# Patient Record
Sex: Female | Born: 1940
Health system: Southern US, Community
[De-identification: ages and names within clinical notes are randomized; demographics above are authoritative.]

## PROBLEM LIST (undated history)

## (undated) DIAGNOSIS — N183 Chronic kidney disease, stage 3 unspecified: Secondary | ICD-10-CM

## (undated) DIAGNOSIS — I10 Essential (primary) hypertension: Secondary | ICD-10-CM

## (undated) DIAGNOSIS — R55 Syncope and collapse: Secondary | ICD-10-CM

## (undated) DIAGNOSIS — I340 Nonrheumatic mitral (valve) insufficiency: Secondary | ICD-10-CM

## (undated) DIAGNOSIS — R040 Epistaxis: Secondary | ICD-10-CM

## (undated) DIAGNOSIS — Z9289 Personal history of other medical treatment: Secondary | ICD-10-CM

## (undated) DIAGNOSIS — I482 Chronic atrial fibrillation, unspecified: Secondary | ICD-10-CM

## (undated) DIAGNOSIS — M109 Gout, unspecified: Secondary | ICD-10-CM

## (undated) DIAGNOSIS — E785 Hyperlipidemia, unspecified: Secondary | ICD-10-CM

## (undated) HISTORY — PX: ABDOMINAL HYSTERECTOMY: SHX81

## (undated) HISTORY — DX: Syncope and collapse: R55

## (undated) HISTORY — DX: Chronic kidney disease, stage 3 unspecified: N18.30

## (undated) HISTORY — PX: TONSILLECTOMY: SUR1361

## (undated) HISTORY — PX: OOPHORECTOMY: SHX86

## (undated) HISTORY — PX: TOTAL VAGINAL HYSTERECTOMY: SHX2548

## (undated) HISTORY — DX: Nonrheumatic mitral (valve) insufficiency: I34.0

## (undated) HISTORY — DX: Personal history of other medical treatment: Z92.89

## (undated) HISTORY — DX: Chronic kidney disease, stage 3 (moderate): N18.3

## (undated) HISTORY — PX: APPENDECTOMY: SHX54

## (undated) HISTORY — DX: Chronic atrial fibrillation, unspecified: I48.20

---

## 2004-05-08 ENCOUNTER — Ambulatory Visit: Payer: Self-pay | Admitting: Gastroenterology

## 2009-11-13 ENCOUNTER — Ambulatory Visit: Payer: Self-pay | Admitting: Internal Medicine

## 2011-12-22 ENCOUNTER — Ambulatory Visit: Payer: Self-pay | Admitting: Internal Medicine

## 2012-05-16 ENCOUNTER — Ambulatory Visit: Payer: Self-pay | Admitting: Ophthalmology

## 2012-07-25 ENCOUNTER — Ambulatory Visit: Payer: Self-pay | Admitting: Ophthalmology

## 2013-01-04 ENCOUNTER — Ambulatory Visit: Payer: Self-pay | Admitting: Internal Medicine

## 2014-03-25 DIAGNOSIS — I10 Essential (primary) hypertension: Secondary | ICD-10-CM | POA: Insufficient documentation

## 2014-03-25 DIAGNOSIS — N951 Menopausal and female climacteric states: Secondary | ICD-10-CM | POA: Insufficient documentation

## 2014-03-25 DIAGNOSIS — E785 Hyperlipidemia, unspecified: Secondary | ICD-10-CM | POA: Insufficient documentation

## 2014-03-25 DIAGNOSIS — M109 Gout, unspecified: Secondary | ICD-10-CM | POA: Insufficient documentation

## 2014-04-25 ENCOUNTER — Ambulatory Visit: Payer: Self-pay | Admitting: Family Medicine

## 2014-11-04 DIAGNOSIS — I10 Essential (primary) hypertension: Secondary | ICD-10-CM | POA: Diagnosis not present

## 2014-11-25 DIAGNOSIS — E876 Hypokalemia: Secondary | ICD-10-CM | POA: Diagnosis not present

## 2014-11-25 DIAGNOSIS — Z79899 Other long term (current) drug therapy: Secondary | ICD-10-CM | POA: Diagnosis not present

## 2014-11-25 DIAGNOSIS — I10 Essential (primary) hypertension: Secondary | ICD-10-CM | POA: Diagnosis not present

## 2014-11-25 DIAGNOSIS — N183 Chronic kidney disease, stage 3 (moderate): Secondary | ICD-10-CM | POA: Diagnosis not present

## 2015-05-21 DIAGNOSIS — I10 Essential (primary) hypertension: Secondary | ICD-10-CM | POA: Diagnosis not present

## 2015-05-21 DIAGNOSIS — Z79899 Other long term (current) drug therapy: Secondary | ICD-10-CM | POA: Diagnosis not present

## 2015-05-29 DIAGNOSIS — I10 Essential (primary) hypertension: Secondary | ICD-10-CM | POA: Diagnosis not present

## 2015-05-29 DIAGNOSIS — N183 Chronic kidney disease, stage 3 (moderate): Secondary | ICD-10-CM | POA: Diagnosis not present

## 2015-05-29 DIAGNOSIS — J301 Allergic rhinitis due to pollen: Secondary | ICD-10-CM | POA: Diagnosis not present

## 2015-05-29 DIAGNOSIS — Z79899 Other long term (current) drug therapy: Secondary | ICD-10-CM | POA: Diagnosis not present

## 2015-11-20 DIAGNOSIS — N183 Chronic kidney disease, stage 3 (moderate): Secondary | ICD-10-CM | POA: Diagnosis not present

## 2015-11-20 DIAGNOSIS — I10 Essential (primary) hypertension: Secondary | ICD-10-CM | POA: Diagnosis not present

## 2015-11-20 DIAGNOSIS — Z79899 Other long term (current) drug therapy: Secondary | ICD-10-CM | POA: Diagnosis not present

## 2015-11-27 DIAGNOSIS — Z79899 Other long term (current) drug therapy: Secondary | ICD-10-CM | POA: Diagnosis not present

## 2015-11-27 DIAGNOSIS — N183 Chronic kidney disease, stage 3 unspecified: Secondary | ICD-10-CM | POA: Insufficient documentation

## 2015-11-27 DIAGNOSIS — Z0000 Encounter for general adult medical examination without abnormal findings: Secondary | ICD-10-CM | POA: Diagnosis not present

## 2015-11-27 DIAGNOSIS — I10 Essential (primary) hypertension: Secondary | ICD-10-CM | POA: Diagnosis not present

## 2016-04-07 ENCOUNTER — Emergency Department
Admission: EM | Admit: 2016-04-07 | Discharge: 2016-04-07 | Disposition: A | Discharge status: Home / Self Care | Payer: Commercial Managed Care - HMO | Attending: Emergency Medicine | Admitting: Emergency Medicine

## 2016-04-07 ENCOUNTER — Encounter: Payer: Self-pay | Admitting: Emergency Medicine

## 2016-04-07 DIAGNOSIS — R040 Epistaxis: Secondary | ICD-10-CM | POA: Insufficient documentation

## 2016-04-07 DIAGNOSIS — I10 Essential (primary) hypertension: Secondary | ICD-10-CM | POA: Diagnosis not present

## 2016-04-07 HISTORY — DX: Essential (primary) hypertension: I10

## 2016-04-07 MED ORDER — AMOXICILLIN-POT CLAVULANATE 875-125 MG PO TABS
1.0000 | ORAL_TABLET | Freq: Once | ORAL | Status: AC
  Administered 2016-04-07: 1 via ORAL
  Filled 2016-04-07: qty 1

## 2016-04-07 NOTE — ED Notes (Signed)
MD at bedside. 2 ml of air removed from nasal catheter.

## 2016-04-07 NOTE — ED Notes (Signed)
Nasal catheter came out of pt's nose. No bleeding at this time.

## 2016-04-07 NOTE — ED Notes (Signed)
Pt's nose began to bleed, MD administered air back into nose catheter.

## 2016-04-07 NOTE — ED Notes (Signed)
MD at bedside. 

## 2016-04-07 NOTE — ED Provider Notes (Signed)
[facility] Emergency Department Provider Note   First MD Initiated Contact with Patient 04/07/16 [id number]     (approximate)  I have reviewed the triage vital signs and the nursing notes.   HISTORY  Chief Complaint Epistaxis    HPI Paula Clark is a 75 y.o. female with history of hypertension presents to the emergency department via EMS with nosebleed. Patient admits to one hour of heavy bleeding from the right nostril. EMS gave aspirin in route however bleeding is still uncontrolled.   Past Medical History:  Diagnosis Date  . Hypertension     There are no active problems to display for this patient.   Past Surgical History:  Procedure Laterality Date  . APPENDECTOMY    . TONSILLECTOMY      Prior to Admission medications   Not on File    Allergies No known drug allergies No family history on file.  Social History Social History  Substance Use Topics  . Smoking status: Not on file  . Smokeless tobacco: Not on file  . Alcohol use Not on file    Review of Systems Constitutional: No fever/chills Eyes: No visual changes. ENT: No sore throat.Positive for nosebleeds Cardiovascular: Denies chest pain. Respiratory: Denies shortness of breath. Gastrointestinal: No abdominal pain.  No nausea, no vomiting.  No diarrhea.  No constipation. Genitourinary: Negative for dysuria. Musculoskeletal: Negative for back pain. Skin: Negative for rash. Neurological: Negative for headaches, focal weakness or numbness.  10-point ROS otherwise negative.  ____________________________________________   PHYSICAL EXAM:  VITAL SIGNS: ED Triage Vitals  Enc Vitals Group     BP 04/07/16 0154 (!) 194/92     Pulse Rate 04/07/16 0154 86     Resp 04/07/16 0154 18     Temp 04/07/16 0154 99.2 F (37.3 C)     Temp Source 04/07/16 0154 Oral     SpO2 04/07/16 0154 98 %     Weight 04/07/16 0151 140 lb (63.5 kg)     Height 04/07/16 0151 5\' 3"  (1.6 m)     Head  Circumference --      Peak Flow --      Pain Score --      Pain Loc --      Pain Edu? --      Excl. in [place]? --    Constitutional: Alert and oriented. Well appearing and in no acute distress. Eyes: Conjunctivae are normal. PERRL. EOMI. Head: Atraumatic. Nose: Brisk bleeding from the right nostril. Mouth/Throat: Mucous membranes are moist.  Oropharynx non-erythematous. Neck: No stridor.  No meningeal signs. Cardiovascular: Normal rate, regular rhythm. Good peripheral circulation. Grossly normal heart sounds. Respiratory: Normal respiratory effort.  No retractions. Lungs CTAB. Gastrointestinal: Soft and nontender. No distention.  Musculoskeletal: No lower extremity tenderness nor edema. No gross deformities of extremities. Neurologic:  Normal speech and language. No gross focal neurologic deficits are appreciated.  Skin:  Skin is warm, dry and intact. No rash noted. Psychiatric: Mood and affect are normal. Speech and behavior are normal.    PROCEDURES  Procedure(s) performed:   .Epistaxis Management Date/Time: 04/07/2016 10:30 PM Performed by: Gregor Hams Authorized by: Gregor Hams   Consent:    Consent obtained:  Verbal   Consent given by:  Patient   Risks discussed:  Infection, nasal injury and pain   Alternatives discussed:  Alternative treatment Anesthesia (see MAR for exact dosages):    Anesthesia method:  None Procedure details:    Treatment site:  R anterior   Treatment method:  Nasal balloon   Treatment complexity:  Extensive   Treatment episode: initial   Post-procedure details:    Assessment:  Bleeding stopped   Patient tolerance of procedure:  Tolerated well, no immediate complications       INITIAL IMPRESSION / ASSESSMENT AND PLAN / ED COURSE  Pertinent labs & imaging results that were available during my care of the patient were reviewed by me and considered in my medical decision making (see chart for details).  At approximately 3:30 AM  nasal balloon dislodged on its own nostril inspected with stable clot noted at the anterior nasal septum no evidence of bleeding at this time as such nasal balloon not reinserted.   Clinical Course    ____________________________________________  FINAL CLINICAL IMPRESSION(S) / ED DIAGNOSES  Final diagnoses:  Anterior epistaxis     MEDICATIONS GIVEN DURING THIS VISIT:  Medications  amoxicillin-clavulanate (AUGMENTIN) 875-125 MG per tablet 1 tablet (1 tablet Oral Given 04/07/16 0302)     NEW OUTPATIENT MEDICATIONS STARTED DURING THIS VISIT:  New Prescriptions   No medications on file    Modified Medications   No medications on file    Discontinued Medications   No medications on file     Note:  This document was prepared using Dragon voice recognition software and may include unintentional dictation errors.    Gregor Hams, MD 04/07/16 2232

## 2016-04-07 NOTE — ED Triage Notes (Signed)
Pt arrived via ems from home with complaints of uncontrolled bleeding beginning an hour ago. EMS gave afrin en route, bleeding is uncontrolled upon assessment. MD at bedside.

## 2016-04-07 NOTE — ED Notes (Addendum)
Bleeding remains controlled.

## 2016-04-09 DIAGNOSIS — R040 Epistaxis: Secondary | ICD-10-CM | POA: Diagnosis not present

## 2016-04-11 ENCOUNTER — Observation Stay
Admission: EM | Admit: 2016-04-11 | Discharge: 2016-04-15 | Disposition: A | Discharge status: Home / Self Care | Payer: Commercial Managed Care - HMO | Attending: Internal Medicine | Admitting: Internal Medicine

## 2016-04-11 DIAGNOSIS — Z7982 Long term (current) use of aspirin: Secondary | ICD-10-CM | POA: Insufficient documentation

## 2016-04-11 DIAGNOSIS — I10 Essential (primary) hypertension: Secondary | ICD-10-CM | POA: Insufficient documentation

## 2016-04-11 DIAGNOSIS — R2689 Other abnormalities of gait and mobility: Secondary | ICD-10-CM

## 2016-04-11 DIAGNOSIS — I4891 Unspecified atrial fibrillation: Secondary | ICD-10-CM

## 2016-04-11 DIAGNOSIS — Z87891 Personal history of nicotine dependence: Secondary | ICD-10-CM | POA: Diagnosis not present

## 2016-04-11 DIAGNOSIS — D649 Anemia, unspecified: Secondary | ICD-10-CM | POA: Diagnosis not present

## 2016-04-11 DIAGNOSIS — R05 Cough: Secondary | ICD-10-CM | POA: Diagnosis not present

## 2016-04-11 DIAGNOSIS — I481 Persistent atrial fibrillation: Secondary | ICD-10-CM | POA: Diagnosis not present

## 2016-04-11 DIAGNOSIS — E785 Hyperlipidemia, unspecified: Secondary | ICD-10-CM | POA: Diagnosis not present

## 2016-04-11 DIAGNOSIS — I160 Hypertensive urgency: Secondary | ICD-10-CM | POA: Diagnosis not present

## 2016-04-11 DIAGNOSIS — R040 Epistaxis: Secondary | ICD-10-CM | POA: Diagnosis not present

## 2016-04-11 DIAGNOSIS — E876 Hypokalemia: Secondary | ICD-10-CM

## 2016-04-11 DIAGNOSIS — R531 Weakness: Secondary | ICD-10-CM

## 2016-04-11 DIAGNOSIS — I4819 Other persistent atrial fibrillation: Secondary | ICD-10-CM

## 2016-04-11 DIAGNOSIS — R059 Cough, unspecified: Secondary | ICD-10-CM

## 2016-04-11 HISTORY — DX: Hyperlipidemia, unspecified: E78.5

## 2016-04-11 HISTORY — DX: Epistaxis: R04.0

## 2016-04-11 HISTORY — DX: Gout, unspecified: M10.9

## 2016-04-11 LAB — CBC
HEMATOCRIT: 34.5 % — AB (ref 35.0–47.0)
HEMOGLOBIN: 11.9 g/dL — AB (ref 12.0–16.0)
MCH: 30.7 pg (ref 26.0–34.0)
MCHC: 34.5 g/dL (ref 32.0–36.0)
MCV: 89.1 fL (ref 80.0–100.0)
Platelets: 404 10*3/uL (ref 150–440)
RBC: 3.88 MIL/uL (ref 3.80–5.20)
RDW: 14.3 % (ref 11.5–14.5)
WBC: 5.5 10*3/uL (ref 3.6–11.0)

## 2016-04-11 MED ORDER — AMOXICILLIN-POT CLAVULANATE 875-125 MG PO TABS
1.0000 | ORAL_TABLET | Freq: Once | ORAL | Status: AC
  Administered 2016-04-11: 1 via ORAL
  Filled 2016-04-11: qty 1

## 2016-04-11 MED ORDER — PHENYLEPHRINE HCL 10 % OP SOLN
Freq: Once | OPHTHALMIC | Status: DC
  Filled 2016-04-11: qty 10

## 2016-04-11 MED ORDER — SILVER NITRATE-POT NITRATE 75-25 % EX MISC
1.0000 | Freq: Once | CUTANEOUS | Status: DC
  Filled 2016-04-11: qty 1

## 2016-04-11 MED ORDER — PHENYLEPHRINE HCL 0.5 % NA SOLN
2.0000 [drp] | Freq: Once | NASAL | Status: AC
  Administered 2016-04-11: 18:00:00 via NASAL

## 2016-04-11 MED ORDER — AMOXICILLIN-POT CLAVULANATE 875-125 MG PO TABS: 1.0000 | ORAL_TABLET | Freq: Two times a day (BID) | ORAL | 0 refills | Status: DC

## 2016-04-11 MED ORDER — PHENYLEPHRINE HCL 0.5 % NA SOLN
NASAL | Status: AC
  Filled 2016-04-11: qty 15

## 2016-04-11 MED ORDER — OXYMETAZOLINE HCL 0.05 % NA SOLN
NASAL | Status: AC
  Filled 2016-04-11: qty 15

## 2016-04-11 NOTE — ED Notes (Signed)
Norman MD at bedside 

## 2016-04-11 NOTE — ED Triage Notes (Signed)
Pt presents via POV c/o epistaxis recurrent. Pt reports seen in ED last Tuesday with same complaints with bleeding controlled. Reports this morning at apx 0500 woke up with blood dripping down throat per pt report. Pt reports intermittent bleeding today unable to control.

## 2016-04-11 NOTE — ED Notes (Signed)
Report received from hunter. Pt ordered an antibiotic. Nosebleed currently controlled with nasal tampon

## 2016-04-11 NOTE — ED Provider Notes (Addendum)
[facility] Emergency Department Provider Note  ____________________________________________  Time seen: Approximately 6:20 PM  I have reviewed the triage vital signs and the nursing notes.   HISTORY  Chief Complaint Epistaxis    HPI Paula Clark is a 75 y.o. female a history of hypertension and recurrent epistaxis, on low-dose aspirin but not otherwise anticoagulated, presenting with epistaxis 4 today. The patient was seen here 04/07/16 with epistaxis, and her packing fell out while she was here, with no further bleeding so she was discharged home with ENT follow-up. She saw Dr. Vaughan Basta discharged her home with a plan for follow-up and possible cauterization, but none indicated at the time of her visit. This morning, she woke up at 5 AM with a nosebleed that was stopped with 20 minutes of manual pressure. Since then, she has had 3 additional nosebleeds, and the last one did not stop. The blood is coming out the left nare, minimal drainage into the throat.  She reports generalized weakness, but otherwise has not had any lightheadedness, syncope, or shortness of breath. No fevers.  The pt denies auto-trauma, or blowing her nose.   Past Medical History:  Diagnosis Date  . Hypertension     There are no active problems to display for this patient.   Past Surgical History:  Procedure Laterality Date  . APPENDECTOMY    . TONSILLECTOMY        Allergies Review of patient's allergies indicates no known allergies.  History reviewed. No pertinent family history.  Social History Social History  Substance Use Topics  . Smoking status: Former Games developer  . Smokeless tobacco: Never Used  . Alcohol use No    Review of Systems Constitutional: No fever/chills.  Lightheadedness or syncope. Eyes: No visual changes. ENT: No sore throat. No congestion or rhinorrhea. Positive epistaxis in the right [facility]. Cardiovascular: Denies chest pain. Denies  palpitations. Respiratory: Denies shortness of breath.  No cough. Gastrointestinal: No abdominal pain.  No nausea, no vomiting.  No diarrhea.  No constipation. Skin: Negative for rash. Neurological: Negative for headaches. No focal numbness, tingling or weakness.   10-point ROS otherwise negative.  ____________________________________________   PHYSICAL EXAM:  VITAL SIGNS: ED Triage Vitals [04/11/16 1804]  Enc Vitals Group     BP      Pulse      Resp      Temp      Temp src      SpO2      Weight 145 lb (65.8 kg)     Height  (1.6 m)     Head Circumference      Peak Flow      Pain Score      Pain Loc      Pain Edu?      Excl. in [place]?     Constitutional: Alert and oriented. Well appearing and in no acute distress. Answers questions appropriately. Eyes: Conjunctivae are normal.  EOMI. No scleral icterus. Head: Atraumatic. Nose: No congestion/rhinnorhea.Positive bleeding from the septum in the right there, which is moderate in flow and therefore I am unable to see the mucous membrane. No bleeding from the left neck. No obvious blood in the posterior pharynx. Mouth/Throat: Mucous membranes are moist.  Neck: No stridor.  Supple.   Cardiovascular: Fast rate Respiratory: Normal respiratory effort.  Musculoskeletal: No LE edema.  Neurologic:  A&Ox3.  Speech is clear.  Face and smile are symmetric.  EOMI.  Moves all extremities well. Skin:  Skin is  warm, dry and intact. No rash noted. Psychiatric: Mood and affect are normal. Speech and behavior are normal.  Normal judgement.  ____________________________________________   LABS (all labs ordered are listed, but only abnormal results are displayed)  Labs Reviewed  CBC   ____________________________________________  EKG ED ECG REPORT I, Eula Listen, the attending physician, personally viewed and interpreted this ECG.   Date: 04/11/2016  EKG Time: 1901  Rate: 89  Rhythm: atrial fibrillation  Axis: normal   Intervals:none  ST&T Change: No STEMI  ____________________________________________  RADIOLOGY  No results found.  ____________________________________________   PROCEDURES  Procedure(s) performed: None  Procedures  Critical Care performed: No ____________________________________________   INITIAL IMPRESSION / ASSESSMENT AND PLAN / ED COURSE  Pertinent labs & imaging results that were available during my care of the patient were reviewed by me and considered in my medical decision making (see chart for details).  75 y.o. female with a history of recurrent epistaxis presenting with epistaxis that was not treatable with direct pressure. This likely is an anterior nosebleed, and I do not see evidence of a posterior nosebleed. She does have some generalized weakness, and we will check her hemoglobin and hematocrit, but I suspect that the likelihood of acute anemia is very low. On arrival to the emergency department, the patient had a gauze in the right [place] Adam with nasal clips, and on removal, a large blood clot was removed and bleeding resume. We immediately applied pressure, had the patient blow her nose, and treated her with Neo-Synephrine. In addition, I placed a Rhino Rocket in the right nare with minimal leakage of blood around the packing. Given that she is not otherwise and a quite regularly, we will await 15 minutes to see if the bleeding around the packing stops, and if it does not I will remove it and pack her with Murocel.  She will need to go home on Augmentin, which she has tolerated before.  Procedure: Bleeding obvious in the right nare anteriorly; Ant [employer] place w/ 5cc air.  Pt tolerated procedure well. She did have some minimal leakage around the [employer] after completion.  ____________________________________________  FINAL CLINICAL IMPRESSION(S) / ED DIAGNOSES  Final diagnoses:  None    Clinical Course  Comment By Time  This time, the patient does not  appear to have any oozing or bleeding around the [employer]. If her laboratory studies are reassuring, her heart rate has normalized, and her EKG is normal, we'll plan to discharge home. Give her first dose of Augmentin here, and a prescription to go home with. Eula Listen, MD 10/01 1901    ----------------------------------------- 7:28 PM on 04/11/2016 -----------------------------------------  According to the patient's laboratory studies from care everywhere, her baseline hematocrit is 12.4. Today she is 11.9, which is only minimally lower. Her vital signs are stable, and she does not meet criteria for transfusion or admission. Plan discharge at this time. Return precautions as well as follow-up instructions were discussed, including antibiotic use to prevent infection.  Today's EKG does show that the patient has atrial fibrillation, which she states she has had before but is not anticoagulated. Unfortunate, we do not have previous EKGs in our system here. She takes a low-dose aspirin daily, but held it today due to the bleeding. She does not have a primary cardiologist. Given that the patient has had atrial fibrillation in the past, and is well rate controlled, and is asymptomatic, she will not require admission to the hospital. In addition, she is  not a candidate to initiate anticoagulation today given her multiple episodes of epistaxis. However, I do think that if she has no cardiologist and has not been in atrial fibrillation recently, she should be reestablished. I have spoken with Dr. Chancy Milroy, who will see the patient on Tuesday at 1 PM. The family and the patient are aware and will follow-up.  At this time, I'll plan to discharge the patient home. Return precautions and follow-up instructions were discussed.  ----------------------------------------- 8:35 PM on 04/11/2016 -----------------------------------------  The patient was discharged without any evidence of acute bleeding,  and when she got in the car she turned her head and began to have some oozing around the [employer] site. On my reevaluation, she did have some minimal oozing, so I deflated the balloon and removed the [employer]. We cleared her nose again, and a placed Merocel in the right nare without any complications. I have additionally added a clip and will await 20 minutes to reevaluate the patient. At that time, if she continues to bleed, I will page ENT. If not, she'll be discharged home with the same previous plan for ENT follow-up as well as antibiotics.  ----------------------------------------- 11:14 PM on 04/11/2016 -----------------------------------------  The patient remains hemodynamically stable at this time. Unfortunately, the patient now has started oozing through her Merocel as well. I will call ENT for further treatment.  The patient has been signed out to Dr. Dahlia Client, who will follow with ENT for final treatment and disposition.  NEW MEDICATIONS STARTED DURING THIS VISIT:  New Prescriptions   No medications on file      Eula Listen, MD 04/11/16 1939    Eula Listen, MD 04/11/16 [id number]    Eula Listen, MD 04/11/16 2336

## 2016-04-11 NOTE — ED Notes (Signed)
Pt was discharged and while being placed in the car, turned her head and her nose started bleeding again. Pt brought back to room 24 and dr. Mariea Clonts made aware

## 2016-04-12 ENCOUNTER — Observation Stay: Payer: Commercial Managed Care - HMO

## 2016-04-12 DIAGNOSIS — R040 Epistaxis: Secondary | ICD-10-CM | POA: Diagnosis not present

## 2016-04-12 DIAGNOSIS — I160 Hypertensive urgency: Secondary | ICD-10-CM | POA: Diagnosis present

## 2016-04-12 DIAGNOSIS — R05 Cough: Secondary | ICD-10-CM | POA: Diagnosis not present

## 2016-04-12 DIAGNOSIS — I10 Essential (primary) hypertension: Secondary | ICD-10-CM | POA: Diagnosis not present

## 2016-04-12 LAB — CBC
HEMATOCRIT: 33.1 % — AB (ref 35.0–47.0)
Hemoglobin: 11.5 g/dL — ABNORMAL LOW (ref 12.0–16.0)
MCH: 30.2 pg (ref 26.0–34.0)
MCHC: 34.7 g/dL (ref 32.0–36.0)
MCV: 87 fL (ref 80.0–100.0)
Platelets: 417 10*3/uL (ref 150–440)
RBC: 3.8 MIL/uL (ref 3.80–5.20)
RDW: 14.6 % — ABNORMAL HIGH (ref 11.5–14.5)
WBC: 6.1 10*3/uL (ref 3.6–11.0)

## 2016-04-12 LAB — BASIC METABOLIC PANEL
Anion gap: 7 (ref 5–15)
BUN: 14 mg/dL (ref 6–20)
CHLORIDE: 110 mmol/L (ref 101–111)
CO2: 23 mmol/L (ref 22–32)
Calcium: 9.2 mg/dL (ref 8.9–10.3)
Creatinine, Ser: 1.03 mg/dL — ABNORMAL HIGH (ref 0.44–1.00)
GFR calc non Af Amer: 51 mL/min — ABNORMAL LOW (ref >60)
GFR, EST AFRICAN AMERICAN: 59 mL/min — AB (ref >60)
Glucose, Bld: 122 mg/dL — ABNORMAL HIGH (ref 65–99)
POTASSIUM: 3.6 mmol/L (ref 3.5–5.1)
SODIUM: 140 mmol/L (ref 135–145)

## 2016-04-12 MED ORDER — ACETAMINOPHEN 650 MG RE SUPP: 650.0000 mg | Freq: Four times a day (QID) | RECTAL | Status: DC | PRN

## 2016-04-12 MED ORDER — AMLODIPINE BESYLATE 5 MG PO TABS
5.0000 mg | ORAL_TABLET | Freq: Every day | ORAL | Status: DC
  Administered 2016-04-12: 5 mg via ORAL
  Filled 2016-04-12: qty 1

## 2016-04-12 MED ORDER — MAGNESIUM CITRATE PO SOLN: 1.0000 | Freq: Once | ORAL | Status: DC | PRN

## 2016-04-12 MED ORDER — HYDROCHLOROTHIAZIDE 12.5 MG PO CAPS
12.5000 mg | ORAL_CAPSULE | Freq: Every day | ORAL | Status: DC
  Administered 2016-04-12: 12.5 mg via ORAL
  Filled 2016-04-12: qty 1

## 2016-04-12 MED ORDER — BISACODYL 5 MG PO TBEC: 5.0000 mg | DELAYED_RELEASE_TABLET | Freq: Every day | ORAL | Status: DC | PRN

## 2016-04-12 MED ORDER — HYDROCODONE-ACETAMINOPHEN 5-325 MG PO TABS: 1.0000 | ORAL_TABLET | ORAL | Status: DC | PRN

## 2016-04-12 MED ORDER — METOPROLOL SUCCINATE ER 50 MG PO TB24
100.0000 mg | ORAL_TABLET | Freq: Every day | ORAL | Status: DC
  Administered 2016-04-12 – 2016-04-13 (×2): 100 mg via ORAL
  Filled 2016-04-12 (×2): qty 2

## 2016-04-12 MED ORDER — ACETAMINOPHEN 325 MG PO TABS
650.0000 mg | ORAL_TABLET | Freq: Four times a day (QID) | ORAL | Status: DC | PRN
  Administered 2016-04-12 – 2016-04-14 (×4): 650 mg via ORAL
  Filled 2016-04-12 (×4): qty 2

## 2016-04-12 MED ORDER — ONDANSETRON HCL 4 MG/2ML IJ SOLN: 4.0000 mg | Freq: Four times a day (QID) | INTRAMUSCULAR | Status: DC | PRN

## 2016-04-12 MED ORDER — SODIUM CHLORIDE 0.9% FLUSH
3.0000 mL | Freq: Two times a day (BID) | INTRAVENOUS | Status: DC
  Administered 2016-04-12 – 2016-04-15 (×4): 3 mL via INTRAVENOUS

## 2016-04-12 MED ORDER — HYDRALAZINE HCL 20 MG/ML IJ SOLN
2.0000 mg | Freq: Once | INTRAMUSCULAR | Status: AC
  Administered 2016-04-12: 2 mg via INTRAVENOUS
  Filled 2016-04-12: qty 1

## 2016-04-12 MED ORDER — ZOLPIDEM TARTRATE 5 MG PO TABS: 5.0000 mg | ORAL_TABLET | Freq: Every evening | ORAL | Status: DC | PRN

## 2016-04-12 MED ORDER — CALCIUM CARBONATE-VITAMIN D 500-200 MG-UNIT PO TABS
1.0000 | ORAL_TABLET | Freq: Every day | ORAL | Status: DC
  Administered 2016-04-12 – 2016-04-15 (×4): 1 via ORAL
  Filled 2016-04-12 (×4): qty 1

## 2016-04-12 MED ORDER — METOPROLOL TARTRATE 5 MG/5ML IV SOLN
5.0000 mg | Freq: Once | INTRAVENOUS | Status: AC
  Administered 2016-04-12: 5 mg via INTRAVENOUS
  Filled 2016-04-12: qty 5

## 2016-04-12 MED ORDER — SODIUM CHLORIDE 0.9 % IV SOLN
INTRAVENOUS | Status: DC
  Administered 2016-04-12 (×2): via INTRAVENOUS

## 2016-04-12 MED ORDER — ONDANSETRON HCL 4 MG PO TABS: 4.0000 mg | ORAL_TABLET | Freq: Four times a day (QID) | ORAL | Status: DC | PRN

## 2016-04-12 MED ORDER — SENNOSIDES-DOCUSATE SODIUM 8.6-50 MG PO TABS: 1.0000 | ORAL_TABLET | Freq: Every evening | ORAL | Status: DC | PRN

## 2016-04-12 MED ORDER — DOXAZOSIN MESYLATE 4 MG PO TABS
8.0000 mg | ORAL_TABLET | Freq: Every day | ORAL | Status: DC
  Administered 2016-04-12 – 2016-04-15 (×4): 8 mg via ORAL
  Filled 2016-04-12 (×4): qty 2

## 2016-04-12 NOTE — Care Management Note (Signed)
Case Management Note  Patient Details  Name: LANORE RENDEROS MRN: [id number] Date of Birth: [birth year]  Subjective/Objective:                   Met with patient and her daughter around 74AM today to explain MOON letter (Medicare OBServation). I returned at 11:45AM and patient was sleeping and had not signed MOON. Per daughter patient did not feel well enough to read form and asked that I go over it again which I did. Copy left with daughter. Per daughter patient has been having nose bleeds for about a week now and blood pressure problem (high and low) for over 5 months. She states her PCP is with [facility] and "cannot get her cardiac medications right".  Daughter wants her to see a cardiologist. She has never had home health services.   Action/Plan:   I advised daughter to call and cancel her PCP appointment that patient had today and reschedule it for about 7-10 days out. I advised here to  ask about cardiology referral for a-fib.   Expected Discharge Date:                  Expected Discharge Plan:     In-House Referral:     Discharge planning Services  CM Consult  Post Acute Care Choice:    Choice offered to:  Adult Children, Patient  DME Arranged:    DME Agency:     HH Arranged:    HH Agency:     Status of Service:  In process, will continue to follow  If discussed at Long Length of Stay Meetings, dates discussed:    Additional Comments:  Marshell Garfinkel, RN 04/12/2016, 11:52 AM

## 2016-04-12 NOTE — H&P (Addendum)
Paula Clark @ [facility] Admission History and Physical [employer], D.O.  ---------------------------------------------------------------------------------------------------------------------   PATIENT NAME: Paula Clark MR#: [id number] DATE OF BIRTH: July 05, 1941 DATE OF ADMISSION: 04/11/2016 PRIMARY CARE PHYSICIAN: No PCP Per Patient  REQUESTING/REFERRING PHYSICIAN: ED Dr. Dahlia Client  CHIEF COMPLAINT: Chief Complaint  Patient presents with  . Epistaxis   epistaxis  HISTORY OF PRESENT ILLNESS: Paula Clark is a 75 y.o. female with a known history of hypertension presents to the emergency department complaining of nosebleed.  Patient was in a usual state of health until Last week when she developed epistaxis. She was seen in the emergency department, packed and discharged home with ENT follow-up. She reports a nosebleed this morning that stopped with manual pressure but has since had 3 additional nosebleeds which did not stop prompting an emergency department visit today. She was seen again, discharged again but when she got in her car, bleeding resumed.  She was seen in the emergency department by ENT who packed her nose and recommended admission for observation, BP control and consideration of cauterization if bleeding persists once she is normotensive.  My questioning she reports a mild cough associated with dripping down her throat, generalized weakness. Of note she reports that her blood pressure medications have been lowered about 5 months ago because her blood pressure was dropping too low.  Otherwise there has been no change in status. Patient has been taking medication as prescribed and there has been no recent change in medication or diet.  There has been no recent illness, travel or sick contacts.    Patient denies fevers/chills, dizziness, chest pain, shortness of breath, N/V/C/D, abdominal pain, dysuria/frequency, changes in mental status.    PAST MEDICAL  HISTORY: Past Medical History:  Diagnosis Date  . Hypertension       PAST SURGICAL HISTORY: Past Surgical History:  Procedure Laterality Date  . APPENDECTOMY    . TONSILLECTOMY        SOCIAL HISTORY: Social History  Substance Use Topics  . Smoking status: Former Research scientist (life sciences)  . Smokeless tobacco: Never Used  . Alcohol use No      FAMILY HISTORY: History reviewed. No pertinent family history.   MEDICATIONS AT HOME: Prior to Admission medications   Medication Sig Start Date End Date Taking? Authorizing Provider  aspirin EC 81 MG tablet Take 81 mg by mouth daily.   Yes Historical Provider, MD  Calcium Carbonate-Vitamin D 600-400 MG-UNIT tablet Take 1 tablet by mouth daily.   Yes Historical Provider, MD  doxazosin (CARDURA) 8 MG tablet Take 1 tablet by mouth daily. 03/01/16  Yes Historical Provider, MD  metoprolol succinate (TOPROL-XL) 50 MG 24 hr tablet Take 1 tablet by mouth daily. 02/19/16  Yes Historical Provider, MD  amoxicillin-clavulanate (AUGMENTIN) 875-125 MG tablet Take 1 tablet by mouth every 12 (twelve) hours. 04/11/16 04/21/16  Eula Listen, MD      DRUG ALLERGIES: No Known Allergies   REVIEW OF SYSTEMS: CONSTITUTIONAL: Positive weakness, no fever, chills, weight gain/loss, headache EYES: No blurry or double vision.Positive nosebleed.  ENT: No tinnitus, postnasal drip, redness or soreness of the oropharynx. RESPIRATORY: No dyspnea, positive mild cough, wheeze, hemoptysis. CARDIOVASCULAR: No chest pain, orthopnea, palpitations, syncope. GASTROINTESTINAL: No nausea, vomiting, constipation, diarrhea, abdominal pain. No hematemesis, melena or hematochezia. GENITOURINARY: No dysuria, frequency, hematuria. ENDOCRINE: No polyuria or nocturia. No heat or cold intolerance. HEMATOLOGY: No anemia, bruising, bleeding. INTEGUMENTARY: No rashes, ulcers, lesions. MUSCULOSKELETAL: No pain, arthritis, swelling, gout. NEUROLOGIC: No numbness, tingling, weakness or ataxia.  No seizure-type activity. PSYCHIATRIC: No anxiety, depression, insomnia.  PHYSICAL EXAMINATION: VITAL SIGNS: Blood pressure (!) 170/91, pulse 78, temperature 99 F (37.2 C), temperature source Oral, resp. rate 20, height 5\' 3"  (1.6 m), weight 65.8 kg (145 lb), SpO2 98 %.  GENERAL: 75 y.o.-year-old white female  patient, well-developed, well-nourished lying in the bed in no acute distress.  Pleasant and cooperative.   HEENT: Head atraumatic, normocephalic. Pupils equal, round, reactive to light and accommodation. No scleral icterus. Extraocular muscles intact. Oropharynx is clear. Mucus membranes moist. Right nare is packed and there is a nasal clamp NECK: Supple, full range of motion. No JVD, no bruit heard. No cervical lymphadenopathy. CHEST: Normal breath sounds bilaterally. No wheezing, rales, rhonchi or crackles. No use of accessory muscles of respiration.  No reproducible chest wall tenderness.  CARDIOVASCULAR: S1, S2 normal. No murmurs, rubs, or gallops appreciated. Cap refill <2 seconds. ABDOMEN: Soft, nontender, nondistended. No rebound, guarding, rigidity. Normoactive bowel sounds present in all four quadrants. No organomegaly or mass. EXTREMITIES: Full range of motion. No pedal edema, cyanosis, or clubbing. NEUROLOGIC: Cranial nerves II through XII are grossly intact with no focal sensorimotor deficit. Muscle strength 5/5 in all extremities. Sensation intact. Gait not checked. PSYCHIATRIC: The patient is alert and oriented x 3. Normal affect, mood, thought content. SKIN: Warm, dry, and intact without obvious rash, lesion, or ulcer.  LABORATORY PANEL:  CBC  Recent Labs Lab 04/11/16 1853  WBC 5.5  HGB 11.9*  HCT 34.5*  PLT 404   ----------------------------------------------------------------------------------------------------------------- Chemistries No results for input(s): NA, K, CL, CO2, GLUCOSE, BUN, CREATININE, CALCIUM, MG, AST, ALT, ALKPHOS, BILITOT in the last 168  hours.  Invalid input(s): GFRCGP ------------------------------------------------------------------------------------------------------------------ Cardiac Enzymes No results for input(s): TROPONINI in the last 168 hours. ------------------------------------------------------------------------------------------------------------------  RADIOLOGY: No results found.   IMPRESSION AND PLAN:  This is a 75 y.o. female with a history of Hypertension, recurrent epistaxis now being admitted with: 1. Hypertensive urgency -admit for observation, BP control and ENT consult. We'll continue her Cardura, increase her Toprol XL to 100 mg daily and monitor her on telemetry.  I will order a one time dose of hydralazine now.   2. Intractable epistaxis-packed by ENT. I will request an EKG and chest x-ray as well for baseline for admission for preoperative evaluation for consideration of cauterization. Hold aspirin for tonight and resume when safe.  3. Cough-likely secondary to postnasal drip. We'll monitor  Please note there is documentation of an EKG with atrial fibrillation - I did not see this EKG, but have ordered a repeat as this would be a new diagnosis for the patient and may be contributing to her increased BP.    Diet/Nutrition: Nothing by mouth pending possible OR Fluids:IV normal saline DVT Px: SCDs and early ambulation Code Status: Full  All the records are reviewed and case discussed with ED provider. Management plans discussed with the patient and/or family who express understanding and agree with plan of care.   TOTAL TIME TAKING CARE OF THIS PATIENT: 60 minutes.   Paula Clark D.O. on 04/12/2016 at 1:39 AM Between 7am to 6pm - Pager - [phone] After 6pm go to [email or web address] - [employer] [facility] Office [phone] CC: Primary care physician; No PCP Per Patient     Note: This dictation was prepared with Dragon dictation along with  smaller phrase technology. Any transcriptional errors that result from this process are unintentional.

## 2016-04-12 NOTE — ED Provider Notes (Signed)
-----------------------------------------   1:10 AM on 04/12/2016 -----------------------------------------   Blood pressure (!) 167/87, pulse (!) 103, resp. rate 18, height 5\' 3"  (1.6 m), weight 145 lb (65.8 kg), SpO2 97 %.  Assuming care from Dr. Mariea Clonts.  In short, Paula Clark is a 75 y.o. female with a chief complaint of Epistaxis .  Refer to the original H&P for additional details.  The current plan of care is to follow up the recommendations by the ENT specialist.  The patient was seen by Dr. Kathyrn Sheriff. He feels that the patient has an anterior nosebleed and some of this bleeding is due to exertion and her elevated blood pressure. He would like for me to give her some medication to lower her blood pressure as well as admit her for observation overnight. He wants [name] to continue to work on her blood pressure and observe her overnight. I will give the patient a dose of Lopressor 5 mg IV 1 and I will admit her to the hospitalist service. After the initial dose of Lopressor the patient's blood pressure did not change. She will receive a second dose of Lopressor.    Loney Hering, MD 04/12/16 [phone]

## 2016-04-12 NOTE — Consult Note (Signed)
Paula Clark, Paula Clark [id number] 04-Oct-1940 Vaughan Basta, MD  Reason for Consult: Epistaxis  HPI: Patient was seen last night emergency room and taken care of for epistaxis. She had bleeding finally controlled with packing and digital pressure on her anterior nose. Her bleeding has been coming from the anterior septum on the right side. Her blood pressure was uncontrolled she was admitted in the hospital last night and then on further blood pressure medications. I see this morning she feels lousy and is noted that her last blood pressure was 200/100. She has remained with packing in and a clip on her nose and has not done any further bleeding since last night.  Allergies: No Known Allergies  ROS: Review of systems normal other than 12 systems except per HPI.  PMH:  Past Medical History:  Diagnosis Date  . Hypertension     FH: History reviewed. No pertinent family history.  SH:  Social History   Social History  . Marital status: Widowed    Spouse name: N/A  . Number of children: N/A  . Years of education: N/A   Occupational History  . Not on file.   Social History Main Topics  . Smoking status: Former Research scientist (life sciences)  . Smokeless tobacco: Never Used  . Alcohol use No  . Drug use: No  . Sexual activity: Not on file   Other Topics Concern  . Not on file   Social History Narrative  . No narrative on file    PSH:  Past Surgical History:  Procedure Laterality Date  . APPENDECTOMY    . TONSILLECTOMY      Physical  Exam: Patient has the Merocel sponge in her right nostril and a nasal clip on the tip of the nose. She is not doing any active bleeding from her nose nor down the back of her throat. There is no swelling in her face or any signs of infection.   A/P: She has uncontrolled hypertension currently that is been feeling this nosebleed. The packing and nasal clip are working okay right now and once the blood pressure is better controlled the nasal clip removed. We'll plan to  keep the pack in all week long and to see her in the office Friday morning for packing removal. Until the blood pressure is controlled she will likely continue to have some nosebleed problems. She may need stay in the hospital little longer to make sure the blood pressure is managed well. I will plan to see her in the office Friday but call me back for any further signs of uncontrolled epistaxis in the meantime.   Edward Guthmiller H 04/12/2016 9:30 AM

## 2016-04-12 NOTE — Progress Notes (Signed)
[facility] [place] NAME: Paula Clark    MR#:  [id number]  DATE OF BIRTH:  Mar 01, 1941  SUBJECTIVE:  CHIEF COMPLAINT:   Chief Complaint  Patient presents with  . Epistaxis     Came with repeated Epistaxis and uncontrolled hypertension.   Have bleed from nose on coughing.  REVIEW OF SYSTEMS:  CONSTITUTIONAL: No fever, fatigue or weakness.  EYES: No blurred or double vision.  EARS, NOSE, AND THROAT: No tinnitus or ear pain.  RESPIRATORY: No cough, shortness of breath, wheezing or hemoptysis.  CARDIOVASCULAR: No chest pain, orthopnea, edema.  GASTROINTESTINAL: No nausea, vomiting, diarrhea or abdominal pain.  GENITOURINARY: No dysuria, hematuria.  ENDOCRINE: No polyuria, nocturia,  HEMATOLOGY: No anemia, easy bruising or bleeding SKIN: No rash or lesion. MUSCULOSKELETAL: No joint pain or arthritis.   NEUROLOGIC: No tingling, numbness, weakness.  PSYCHIATRY: No anxiety or depression.   ROS  DRUG ALLERGIES:  No Known Allergies  VITALS:  Blood pressure (!) 181/75, pulse 81, temperature 98.7 F (37.1 C), temperature source Oral, resp. rate 20, height 5\' 3"  (1.6 m), weight 65.9 kg (145 lb 3.2 oz), SpO2 94 %.  PHYSICAL EXAMINATION:  GENERAL:  75 y.o.-year-old patient lying in the bed with no acute distress.  EYES: Pupils equal, round, reactive to light and accommodation. No scleral icterus. Extraocular muscles intact.  HEENT: Head atraumatic, normocephalic. Oropharynx clear, nasal packing- stained with blood present. NECK:  Supple, no jugular venous distention. No thyroid enlargement, no tenderness.  LUNGS: Normal breath sounds bilaterally, no wheezing, rales,rhonchi or crepitation. No use of accessory muscles of respiration.  CARDIOVASCULAR: S1, S2 normal. No murmurs, rubs, or gallops.  ABDOMEN: Soft, nontender, nondistended. Bowel sounds present. No organomegaly or mass.  EXTREMITIES: No pedal edema, cyanosis, or clubbing.   NEUROLOGIC: Cranial nerves II through XII are intact. Muscle strength 5/5 in all extremities. Sensation intact. Gait not checked.  PSYCHIATRIC: The patient is alert and oriented x 3.  SKIN: No obvious rash, lesion, or ulcer.   Physical Exam LABORATORY PANEL:   CBC  Recent Labs Lab 04/12/16 0338  WBC 6.1  HGB 11.5*  HCT 33.1*  PLT 417   ------------------------------------------------------------------------------------------------------------------  Chemistries   Recent Labs Lab 04/12/16 0338  NA 140  K 3.6  CL 110  CO2 23  GLUCOSE 122*  BUN 14  CREATININE 1.03*  CALCIUM 9.2   ------------------------------------------------------------------------------------------------------------------  Cardiac Enzymes No results for input(s): TROPONINI in the last 168 hours. ------------------------------------------------------------------------------------------------------------------  RADIOLOGY:  Portable Chest 1 View  Result Date: 04/12/2016 CLINICAL DATA:  75 year old female with cough EXAM: PORTABLE CHEST 1 VIEW COMPARISON:  None. FINDINGS: Single portable view of the chest demonstrate an area of increased density at the left lung base concerning for atelectasis versus infiltrate. A small left pleural effusion may be present. The right lung is clear. No pneumothorax. Mild cardiomegaly. No acute osseous pathology. IMPRESSION: Focal left lung base atelectasis versus infiltration. Electronically Signed   By: Anner Crete M.D.   On: 04/12/2016 03:23    ASSESSMENT AND PLAN:   Active Problems:   Hypertensive urgency  * Nose bleed   Nasal packing and clip done by ENT.   Monitor. HTN is playing a role here, control better.  * Uncontroleld Htn   Metoprolol.   Added amlodipin and HCTZ. On Hydralazine IV.  * Cough   Likely from post nasal drip.   There is atelactesis Vs infiltrate in left lung base.   No other signs of  pneumonia- monitor now.     All the  records are reviewed and case discussed with Care Management/Social Workerr. Management plans discussed with the patient, family and they are in agreement.  CODE STATUS: full.  TOTAL TIME TAKING CARE OF THIS PATIENT: 35 minutes.     POSSIBLE D/C IN 1-2 DAYS, DEPENDING ON CLINICAL CONDITION.   Vaughan Basta M.D on 04/12/2016   Between 7am to 6pm - Pager - [phone]  After 6pm go to [email or web address] - password [email or web address]  Office  [phone]  CC: Primary care physician; No PCP Per Patient  Note: This dictation was prepared with Dragon dictation along with smaller phrase technology. Any transcriptional errors that result from this process are unintentional.

## 2016-04-12 NOTE — Care Management Obs Status (Signed)
MEDICARE OBSERVATION STATUS NOTIFICATION   Patient Details  Name: Paula Clark MRN: [id number] Date of Birth: 04/02/1941   Medicare Observation Status Notification Given:  Yes  Explained to patient, daughter, and son. Patient refused to sign due to illness. Copy left with daughter.   Marshell Garfinkel, RN 04/12/2016, 11:51 AM

## 2016-04-12 NOTE — Consult Note (Signed)
Paula Clark, [place] [id number] 1940-11-04 No att. providers found  Reason for Consult: Uncontrolled epistaxis  HPI: The patient is a 75 year old white female with a history of high blood pressure and recent recurrent epistaxis. 5 months ago her blood pressure medications were a little too strong and her blood pressures dropping too low, so was changed. Recently now she has had intermittent elevation of her blood pressure that has not been well controlled. 5 days ago she had a nosebleed and visited the emergency room. This stopped spontaneously. I saw her 2 days ago in the office and she had a small clot anteriorly and inferiorly on the septum with no sign of any other bleeding sites. A packing was placed as he had not bled in 3 days and seen to be quite stable. She awoke early Sunday morning with a little bit of bleeding and it stopped with pressure. It had bled on and off couple of times during the day and eventually she presented the emergency room in the evening when it didn't seem like it wanted to stop. She had a Rhino Rocket placed and was doing well with no bleeding until she got out of the wheelchair to get into the car at discharge. She started bleeding again anteriorly and was brought back to the ER and a Merocel sponge was placed instead. She did well with that until she got up to go to the bathroom and as she was walking back to the stretcher she started bleeding again. Lap was placed from the nose and it stopped. The bleeding is all open anterior. During this time her blood pressure has been elevated to [id number] systolic and 0000000 diastolic. He is not bleeding currently. Her blood pressure still elevated. She has Merocel sponge in and a small clip on the front of her nose.  Allergies: No Known Allergies  ROS: Review of systems normal other than 12 systems except per HPI.  PMH:  Past Medical History:  Diagnosis Date  . Hypertension     FH: History reviewed. No pertinent family history.  SH:   Social History   Social History  . Marital status: Widowed    Spouse name: N/A  . Number of children: N/A  . Years of education: N/A   Occupational History  . Not on file.   Social History Main Topics  . Smoking status: Former Research scientist (life sciences)  . Smokeless tobacco: Never Used  . Alcohol use No  . Drug use: No  . Sexual activity: Not on file   Other Topics Concern  . Not on file   Social History Narrative  . No narrative on file    PSH:  Past Surgical History:  Procedure Laterality Date  . APPENDECTOMY    . TONSILLECTOMY      Physical  Exam: The patient is awake and alert and very cooperative. She has Merocel sponge in the right side with a clip on her nose. There is dried blood on the sponge but no sign of any active bleeding.  there is no bleeding down the back of her throat. Her blood pressure still elevated at 176/88.   A/P: I discussed this with Dr. Dahlia Client who is the ER physician. The bleeding is all anterior and silver nitrate cautery or a sponge will not control bleeding when her blood pressure is not controlled. He is not bleeding right now I hate pulled the sponge out and replace another one as its fairly uncomfortable and continues to irritate the rest the mucosa in the nose  to cause even more bleeding sites. I think the most appropriate care would be to give her blood pressure medications to lower her blood pressure and keep her at bed rest overnight in the hospital in observation. If there is no further bleeding and then she can be discharged home with further blood pressure medications to keep it under better control. She'll hold her aspirin for 5 days again, and we'll plan to see her at 5 days for removing the sponge in the office. She has already been given a prescription of Augmentin that she will take while the sponges in place. If she has significant bleeding tonight again then we will have to remove the pack to try to control this, potentially in the operating room. I  suspect if we get the blood pressure under control we will have any further bleeding at this time.   Ilma Achee H 04/12/2016 12:27 AM

## 2016-04-13 ENCOUNTER — Observation Stay (HOSPITAL_BASED_OUTPATIENT_CLINIC_OR_DEPARTMENT_OTHER)
Admit: 2016-04-13 | Discharge: 2016-04-13 | Disposition: A | Discharge status: Home / Self Care | Payer: Commercial Managed Care - HMO | Attending: Physician Assistant | Admitting: Physician Assistant

## 2016-04-13 ENCOUNTER — Encounter: Payer: Self-pay | Admitting: Physician Assistant

## 2016-04-13 DIAGNOSIS — I4891 Unspecified atrial fibrillation: Secondary | ICD-10-CM

## 2016-04-13 DIAGNOSIS — R05 Cough: Secondary | ICD-10-CM | POA: Diagnosis not present

## 2016-04-13 DIAGNOSIS — E876 Hypokalemia: Secondary | ICD-10-CM

## 2016-04-13 DIAGNOSIS — R531 Weakness: Secondary | ICD-10-CM

## 2016-04-13 DIAGNOSIS — I160 Hypertensive urgency: Secondary | ICD-10-CM

## 2016-04-13 DIAGNOSIS — R040 Epistaxis: Secondary | ICD-10-CM | POA: Diagnosis present

## 2016-04-13 DIAGNOSIS — D649 Anemia, unspecified: Secondary | ICD-10-CM

## 2016-04-13 DIAGNOSIS — I4819 Other persistent atrial fibrillation: Secondary | ICD-10-CM

## 2016-04-13 LAB — BASIC METABOLIC PANEL
ANION GAP: 5 (ref 5–15)
BUN: 10 mg/dL (ref 6–20)
CALCIUM: 8.9 mg/dL (ref 8.9–10.3)
CO2: 26 mmol/L (ref 22–32)
Chloride: 105 mmol/L (ref 101–111)
Creatinine, Ser: 1.01 mg/dL — ABNORMAL HIGH (ref 0.44–1.00)
GFR calc Af Amer: >60 mL/min (ref >60)
GFR calc non Af Amer: 52 mL/min — ABNORMAL LOW (ref >60)
GLUCOSE: 156 mg/dL — AB (ref 65–99)
Potassium: 3.5 mmol/L (ref 3.5–5.1)
Sodium: 136 mmol/L (ref 135–145)

## 2016-04-13 LAB — MAGNESIUM: Magnesium: 1.5 mg/dL — ABNORMAL LOW (ref 1.7–2.4)

## 2016-04-13 LAB — TSH: TSH: 1.711 u[IU]/mL (ref 0.350–4.500)

## 2016-04-13 MED ORDER — AMLODIPINE BESYLATE 10 MG PO TABS
10.0000 mg | ORAL_TABLET | Freq: Every day | ORAL | Status: DC
  Administered 2016-04-13: 10 mg via ORAL
  Filled 2016-04-13: qty 1

## 2016-04-13 MED ORDER — DILTIAZEM HCL 30 MG PO TABS
30.0000 mg | ORAL_TABLET | Freq: Three times a day (TID) | ORAL | Status: DC
  Administered 2016-04-13 – 2016-04-14 (×3): 30 mg via ORAL
  Filled 2016-04-13 (×4): qty 1

## 2016-04-13 MED ORDER — HYDROCHLOROTHIAZIDE 25 MG PO TABS
25.0000 mg | ORAL_TABLET | Freq: Every day | ORAL | Status: DC
  Administered 2016-04-13 – 2016-04-15 (×3): 25 mg via ORAL
  Filled 2016-04-13 (×3): qty 1

## 2016-04-13 MED ORDER — MAGNESIUM OXIDE 400 (241.3 MG) MG PO TABS
400.0000 mg | ORAL_TABLET | Freq: Every day | ORAL | Status: DC
  Administered 2016-04-13 – 2016-04-14 (×2): 400 mg via ORAL
  Filled 2016-04-13 (×2): qty 1

## 2016-04-13 MED ORDER — POTASSIUM CHLORIDE CRYS ER 20 MEQ PO TBCR
20.0000 meq | EXTENDED_RELEASE_TABLET | Freq: Every day | ORAL | Status: DC
  Administered 2016-04-13 – 2016-04-14 (×2): 20 meq via ORAL
  Filled 2016-04-13 (×2): qty 1

## 2016-04-13 NOTE — Progress Notes (Signed)
[facility] NAME: Paula Clark    MR#:  [id number]  DATE OF BIRTH:  10-25-1940  SUBJECTIVE:  CHIEF COMPLAINT:   Chief Complaint  Patient presents with  . Epistaxis     Came with repeated Epistaxis and uncontrolled hypertension.   Have bleed from nose on coughing.    Today Blood pressure is under control and pt is feeling little better.  REVIEW OF SYSTEMS:  CONSTITUTIONAL: No fever, fatigue or weakness.  EYES: No blurred or double vision.  EARS, NOSE, AND THROAT: No tinnitus or ear pain.  RESPIRATORY: No cough, shortness of breath, wheezing or hemoptysis.  CARDIOVASCULAR: No chest pain, orthopnea, edema.  GASTROINTESTINAL: No nausea, vomiting, diarrhea or abdominal pain.  GENITOURINARY: No dysuria, hematuria.  ENDOCRINE: No polyuria, nocturia,  HEMATOLOGY: No anemia, easy bruising or bleeding SKIN: No rash or lesion. MUSCULOSKELETAL: No joint pain or arthritis.   NEUROLOGIC: No tingling, numbness, weakness.  PSYCHIATRY: No anxiety or depression.   ROS  DRUG ALLERGIES:  No Known Allergies  VITALS:  Blood pressure 136/73, pulse 74, temperature 98.6 F (37 C), temperature source Oral, resp. rate 18, height 5\' 3"  (1.6 m), weight 65.9 kg (145 lb 3.2 oz), SpO2 96 %.  PHYSICAL EXAMINATION:  GENERAL:  75 y.o.-year-old patient lying in the bed with no acute distress.  EYES: Pupils equal, round, reactive to light and accommodation. No scleral icterus. Extraocular muscles intact.  HEENT: Head atraumatic, normocephalic. Oropharynx clear, nasal packing- stained with blood present. NECK:  Supple, no jugular venous distention. No thyroid enlargement, no tenderness.  LUNGS: Normal breath sounds bilaterally, no wheezing, rales,rhonchi or crepitation. No use of accessory muscles of respiration.  CARDIOVASCULAR: S1, S2 normal. No murmurs, rubs, or gallops.  ABDOMEN: Soft, nontender, nondistended. Bowel sounds present. No organomegaly or  mass.  EXTREMITIES: No pedal edema, cyanosis, or clubbing.  NEUROLOGIC: Cranial nerves II through XII are intact. Muscle strength 5/5 in all extremities. Sensation intact. Gait not checked.  PSYCHIATRIC: The patient is alert and oriented x 3.  SKIN: No obvious rash, lesion, or ulcer.   Physical Exam LABORATORY PANEL:   CBC  Recent Labs Lab 04/12/16 0338  WBC 6.1  HGB 11.5*  HCT 33.1*  PLT 417   ------------------------------------------------------------------------------------------------------------------  Chemistries   Recent Labs Lab 04/13/16 1218  NA 136  K 3.5  CL 105  CO2 26  GLUCOSE 156*  BUN 10  CREATININE 1.01*  CALCIUM 8.9  MG 1.5*   ------------------------------------------------------------------------------------------------------------------  Cardiac Enzymes No results for input(s): TROPONINI in the last 168 hours. ------------------------------------------------------------------------------------------------------------------  RADIOLOGY:  Portable Chest 1 View  Result Date: 04/12/2016 CLINICAL DATA:  75 year old female with cough EXAM: PORTABLE CHEST 1 VIEW COMPARISON:  None. FINDINGS: Single portable view of the chest demonstrate an area of increased density at the left lung base concerning for atelectasis versus infiltrate. A small left pleural effusion may be present. The right lung is clear. No pneumothorax. Mild cardiomegaly. No acute osseous pathology. IMPRESSION: Focal left lung base atelectasis versus infiltration. Electronically Signed   By: Anner Crete M.D.   On: 04/12/2016 03:23    ASSESSMENT AND PLAN:   Principal Problem:   Epistaxis Active Problems:   Hypertensive urgency   New onset atrial fibrillation (HCC)  * Nose bleed   Nasal packing and clip done by ENT.   Monitor. HTN is playing a role here, control better.   ENT suggested to keep packing for 1 week.  * Uncontroleld Htn  Metoprolol.   Added amlodipin and HCTZ.  On Hydralazine IV.   Increased dose of meds for better control.  * Cough   Likely from post nasal drip.   There is atelactesis Vs infiltrate in left lung base.   No other signs of pneumonia- monitor now.  * A fib   New onset.   On metoprolol.   Get Echo.    Cardio consult.    Can not have anticoagulation now.  All the records are reviewed and case discussed with Care Management/Social Workerr. Management plans discussed with the patient, family and they are in agreement.  CODE STATUS: full.  TOTAL TIME TAKING CARE OF THIS PATIENT: 35 minutes.    POSSIBLE D/C IN 1-2 DAYS, DEPENDING ON CLINICAL CONDITION.   Paula Clark M.D on 04/13/2016   Between 7am to 6pm - Pager - [phone]  After 6pm go to [email or web address] - password [email or web address] [facility]  Office  [phone]  CC: Primary care physician; No PCP Per Patient  Note: This dictation was prepared with Dragon dictation along with smaller phrase technology. Any transcriptional errors that result from this process are unintentional.

## 2016-04-13 NOTE — Consult Note (Signed)
Cardiology Consultation Note  Patient ID: Paula Clark, MRN: [id number], DOB/AGE: 77-Dec-1942 77 y.o. Admit date: 04/11/2016   Date of Consult: 04/13/2016 Primary Physician: No PCP Per Patient Primary Cardiologist: New to [facility] Requesting Physician: Dr. Anselm Jungling, MD  Chief Complaint: Epistaxis  Reason for Consult: Newly documented Afib with RVR  HPI: 75 y.o. female with h/o "irregular heart beat" since her 49's of unknown rhythm, poorly controlled HTN, HLD, and gout who has been seen multiple times recently in the ED for epistaxis in the setting of malignant HTN was admitted for the same and found to have newly diagnosed Afib with RVR.   She has never seen a cardiologist before though was told in her 16's she had an "irregular heart beat." She does not know if she has ever been told if she had Afib at that time. Never on full-dose anticoagulation. No EKGs for review in Epic or Care Everywhere. BP has been poorly controlled for some time now with most recent reading at PCP in the [id number] systolic. She presented to the ED on 9/27 with epistaxis requiring packing in the ED that ultimately fell out prior to discharge. Unfortunately, she had a recurrence of her nose bleed x 4 on 10/1 that was subsequently packed again and she was discharged. When she was getting to her car she had a recurrence of her nose bleed. ENT saw patient in the ED, packed her and recommended admission for BP treatment and observation. BP during each ED visit was noted to be 194/92 on 9/27, 186/97 on 10/1, and 170/91 at time of admission. Apparently she had a 12-lead EKG done on 10/1 that reportedly showed Afib. Unfortunately, this EKG is not in Epic or the paper chart for review. Repeat EKG ordered at time of admission is not yet performed. HGB 11.9-->11.5, WBC 5.5, SCr 1.03, K+ 3.6. Magnesium and TSH have been ordered by cardiology. Recheck bmet ordered. Echo ordered.   She has never been able to feel palpitations. Nor has she had  increased SOB or any chest pain. Currently with nasal packing in place with mild epistaxis. Most recent BP showed a SBP of 136 mmHg.   Past Medical History:  Diagnosis Date  . Epistaxis   . Gout   . HLD (hyperlipidemia)   . Hypertension       Most Recent Cardiac Studies: none   Surgical History:  Past Surgical History:  Procedure Laterality Date  . APPENDECTOMY    . TONSILLECTOMY       Home Meds: Prior to Admission medications   Medication Sig Start Date End Date Taking? Authorizing Provider  aspirin EC 81 MG tablet Take 81 mg by mouth daily.   Yes Historical Provider, MD  Calcium Carbonate-Vitamin D 600-400 MG-UNIT tablet Take 1 tablet by mouth daily.   Yes Historical Provider, MD  doxazosin (CARDURA) 8 MG tablet Take 1 tablet by mouth daily. 03/01/16  Yes Historical Provider, MD  metoprolol succinate (TOPROL-XL) 50 MG 24 hr tablet Take 1 tablet by mouth daily. 02/19/16  Yes Historical Provider, MD  amoxicillin-clavulanate (AUGMENTIN) 875-125 MG tablet Take 1 tablet by mouth every 12 (twelve) hours. 04/11/16 04/21/16  Eula Listen, MD    Inpatient Medications:  . amLODipine  10 mg Oral Daily  . calcium-vitamin D  1 tablet Oral Daily  . doxazosin  8 mg Oral Daily  . hydrochlorothiazide  25 mg Oral Daily  . lidocaine 4% (54mL) with phenylEPHRINE 10% (0.85mL) topical solution   Topical Once  .  metoprolol succinate  100 mg Oral Daily  . silver nitrate applicators  1 Stick Topical Once  . sodium chloride flush  3 mL Intravenous Q12H   . sodium chloride 75 mL/hr at 04/12/16 1647    Allergies: No Known Allergies  Social History   Social History  . Marital status: Widowed    Spouse name: N/A  . Number of children: N/A  . Years of education: N/A   Occupational History  . Not on file.   Social History Main Topics  . Smoking status: Former Research scientist (life sciences)  . Smokeless tobacco: Never Used  . Alcohol use No  . Drug use: No  . Sexual activity: Not on file   Other Topics  Concern  . Not on file   Social History Narrative  . No narrative on file     Family History  Problem Relation Age of Onset  . Heart failure Mother      Review of Systems: Review of Systems  Constitutional: Positive for malaise/fatigue. Negative for chills, diaphoresis, fever and weight loss.  HENT: Positive for nosebleeds. Negative for congestion.   Eyes: Negative for discharge and redness.  Respiratory: Negative for cough, hemoptysis, sputum production, shortness of breath and wheezing.   Cardiovascular: Negative for chest pain, palpitations, orthopnea, claudication, leg swelling and PND.  Gastrointestinal: Negative for abdominal pain, blood in stool, heartburn, melena, nausea and vomiting.  Genitourinary: Negative for hematuria.  Musculoskeletal: Negative for falls and myalgias.  Skin: Negative for rash.  Neurological: Positive for weakness and headaches. Negative for dizziness, tingling, tremors, sensory change, speech change, focal weakness and loss of consciousness.  Endo/Heme/Allergies: Bruises/bleeds easily.  Psychiatric/Behavioral: Negative for substance abuse. The patient is not nervous/anxious.   All other systems reviewed and are negative.   Labs: No results for input(s): CKTOTAL, CKMB, TROPONINI in the last 72 hours. Lab Results  Component Value Date   WBC 6.1 04/12/2016   HGB 11.5 (L) 04/12/2016   HCT 33.1 (L) 04/12/2016   MCV 87.0 04/12/2016   PLT 417 04/12/2016     Recent Labs Lab 04/12/16 0338  NA 140  K 3.6  CL 110  CO2 23  BUN 14  CREATININE 1.03*  CALCIUM 9.2  GLUCOSE 122*   No results found for: CHOL, HDL, LDLCALC, TRIG No results found for: DDIMER  Radiology/Studies:  Portable Chest 1 View  Result Date: 04/12/2016 CLINICAL DATA:  75 year old female with cough EXAM: PORTABLE CHEST 1 VIEW COMPARISON:  None. FINDINGS: Single portable view of the chest demonstrate an area of increased density at the left lung base concerning for atelectasis  versus infiltrate. A small left pleural effusion may be present. The right lung is clear. No pneumothorax. Mild cardiomegaly. No acute osseous pathology. IMPRESSION: Focal left lung base atelectasis versus infiltration. Electronically Signed   By: Anner Crete M.D.   On: 04/12/2016 03:23    EKG: Not in Epic or paper chart for review. Repeat EKG ordered at time of admission still with status of held. Unable to review 12-lead.  Telemetry: Interpreted by me showed: Afib with heart rates in the 80's to 120's bpm  Weights: Filed Weights   04/11/16 1804 04/12/16 0306  Weight: 145 lb (65.8 kg) 145 lb 3.2 oz (65.9 kg)     Physical Exam: Blood pressure 136/63, pulse 76, temperature 97.8 F (36.6 C), temperature source Oral, resp. rate 18, height 5\' 3"  (1.6 m), weight 145 lb 3.2 oz (65.9 kg), SpO2 94 %. Body mass index is 25.72 kg/m. General:  Well developed, well nourished, in no acute distress. Head: Normocephalic, atraumatic, sclera non-icteric, no xanthomas, nares are with small amount of bloody discharge, s/p packing and clamp.  Neck: Negative for carotid bruits. JVD not elevated. Lungs: Clear bilaterally to auscultation without wheezes, rales, or rhonchi. Breathing is unlabored. Heart: Irregularly-irregular with S1 S2. II/VI systolic murmur RUSB, no rubs, or gallops appreciated. Abdomen: Soft, non-tender, non-distended with normoactive bowel sounds. No hepatomegaly. No rebound/guarding. No obvious abdominal masses. Msk:  Strength and tone appear normal for age. Extremities: No clubbing or cyanosis. No edema. Distal pedal pulses are 2+ and equal bilaterally. Neuro: Alert and oriented X 3. No facial asymmetry. No focal deficit. Moves all extremities spontaneously. Psych:  Responds to questions appropriately with a normal affect.    Assessment and Plan:  Principal Problem:   Epistaxis Active Problems:   Hypertensive urgency   New onset atrial fibrillation (HCC)    1. Newly diagnosed  Afib with RVR: -It is unclear at this time if this is truly a new diagnosis for her as she and her daughter report a long history of being told she has an "irregular heart beat" since her 37's -Unfortunately, there are no EKG's to review at all on this patient, even this admission  -I have ordered another EKG to be able to capture diagnostic evidence of her Afib -She is not a candidate for long term full-dose anticoagulation at this time given her ongoing epistaxis -Perhaps, once her epistaxis is resolved we can revisit full-dose anticoagulation  -For now will need to provide rate control only given she is not a candidate for pharmacological or electrical cardioversion given we cannot determine how long she has been in Afib -Change Norvasc to short-acting diltiazem 30 mg q 8 hours -Continue Toprol XL 100 mg daily -If needed consider low-dose digoxin for rate control -Check TSH and magnesium -Check echo -CHADS2VASc at least 4 (HTN, age x 2, sex category) at time of consult  2. Epistaxis: -Precludes anticoagulation as above -Per ENT  3. Accelerated HTN: -Currently improved with SBP in the 130's mmHg -Norvasc changed to diltiazem as above -Continue Toprol XL, HCTZ, and Cardura -Titrate as needed for optimal BP control  4. Hypokalemia: -Replete to 4.0 -Check bmet today   Signed, Marcille Blanco [place] Pager: [phone] 04/13/2016, 11:14 AM

## 2016-04-14 DIAGNOSIS — R05 Cough: Secondary | ICD-10-CM | POA: Diagnosis not present

## 2016-04-14 DIAGNOSIS — I160 Hypertensive urgency: Secondary | ICD-10-CM | POA: Diagnosis not present

## 2016-04-14 DIAGNOSIS — I4891 Unspecified atrial fibrillation: Secondary | ICD-10-CM | POA: Diagnosis not present

## 2016-04-14 DIAGNOSIS — R040 Epistaxis: Secondary | ICD-10-CM | POA: Diagnosis not present

## 2016-04-14 LAB — BASIC METABOLIC PANEL
ANION GAP: 8 (ref 5–15)
BUN: 11 mg/dL (ref 6–20)
CHLORIDE: 105 mmol/L (ref 101–111)
CO2: 25 mmol/L (ref 22–32)
Calcium: 9.2 mg/dL (ref 8.9–10.3)
Creatinine, Ser: 1.06 mg/dL — ABNORMAL HIGH (ref 0.44–1.00)
GFR calc Af Amer: 57 mL/min — ABNORMAL LOW (ref >60)
GFR, EST NON AFRICAN AMERICAN: 49 mL/min — AB (ref >60)
GLUCOSE: 114 mg/dL — AB (ref 65–99)
POTASSIUM: 3.4 mmol/L — AB (ref 3.5–5.1)
Sodium: 138 mmol/L (ref 135–145)

## 2016-04-14 LAB — ECHOCARDIOGRAM COMPLETE
Height: 63 in
Weight: 2323.2 oz

## 2016-04-14 LAB — CBC
HEMATOCRIT: 33.2 % — AB (ref 35.0–47.0)
HEMOGLOBIN: 11.5 g/dL — AB (ref 12.0–16.0)
MCH: 30.4 pg (ref 26.0–34.0)
MCHC: 34.5 g/dL (ref 32.0–36.0)
MCV: 88 fL (ref 80.0–100.0)
Platelets: 408 10*3/uL (ref 150–440)
RBC: 3.77 MIL/uL — ABNORMAL LOW (ref 3.80–5.20)
RDW: 14.5 % (ref 11.5–14.5)
WBC: 7.6 10*3/uL (ref 3.6–11.0)

## 2016-04-14 MED ORDER — MAGNESIUM OXIDE 400 (241.3 MG) MG PO TABS: 400.0000 mg | ORAL_TABLET | Freq: Two times a day (BID) | ORAL | Status: DC

## 2016-04-14 MED ORDER — DILTIAZEM HCL ER COATED BEADS 120 MG PO CP24
120.0000 mg | ORAL_CAPSULE | Freq: Every day | ORAL | Status: DC
  Administered 2016-04-14 – 2016-04-15 (×2): 120 mg via ORAL
  Filled 2016-04-14 (×3): qty 1

## 2016-04-14 MED ORDER — HYDRALAZINE HCL 25 MG PO TABS
25.0000 mg | ORAL_TABLET | Freq: Three times a day (TID) | ORAL | Status: DC
  Administered 2016-04-14 – 2016-04-15 (×3): 25 mg via ORAL
  Filled 2016-04-14 (×5): qty 1

## 2016-04-14 MED ORDER — POTASSIUM CHLORIDE CRYS ER 20 MEQ PO TBCR
20.0000 meq | EXTENDED_RELEASE_TABLET | Freq: Two times a day (BID) | ORAL | Status: DC
  Administered 2016-04-14 – 2016-04-15 (×2): 20 meq via ORAL
  Filled 2016-04-14 (×2): qty 1

## 2016-04-14 MED ORDER — MAGNESIUM SULFATE 2 GM/50ML IV SOLN
2.0000 g | Freq: Once | INTRAVENOUS | Status: AC
  Administered 2016-04-14: 2 g via INTRAVENOUS
  Filled 2016-04-14: qty 50

## 2016-04-14 NOTE — Progress Notes (Signed)
Patient: Paula Clark / Admit Date: 04/11/2016 / Date of Encounter: 04/14/2016, 10:36 AM   Subjective: No acute overnight events. Echo pending. Episode of epistaxis overnight. Heart rate controlled, remains in Afib. HGB stable. Potassium remains low. Cannot feel palpitations.   Review of Systems: Review of Systems  Constitutional: Positive for malaise/fatigue. Negative for chills, diaphoresis, fever and weight loss.  HENT: Positive for nosebleeds. Negative for congestion.   Eyes: Negative for discharge and redness.  Respiratory: Negative for cough, hemoptysis, sputum production, shortness of breath and wheezing.   Cardiovascular: Negative for chest pain, palpitations, orthopnea, claudication, leg swelling and PND.  Gastrointestinal: Negative for abdominal pain, blood in stool, heartburn, melena, nausea and vomiting.  Genitourinary: Negative for hematuria.  Musculoskeletal: Negative for falls and myalgias.  Skin: Negative for rash.  Neurological: Positive for weakness. Negative for dizziness, tingling, tremors, sensory change, speech change, focal weakness and loss of consciousness.  Endo/Heme/Allergies: Does not bruise/bleed easily.  Psychiatric/Behavioral: Negative for substance abuse. The patient is not nervous/anxious.   All other systems reviewed and are negative.   Objective: Telemetry: Afib, 60's to 80's bpm Physical Exam: Blood pressure (!) 151/71, pulse 75, temperature 98.8 F (37.1 C), temperature source Oral, resp. rate 18, height 5\' 3"  (1.6 m), weight 145 lb 3.2 oz (65.9 kg), SpO2 95 %. Body mass index is 25.72 kg/m. General: Well developed, well nourished, in no acute distress. Head: Normocephalic, nasal packing in place, sclera non-icteric, no xanthomas, nares are without discharge at this time. Neck: Negative for carotid bruits. JVP not elevated. Lungs: Clear bilaterally to auscultation without wheezes, rales, or rhonchi. Breathing is unlabored. Heart:  Irregularly-irregular S1 S2 without murmurs, rubs, or gallops.  Abdomen: Soft, non-tender, non-distended with normoactive bowel sounds. No rebound/guarding. Extremities: No clubbing or cyanosis. No edema. Distal pedal pulses are 2+ and equal bilaterally. Neuro: Alert and oriented X 3. Moves all extremities spontaneously. Psych:  Responds to questions appropriately with a normal affect.   Intake/Output Summary (Last 24 hours) at 04/14/16 1036 Last data filed at 04/14/16 1001  Gross per 24 hour  Intake           1522.5 ml  Output             2300 ml  Net           -777.5 ml    Inpatient Medications:  . calcium-vitamin D  1 tablet Oral Daily  . diltiazem  30 mg Oral Q8H  . doxazosin  8 mg Oral Daily  . hydrochlorothiazide  25 mg Oral Daily  . lidocaine 4% (76mL) with phenylEPHRINE 10% (0.47mL) topical solution   Topical Once  . magnesium oxide  400 mg Oral Daily  . magnesium sulfate 1 - 4 g bolus IVPB  2 g Intravenous Once  . potassium chloride  20 mEq Oral Daily  . silver nitrate applicators  1 Stick Topical Once  . sodium chloride flush  3 mL Intravenous Q12H   Infusions:    Labs:  Recent Labs  04/13/16 1218 04/14/16 0504  NA 136 138  K 3.5 3.4*  CL 105 105  CO2 26 25  GLUCOSE 156* 114*  BUN 10 11  CREATININE 1.01* 1.06*  CALCIUM 8.9 9.2  MG 1.5*  --    No results for input(s): AST, ALT, ALKPHOS, BILITOT, PROT, ALBUMIN in the last 72 hours.  Recent Labs  04/12/16 0338 04/14/16 0504  WBC 6.1 7.6  HGB 11.5* 11.5*  HCT 33.1* 33.2*  MCV  87.0 88.0  PLT 417 408   No results for input(s): CKTOTAL, CKMB, TROPONINI in the last 72 hours. Invalid input(s): POCBNP No results for input(s): HGBA1C in the last 72 hours.   Weights: Filed Weights   04/11/16 1804 04/12/16 0306  Weight: 145 lb (65.8 kg) 145 lb 3.2 oz (65.9 kg)     Radiology/Studies:  Portable Chest 1 View  Result Date: 04/12/2016 CLINICAL DATA:  75 year old female with cough EXAM: PORTABLE CHEST 1  VIEW COMPARISON:  None. FINDINGS: Single portable view of the chest demonstrate an area of increased density at the left lung base concerning for atelectasis versus infiltrate. A small left pleural effusion may be present. The right lung is clear. No pneumothorax. Mild cardiomegaly. No acute osseous pathology. IMPRESSION: Focal left lung base atelectasis versus infiltration. Electronically Signed   By: Anner Crete M.D.   On: 04/12/2016 03:23     Assessment and Plan  Principal Problem:   Epistaxis Active Problems:   Hypertensive urgency   Atrial fibrillation (HCC)   Generalized weakness   Hypokalemia   Anemia    1. Newly diagnosed Afib with RVR: -It is unclear at this time if this is truly a new diagnosis for her as she and her daughter report a long history of being told she has an "irregular heart beat" since her 52's -Unfortunately, there are no EKG's to review at all on this patient, even this admission  -I have ordered another EKG to be able to capture diagnostic evidence of her Afib, this remains pending -She is not a candidate for long term full-dose anticoagulation at this time given her ongoing epistaxis -Perhaps, once her epistaxis is resolved we can revisit full-dose anticoagulation  -If we see moving forward that she will not be a candidate for long term full-dose anticoagulation could consider Watchman device -For now will need to provide rate control only given she is not a candidate for pharmacological or electrical cardioversion given we cannot determine how long she has been in Afib -Change short-acting diltiazem to long-acting diltiazem 120 mg daily for rate control -Toprol XL held this morning 2/2 bradycardia -If her EF is found to be reduced would change out diltiazem for metoprolol  -If needed consider low-dose digoxin for rate control -TSH normal -Check echo, pending -CHADS2VASc at least 4 (HTN, age x 2, sex category) at time of consult  2.  Epistaxis: -Precludes anticoagulation as above -Per ENT  3. Accelerated HTN: -Currently improved with SBP in the 130's mmHg -Change short-acting diltiazem to long-acting diltiazem as above -Metoprolol changed 2/2 bradycardia -Need to add hydralazine given the stopping of metoprolol -Continue HCTZ, and Cardura -Titrate as needed for optimal BP control  4. Hypokalemia: -Replete to 4.0  5. Hypomagnesemia: -Replete to 2.0   Signed, Christell Faith, PA-C [facility] Pager: [phone] 04/14/2016, 10:36 AM

## 2016-04-14 NOTE — Evaluation (Signed)
Physical Therapy Evaluation Patient Details Name: Paula Clark MRN: [id number] DOB: 1940/07/24 Today's Date: 04/14/2016   History of Present Illness  Pt. presents to ED with recurrent epistaxis, potentially associated with uncontrolled BP past 61mo. Hx: HTN, Afib, anemia, hypokalemia.   Clinical Impression  Pt. Supine in bed sleeping upon arrival but agreeable to activity. Pt. Demonstrates grossly 5/5 strength and WFL AROM. BP and HR assessed throughout session and remained stable (see vitals) Able to perform bed mobility CGA, performed sit<>stand transfers with RW and SPC with supervision, Pt. Demonstrates approx. 25ft. Of gait CGA with use of RW demonstrating steady cadence and step through pattern, does not require B UE support for safe gait. Able to perform additional 282ft. Of gait with use of SPC CGA while performing multidirectional head turns without LOB or buckling. Pt. Performed ascent/descent of platform stepx2 with CGA and use of SPC demonstrating safe ability to negotiate threshold step of smaller height at home. Pt. Performed DGI scoring 22/4 which does not indicate that she is at risk for falls, her change in gait speed is only slight and she uses SPC with step to pattern when ascend/descending stairs. Pt. Demonstrates good safety awareness and safe mobility techniques. Pt. Reports and her son confirms that she is at her baseline level of mobility. Recommend use of SPC for all mobility at this time. No further need for skilled PT will continue to monitor mobility throughout her stay.     Follow Up Recommendations No PT follow up    Equipment Recommendations       Recommendations for Other Services       Precautions / Restrictions Precautions Precautions: Fall Restrictions Weight Bearing Restrictions: No      Mobility  Bed Mobility Overal bed mobility: Needs Assistance Bed Mobility: Supine to Sit     Supine to sit: Min guard     General bed mobility comments: Pt. able  to perform LE and Trunk movements with B UE assist from bedrails and HOB elevated.   Transfers Overall transfer level: Needs assistance Equipment used: Rolling walker (2 wheeled);Straight cane Transfers: Sit to/from Stand Sit to Stand: Supervision         General transfer comment: Pt. demonstrates safe transfer technique when performing sit<>stand both with use of RW and SPC.   Ambulation/Gait Ambulation/Gait assistance: Min guard Ambulation Distance (Feet): 40 Feet Assistive device: Rolling walker (2 wheeled)       General Gait Details: Pt. demonstrates step through pattern and steady cadence with use of RW, does not use for UE support.   Stairs Stairs: Yes Stairs assistance: Min guard Stair Management: No rails Number of Stairs: 2 General stair comments: Pt. able to demonstrate platform ascend/descendx2 with use of SPC CGA. Pt. reports she feels condifent with negotiation at home, report that home step is shorter than platform step practiced.   Wheelchair Mobility    Modified Rankin (Stroke Patients Only)       Balance Overall balance assessment: Needs assistance Sitting-balance support: Feet supported Sitting balance-Leahy Scale: Good     Standing balance support: Single extremity supported Standing balance-Leahy Scale: Good Standing balance comment: Able to perform dynamic standing activity with SPC                  Standardized Balance Assessment Standardized Balance Assessment : Dynamic Gait Index   Dynamic Gait Index Level Surface: Normal Change in Gait Speed: Mild Impairment (pt. demonstrates slight change in gait speed, good safety awareness) Gait with Horizontal Head  Turns: Normal Gait with Vertical Head Turns: Normal Gait and Pivot Turn: Normal Step Over Obstacle: Normal Step Around Obstacles: Normal Steps: Mild Impairment (Pt. uses SPC, ascends in step to pattern ) Total Score: 22       Pertinent Vitals/Pain Pain Assessment: No/denies  pain    Home Living Family/patient expects to be discharged to:: Private residence Living Arrangements: Alone Available Help at Discharge: Family (Pt's daughter is available 24/7 and son can be available if necesary ) Type of Home: Apartment Home Access: Stairs to enter Entrance Stairs-Rails: None Entrance Stairs-Number of Steps: 1 step  Home Layout: One level Home Equipment: Cane - single point Additional Comments: Pt. uses cane intermittently if she feels she needs it    Prior Function Level of Independence: Independent with assistive device(s)         Comments: Pt. reports independence with all ADL's prior, drives, grocery shopping, prepares meals      Hand Dominance        Extremity/Trunk Assessment   Upper Extremity Assessment: Overall WFL for tasks assessed (PT. demonstrates symmetrical grossly 5/5 strength UE, denies sensation deficits)           Lower Extremity Assessment: Overall WFL for tasks assessed (Pt. demonstrates BLE 5/5 strength grossly, denies sensation deficits )         Communication   Communication: No difficulties  Cognition Arousal/Alertness: Awake/alert Behavior During Therapy: WFL for tasks assessed/performed Overall Cognitive Status: Within Functional Limits for tasks assessed                      General Comments      Exercises Other Exercises Other Exercises: Pt. performed approx. 271ft. of ambulation CGA with use of SPC, pt. demonstrates ability to perform multidirectional head turns and change giat speed without LOB or buckling, pt. demonstrates baseline level of functioning.   Assessment/Plan    PT Assessment Patent does not need any further PT services  PT Problem List            PT Treatment Interventions DME instruction;Gait training;Stair training;Therapeutic exercise;Therapeutic activities;Functional mobility training;Patient/family education;Balance training    PT Goals (Current goals can be found in the  Care Plan section)  Acute Rehab PT Goals Patient Stated Goal: Pt. would like to return to her apt.  PT Goal Formulation: With patient Time For Goal Achievement: 04/28/16 Potential to Achieve Goals: Good    Frequency     Barriers to discharge Decreased caregiver support Pt. lives alone however, states daughter can provide as much assist as necessary     Co-evaluation               End of Session Equipment Utilized During Treatment: Gait belt Activity Tolerance: Patient tolerated treatment well Patient left: in chair;with call bell/phone within reach;with chair alarm set;with family/visitor present (Pt's son in room) Nurse Communication: Mobility status         Time: [id number] PT Time Calculation (min) (ACUTE ONLY): 29 min   Charges:         PT G Codes:         Melanie Crazier, SPT  04/14/16,4:33 PM

## 2016-04-14 NOTE — Progress Notes (Signed)
R nare packing intact. No active bleeding

## 2016-04-14 NOTE — Progress Notes (Signed)
Patient complaints of left nare pain and clamped cutting through skin. Patient states that she is able to feel air through left nare. Rnare tube intact with old drainage noted. Paged on call ENT. Dr. Tami Ribas states to remove clamp. Clamp removed nursing will continue to monitor.

## 2016-04-14 NOTE — Progress Notes (Signed)
CCMD notified nurse that patient is having afib with SVT on telemetry with HR in 50s. MD notified and D/C metoprolol.   Deri Fuelling, RN

## 2016-04-14 NOTE — Progress Notes (Signed)
Patient BP 120/47. MD notified. Ordered to hold BP med Hydralazine.   Deri Fuelling, RN

## 2016-04-15 ENCOUNTER — Telehealth: Payer: Self-pay

## 2016-04-15 DIAGNOSIS — R040 Epistaxis: Secondary | ICD-10-CM | POA: Diagnosis not present

## 2016-04-15 DIAGNOSIS — R05 Cough: Secondary | ICD-10-CM | POA: Diagnosis not present

## 2016-04-15 DIAGNOSIS — I4891 Unspecified atrial fibrillation: Secondary | ICD-10-CM | POA: Diagnosis not present

## 2016-04-15 DIAGNOSIS — I160 Hypertensive urgency: Secondary | ICD-10-CM | POA: Diagnosis not present

## 2016-04-15 LAB — MAGNESIUM: Magnesium: 1.6 mg/dL — ABNORMAL LOW (ref 1.7–2.4)

## 2016-04-15 MED ORDER — HYDROCHLOROTHIAZIDE 12.5 MG PO TABS: 12.5000 mg | ORAL_TABLET | Freq: Every day | ORAL | 1 refills | Status: DC

## 2016-04-15 MED ORDER — HYDRALAZINE HCL 25 MG PO TABS: 25.0000 mg | ORAL_TABLET | Freq: Three times a day (TID) | ORAL | 1 refills | Status: DC

## 2016-04-15 MED ORDER — DILTIAZEM HCL ER COATED BEADS 120 MG PO CP24: 120.0000 mg | ORAL_CAPSULE | Freq: Every day | ORAL | 1 refills | Status: DC

## 2016-04-15 MED ORDER — HYDROCODONE-ACETAMINOPHEN 5-325 MG PO TABS: 1.0000 | ORAL_TABLET | Freq: Four times a day (QID) | ORAL | 0 refills | Status: DC | PRN

## 2016-04-15 MED ORDER — SENNOSIDES-DOCUSATE SODIUM 8.6-50 MG PO TABS
1.0000 | ORAL_TABLET | Freq: Every evening | ORAL | 0 refills | Status: DC | PRN
Start: 2016-04-15 — End: 2016-05-06

## 2016-04-15 MED ORDER — ASPIRIN EC 81 MG PO TBEC: 81.0000 mg | DELAYED_RELEASE_TABLET | Freq: Every day | ORAL | 0 refills | Status: DC

## 2016-04-15 MED ORDER — POTASSIUM CHLORIDE CRYS ER 20 MEQ PO TBCR: 20.0000 meq | EXTENDED_RELEASE_TABLET | Freq: Two times a day (BID) | ORAL | 0 refills | Status: DC

## 2016-04-15 NOTE — Discharge Summary (Signed)
[place] at [place] NAME: Paula Clark    MR#:  [id number]  DATE OF BIRTH:  09-04-1940  DATE OF ADMISSION:  04/11/2016 ADMITTING PHYSICIAN: Harvie Bridge, DO  DATE OF DISCHARGE: 04/15/2016  PRIMARY CARE PHYSICIAN: BABAOFF, MARC E, MD    ADMISSION DIAGNOSIS:  Epistaxis [R04.0] Generalized weakness [R53.1] Right-sided epistaxis [R04.0] Atrial fibrillation, unspecified type ([place]) [I48.91]  DISCHARGE DIAGNOSIS:  Principal Problem:   Epistaxis Active Problems:   Hypertensive urgency   Atrial fibrillation (HCC)   Generalized weakness   Hypokalemia   Anemia   SECONDARY DIAGNOSIS:   Past Medical History:  Diagnosis Date  . Epistaxis   . Gout   . HLD (hyperlipidemia)   . Hypertension     HOSPITAL COURSE:   * Nose bleed   Nasal packing and clip done by ENT.   Monitor. HTN is playing a role here, control better.   ENT suggested to keep packing for 1 week.   Nasal clip is now removed, no bleed.   She need to see ENT office tomorrow for pack removal.  * Uncontroleld Htn   Metoprolol.   Added amlodipin and HCTZ. On Hydralazine IV.   Increased dose of meds for better control.   stopped metoprolol due to bradycardia.  * Cough   Likely from post nasal drip.   There is atelactesis Vs infiltrate in left lung base.   No other signs of pneumonia- monitor now.  * A fib   New onset.   On metoprolol- stopped due to brady.   On cardizem oral.   Get Echo.   Cardio consult.   Can not have anticoagulation now.   HR is controlled , Echo is without any gross abnormalities.    Cardio cleared for discharge- suggested  To follow in office next week to start on anticoagulation.  DISCHARGE CONDITIONS:   Stable.  CONSULTS OBTAINED:  Treatment Team:  Margaretha Sheffield, MD Minna Merritts, MD  DRUG ALLERGIES:  No Known Allergies  DISCHARGE MEDICATIONS:   Current Discharge Medication List    START taking these  medications   Details  amoxicillin-clavulanate (AUGMENTIN) 875-125 MG tablet Take 1 tablet by mouth every 12 (twelve) hours. Qty: 20 tablet, Refills: 0    diltiazem (CARDIZEM CD) 120 MG 24 hr capsule Take 1 capsule (120 mg total) by mouth daily. Qty: 30 capsule, Refills: 1    hydrALAZINE (APRESOLINE) 25 MG tablet Take 1 tablet (25 mg total) by mouth every 8 (eight) hours. Qty: 90 tablet, Refills: 1    hydrochlorothiazide (HYDRODIURIL) 12.5 MG tablet Take 1 tablet (12.5 mg total) by mouth daily. Qty: 30 tablet, Refills: 1    HYDROcodone-acetaminophen (NORCO/VICODIN) 5-325 MG tablet Take 1 tablet by mouth every 6 (six) hours as needed for moderate pain. Qty: 15 tablet, Refills: 0    potassium chloride SA (K-DUR,KLOR-CON) 20 MEQ tablet Take 1 tablet (20 mEq total) by mouth 2 (two) times daily. Qty: 30 tablet, Refills: 0    senna-docusate (SENOKOT-S) 8.6-50 MG tablet Take 1 tablet by mouth at bedtime as needed for mild constipation. Qty: 20 tablet, Refills: 0      CONTINUE these medications which have CHANGED   Details  aspirin EC 81 MG tablet Take 1 tablet (81 mg total) by mouth daily. Qty: 30 tablet, Refills: 0      CONTINUE these medications which have NOT CHANGED   Details  Calcium Carbonate-Vitamin D 600-400 MG-UNIT tablet Take 1 tablet by mouth daily.  doxazosin (CARDURA) 8 MG tablet Take 1 tablet by mouth daily.      STOP taking these medications     metoprolol succinate (TOPROL-XL) 50 MG 24 hr tablet          DISCHARGE INSTRUCTIONS:    Follow with ENT tomorrow, Cardiology next week.  If you experience worsening of your admission symptoms, develop shortness of breath, life threatening emergency, suicidal or homicidal thoughts you must seek medical attention immediately by calling 911 or calling your MD immediately  if symptoms less severe.  You Must read complete instructions/literature along with all the possible adverse reactions/side effects for all the  Medicines you take and that have been prescribed to you. Take any new Medicines after you have completely understood and accept all the possible adverse reactions/side effects.   Please note  You were cared for by a hospitalist during your hospital stay. If you have any questions about your discharge medications or the care you received while you were in the hospital after you are discharged, you can call the unit and asked to speak with the hospitalist on call if the hospitalist that took care of you is not available. Once you are discharged, your primary care physician will handle any further medical issues. Please note that NO REFILLS for any discharge medications will be authorized once you are discharged, as it is imperative that you return to your primary care physician (or establish a relationship with a primary care physician if you do not have one) for your aftercare needs so that they can reassess your need for medications and monitor your lab values.    Today   CHIEF COMPLAINT:   Chief Complaint  Patient presents with  . Epistaxis    HISTORY OF PRESENT ILLNESS:  Paula Clark  is a 75 y.o. female with a known history of hypertension presents to the emergency department complaining of nosebleed.  Patient was in a usual state of health until Last week when she developed epistaxis. She was seen in the emergency department, packed and discharged home with ENT follow-up. She reports a nosebleed this morning that stopped with manual pressure but has since had 3 additional nosebleeds which did not stop prompting an emergency department visit today. She was seen again, discharged again but when she got in her car, bleeding resumed.  She was seen in the emergency department by ENT who packed her nose and recommended admission for observation, BP control and consideration of cauterization if bleeding persists once she is normotensive.  My questioning she reports a mild cough associated with  dripping down her throat, generalized weakness. Of note she reports that her blood pressure medications have been lowered about 5 months ago because her blood pressure was dropping too low.  Otherwise there has been no change in status. Patient has been taking medication as prescribed and there has been no recent change in medication or diet.  There has been no recent illness, travel or sick contacts.    Patient denies fevers/chills, dizziness, chest pain, shortness of breath, N/V/C/D, abdominal pain, dysuria/frequency, changes in mental status.    VITAL SIGNS:  Blood pressure 128/68, pulse 78, temperature 98.2 F (36.8 C), temperature source Oral, resp. rate 16, height 5\' 3"  (1.6 m), weight 65.9 kg (145 lb 3.2 oz), SpO2 96 %.  I/O:   Intake/Output Summary (Last 24 hours) at 04/15/16 1610 Last data filed at 04/15/16 0900  Gross per 24 hour  Intake  240 ml  Output                0 ml  Net              240 ml    PHYSICAL EXAMINATION:   GENERAL:  75 y.o.-year-old patient lying in the bed with no acute distress.  EYES: Pupils equal, round, reactive to light and accommodation. No scleral icterus. Extraocular muscles intact.  HEENT: Head atraumatic, normocephalic. Oropharynx clear, nasal packing- stained with blood present. NECK:  Supple, no jugular venous distention. No thyroid enlargement, no tenderness.  LUNGS: Normal breath sounds bilaterally, no wheezing, rales,rhonchi or crepitation. No use of accessory muscles of respiration.  CARDIOVASCULAR: S1, S2 normal. No murmurs, rubs, or gallops.  ABDOMEN: Soft, nontender, nondistended. Bowel sounds present. No organomegaly or mass.  EXTREMITIES: No pedal edema, cyanosis, or clubbing.  NEUROLOGIC: Cranial nerves II through XII are intact. Muscle strength 5/5 in all extremities. Sensation intact. Gait not checked.  PSYCHIATRIC: The patient is alert and oriented x 3.  SKIN: No obvious rash, lesion, or ulcer.   DATA REVIEW:    CBC  Recent Labs Lab 04/14/16 0504  WBC 7.6  HGB 11.5*  HCT 33.2*  PLT 408    Chemistries   Recent Labs Lab 04/13/16 1218 04/14/16 0504  NA 136 138  K 3.5 3.4*  CL 105 105  CO2 26 25  GLUCOSE 156* 114*  BUN 10 11  CREATININE 1.01* 1.06*  CALCIUM 8.9 9.2  MG 1.5*  --     Cardiac Enzymes No results for input(s): TROPONINI in the last 168 hours.  Microbiology Results  No results found for this or any previous visit.  RADIOLOGY:  No results found.  EKG:   Orders placed or performed during the hospital encounter of 04/11/16  . ED EKG  . ED EKG  . EKG 12-Lead  . EKG 12-Lead      Management plans discussed with the patient, family and they are in agreement.  CODE STATUS:     Code Status Orders        Start     Ordered   04/12/16 0252  Full code  Continuous     04/12/16 0251    Code Status History    Date Active Date Inactive Code Status Order ID Comments User Context   This patient has a current code status but no historical code status.      TOTAL TIME TAKING CARE OF THIS PATIENT: 35 minutes.    Vaughan Basta M.D on 04/15/2016 at 4:10 PM  Between 7am to 6pm - Pager - [phone]  After 6pm go to [email or web address] - password [email or web address] [facility]  Office  [phone]  CC: Primary care physician; BABAOFF, Caryl Bis, MD   Note: This dictation was prepared with Dragon dictation along with smaller phrase technology. Any transcriptional errors that result from this process are unintentional.

## 2016-04-15 NOTE — Telephone Encounter (Signed)
Patient contacted regarding discharge from [facility] on 04/15/16. Spoke w/ pt's daughter.   Patient understands to follow up with Christell Faith, PA on 05/03/16 at 1:30 at [facility]. Patient understands discharge instructions? yes Patient understands medications and regiment? yes Patient understands to bring all medications to this visit? yes

## 2016-04-15 NOTE — Progress Notes (Signed)
[place] at [place] NAME: Paula Clark    MR#:  [id number]  DATE OF BIRTH:  1941-05-19  SUBJECTIVE:  CHIEF COMPLAINT:   Chief Complaint  Patient presents with  . Epistaxis     Came with repeated Epistaxis and uncontrolled hypertension.   Have bleed from nose on coughing.    Today Blood pressure is under control and pt is feeling little better.  had clip too tight on nose, so removed after talking to ENT in night. No more bleeding.  HR was slow on tele, so metoprolol is held.  REVIEW OF SYSTEMS:  CONSTITUTIONAL: No fever, fatigue or weakness.  EYES: No blurred or double vision.  EARS, NOSE, AND THROAT: No tinnitus or ear pain.  RESPIRATORY: No cough, shortness of breath, wheezing or hemoptysis.  CARDIOVASCULAR: No chest pain, orthopnea, edema.  GASTROINTESTINAL: No nausea, vomiting, diarrhea or abdominal pain.  GENITOURINARY: No dysuria, hematuria.  ENDOCRINE: No polyuria, nocturia,  HEMATOLOGY: No anemia, easy bruising or bleeding SKIN: No rash or lesion. MUSCULOSKELETAL: No joint pain or arthritis.   NEUROLOGIC: No tingling, numbness, weakness.  PSYCHIATRY: No anxiety or depression.   ROS  DRUG ALLERGIES:  No Known Allergies  VITALS:  Blood pressure (!) 127/51, pulse 61, temperature 97.7 F (36.5 C), temperature source Oral, resp. rate 16, height 5\' 3"  (1.6 m), weight 65.9 kg (145 lb 3.2 oz), SpO2 98 %.  PHYSICAL EXAMINATION:  GENERAL:  75 y.o.-year-old patient lying in the bed with no acute distress.  EYES: Pupils equal, round, reactive to light and accommodation. No scleral icterus. Extraocular muscles intact.  HEENT: Head atraumatic, normocephalic. Oropharynx clear, nasal packing- stained with blood present. NECK:  Supple, no jugular venous distention. No thyroid enlargement, no tenderness.  LUNGS: Normal breath sounds bilaterally, no wheezing, rales,rhonchi or crepitation. No use of accessory muscles of respiration.   CARDIOVASCULAR: S1, S2 normal. No murmurs, rubs, or gallops.  ABDOMEN: Soft, nontender, nondistended. Bowel sounds present. No organomegaly or mass.  EXTREMITIES: No pedal edema, cyanosis, or clubbing.  NEUROLOGIC: Cranial nerves II through XII are intact. Muscle strength 5/5 in all extremities. Sensation intact. Gait not checked.  PSYCHIATRIC: The patient is alert and oriented x 3.  SKIN: No obvious rash, lesion, or ulcer.   Physical Exam LABORATORY PANEL:   CBC  Recent Labs Lab 04/14/16 0504  WBC 7.6  HGB 11.5*  HCT 33.2*  PLT 408   ------------------------------------------------------------------------------------------------------------------  Chemistries   Recent Labs Lab 04/13/16 1218 04/14/16 0504  NA 136 138  K 3.5 3.4*  CL 105 105  CO2 26 25  GLUCOSE 156* 114*  BUN 10 11  CREATININE 1.01* 1.06*  CALCIUM 8.9 9.2  MG 1.5*  --    ------------------------------------------------------------------------------------------------------------------  Cardiac Enzymes No results for input(s): TROPONINI in the last 168 hours. ------------------------------------------------------------------------------------------------------------------  RADIOLOGY:  No results found.  ASSESSMENT AND PLAN:   Principal Problem:   Epistaxis Active Problems:   Hypertensive urgency   Atrial fibrillation (HCC)   Generalized weakness   Hypokalemia   Anemia  * Nose bleed   Nasal packing and clip done by ENT.   Monitor. HTN is playing a role here, control better.   ENT suggested to keep packing for 1 week.   Nasal clip is now removed, no bleed.  * Uncontroleld Htn   Metoprolol.   Added amlodipin and HCTZ. On Hydralazine IV.   Increased dose of meds for better control.   stopped metoprolol due to bradycardia.  *  Cough   Likely from post nasal drip.   There is atelactesis Vs infiltrate in left lung base.   No other signs of pneumonia- monitor now.  * A fib   New  onset.   On metoprolol- stopped due to brady.   On cardizem oral.   Get Echo.    Cardio consult.    Can not have anticoagulation now.  All the records are reviewed and case discussed with Care Management/Social Workerr. Management plans discussed with the patient, family and they are in agreement.  CODE STATUS: full.  TOTAL TIME TAKING CARE OF THIS PATIENT: 35 minutes.    POSSIBLE D/C IN 1-2 DAYS, DEPENDING ON CLINICAL CONDITION.   Vaughan Basta M.D on 04/15/2016   Between 7am to 6pm - Pager - [phone]  After 6pm go to [email or web address] - password [email or web address] [facility]  Office  [phone]  CC: Primary care physician; No PCP Per Patient  Note: This dictation was prepared with Dragon dictation along with smaller phrase technology. Any transcriptional errors that result from this process are unintentional.

## 2016-04-15 NOTE — Discharge Instructions (Signed)
Please hold your low-dose aspirin one week , then restart it. Please make an appointment with both the ENT doctor and a cardiologist for follow-up.  Return to the emergency department if you develop lightheadedness or fainting, chest pain or shortness of breath, fever, or any other symptoms concerning to you.  Need to check potassium and Blood pressure with PMD or cardiologist in 1-2 weeks.

## 2016-04-15 NOTE — Telephone Encounter (Signed)
-----   Message from Arie Sabina sent at 04/15/2016  1:09 PM EDT ----- Regarding: tcm/ph Dunn  05/03/16 @ 1:30

## 2016-04-15 NOTE — Progress Notes (Signed)
   Heart rate well controlled on current regimen. Continue current medications. No new recommendation from progress note dated 10/4.

## 2016-04-15 NOTE — Discharge Planning (Signed)
Patient IV and tele removed.  Discharge papers given, explained and educated.  Informed of suggested FU appts and also given scripts.  RN assessment and VS revealed stability for DC to home.  Will be wheeled to front when ready and family transporting home via car.

## 2016-04-16 ENCOUNTER — Observation Stay
Admission: EM | Admit: 2016-04-16 | Discharge: 2016-04-18 | Disposition: A | Discharge status: Home / Self Care | Payer: Commercial Managed Care - HMO | Attending: Specialist | Admitting: Specialist

## 2016-04-16 ENCOUNTER — Encounter: Payer: Self-pay | Admitting: Internal Medicine

## 2016-04-16 DIAGNOSIS — Z87891 Personal history of nicotine dependence: Secondary | ICD-10-CM | POA: Insufficient documentation

## 2016-04-16 DIAGNOSIS — R531 Weakness: Secondary | ICD-10-CM | POA: Diagnosis not present

## 2016-04-16 DIAGNOSIS — R748 Abnormal levels of other serum enzymes: Secondary | ICD-10-CM | POA: Diagnosis not present

## 2016-04-16 DIAGNOSIS — E785 Hyperlipidemia, unspecified: Secondary | ICD-10-CM | POA: Insufficient documentation

## 2016-04-16 DIAGNOSIS — R7989 Other specified abnormal findings of blood chemistry: Secondary | ICD-10-CM

## 2016-04-16 DIAGNOSIS — R778 Other specified abnormalities of plasma proteins: Secondary | ICD-10-CM | POA: Diagnosis present

## 2016-04-16 DIAGNOSIS — Z7982 Long term (current) use of aspirin: Secondary | ICD-10-CM | POA: Insufficient documentation

## 2016-04-16 DIAGNOSIS — I481 Persistent atrial fibrillation: Secondary | ICD-10-CM | POA: Diagnosis not present

## 2016-04-16 DIAGNOSIS — N179 Acute kidney failure, unspecified: Secondary | ICD-10-CM | POA: Diagnosis not present

## 2016-04-16 DIAGNOSIS — I10 Essential (primary) hypertension: Secondary | ICD-10-CM | POA: Insufficient documentation

## 2016-04-16 DIAGNOSIS — R040 Epistaxis: Secondary | ICD-10-CM | POA: Insufficient documentation

## 2016-04-16 DIAGNOSIS — E860 Dehydration: Secondary | ICD-10-CM | POA: Insufficient documentation

## 2016-04-16 DIAGNOSIS — I4819 Other persistent atrial fibrillation: Secondary | ICD-10-CM | POA: Diagnosis present

## 2016-04-16 DIAGNOSIS — Z885 Allergy status to narcotic agent status: Secondary | ICD-10-CM | POA: Diagnosis not present

## 2016-04-16 DIAGNOSIS — R262 Difficulty in walking, not elsewhere classified: Secondary | ICD-10-CM

## 2016-04-16 DIAGNOSIS — R55 Syncope and collapse: Principal | ICD-10-CM | POA: Insufficient documentation

## 2016-04-16 DIAGNOSIS — I4891 Unspecified atrial fibrillation: Secondary | ICD-10-CM | POA: Diagnosis not present

## 2016-04-16 LAB — URINALYSIS COMPLETE WITH MICROSCOPIC (ARMC ONLY)
BILIRUBIN URINE: NEGATIVE
Glucose, UA: NEGATIVE mg/dL
Hgb urine dipstick: NEGATIVE
Ketones, ur: NEGATIVE mg/dL
Nitrite: NEGATIVE
PH: 5 (ref 5.0–8.0)
PROTEIN: NEGATIVE mg/dL
Specific Gravity, Urine: 1.012 (ref 1.005–1.030)

## 2016-04-16 LAB — TROPONIN I
Troponin I: 0.03 ng/mL (ref <0.03)
Troponin I: <0.03 ng/mL (ref <0.03)

## 2016-04-16 LAB — BASIC METABOLIC PANEL
ANION GAP: 9 (ref 5–15)
BUN: 20 mg/dL (ref 6–20)
CALCIUM: 9.6 mg/dL (ref 8.9–10.3)
CO2: 24 mmol/L (ref 22–32)
CREATININE: 1.63 mg/dL — AB (ref 0.44–1.00)
Chloride: 102 mmol/L (ref 101–111)
GFR calc Af Amer: 34 mL/min — ABNORMAL LOW (ref >60)
GFR calc non Af Amer: 29 mL/min — ABNORMAL LOW (ref >60)
GLUCOSE: 139 mg/dL — AB (ref 65–99)
Potassium: 3.6 mmol/L (ref 3.5–5.1)
Sodium: 135 mmol/L (ref 135–145)

## 2016-04-16 LAB — CBC
HCT: 34.4 % — ABNORMAL LOW (ref 35.0–47.0)
HEMOGLOBIN: 11.7 g/dL — AB (ref 12.0–16.0)
MCH: 30.2 pg (ref 26.0–34.0)
MCHC: 33.8 g/dL (ref 32.0–36.0)
MCV: 89.2 fL (ref 80.0–100.0)
Platelets: 463 10*3/uL — ABNORMAL HIGH (ref 150–440)
RBC: 3.86 MIL/uL (ref 3.80–5.20)
RDW: 14.6 % — AB (ref 11.5–14.5)
WBC: 9 10*3/uL (ref 3.6–11.0)

## 2016-04-16 MED ORDER — SODIUM CHLORIDE 0.9 % IV SOLN
INTRAVENOUS | Status: DC
  Administered 2016-04-16: 23:00:00 via INTRAVENOUS

## 2016-04-16 MED ORDER — DILTIAZEM HCL ER COATED BEADS 120 MG PO CP24
120.0000 mg | ORAL_CAPSULE | Freq: Every day | ORAL | Status: DC
  Administered 2016-04-17 – 2016-04-18 (×2): 120 mg via ORAL
  Filled 2016-04-16 (×2): qty 1

## 2016-04-16 MED ORDER — DOXAZOSIN MESYLATE 4 MG PO TABS
8.0000 mg | ORAL_TABLET | Freq: Every day | ORAL | Status: DC
  Administered 2016-04-17 – 2016-04-18 (×2): 8 mg via ORAL
  Filled 2016-04-16 (×2): qty 2

## 2016-04-16 MED ORDER — ONDANSETRON HCL 4 MG PO TABS: 4.0000 mg | ORAL_TABLET | Freq: Four times a day (QID) | ORAL | Status: DC | PRN

## 2016-04-16 MED ORDER — SODIUM CHLORIDE 0.9% FLUSH
3.0000 mL | Freq: Two times a day (BID) | INTRAVENOUS | Status: DC
  Administered 2016-04-17 – 2016-04-18 (×2): 3 mL via INTRAVENOUS

## 2016-04-16 MED ORDER — HYDRALAZINE HCL 25 MG PO TABS
25.0000 mg | ORAL_TABLET | Freq: Three times a day (TID) | ORAL | Status: DC
  Administered 2016-04-17 – 2016-04-18 (×4): 25 mg via ORAL
  Filled 2016-04-16 (×4): qty 1

## 2016-04-16 MED ORDER — ACETAMINOPHEN 650 MG RE SUPP: 650.0000 mg | Freq: Four times a day (QID) | RECTAL | Status: DC | PRN

## 2016-04-16 MED ORDER — ONDANSETRON HCL 4 MG/2ML IJ SOLN: 4.0000 mg | Freq: Four times a day (QID) | INTRAMUSCULAR | Status: DC | PRN

## 2016-04-16 MED ORDER — HYDROCODONE-ACETAMINOPHEN 5-325 MG PO TABS: 1.0000 | ORAL_TABLET | Freq: Four times a day (QID) | ORAL | Status: DC | PRN

## 2016-04-16 MED ORDER — ACETAMINOPHEN 325 MG PO TABS: 650.0000 mg | ORAL_TABLET | Freq: Four times a day (QID) | ORAL | Status: DC | PRN

## 2016-04-16 MED ORDER — SODIUM CHLORIDE 0.9 % IV BOLUS (SEPSIS)
1000.0000 mL | Freq: Once | INTRAVENOUS | Status: AC
  Administered 2016-04-16: 1000 mL via INTRAVENOUS

## 2016-04-16 MED ORDER — HYDRALAZINE HCL 50 MG PO TABS
25.0000 mg | ORAL_TABLET | Freq: Once | ORAL | Status: AC
  Administered 2016-04-16: 25 mg via ORAL
  Filled 2016-04-16: qty 1

## 2016-04-16 NOTE — ED Notes (Signed)
Pt's nose cleaned of dried blood and drainage using half peroxide mix per Dr. Theodosia Paling request

## 2016-04-16 NOTE — Discharge Instructions (Signed)
Please drink plenty of fluids and hold the hydrochlorothiazide. Please call the cardiologist on Monday to see if you can establish an earlier appointment. Please continue all your currently prescribed medication other than the hydrochlorothiazide. Return to the emergency department for chest pain, seizure, syncope the last more than 15 minutes.  Please return immediately if condition worsens. Please contact her primary physician or the physician you were given for referral. If you have any specialist physicians involved in her treatment and plan please also contact them. Thank you for using [facility].

## 2016-04-16 NOTE — ED Notes (Signed)
Pt informed plan is to discharge home, pt request to stay for observation.  Dr. Marcelene Butte informed and now at bedside to discuss.  Per Dr. Marcelene Butte, hospitalist to be consulted

## 2016-04-16 NOTE — ED Notes (Signed)
Pt transported to room 233. 

## 2016-04-16 NOTE — ED Triage Notes (Signed)
Pt arrives today via [facility] from home where she had a syncopal episode this am resulting in a fall  Daughter reports that she fell but did not hit the floor but while she was helping her from falling the pt had her eyes open but was not responding  Pt alert in triage   She denies pain

## 2016-04-16 NOTE — H&P (Addendum)
[facility] NAME: Paula Clark    MR#:  [id number]  DATE OF BIRTH:  [birth year]  DATE OF ADMISSION:  04/16/2016  PRIMARY CARE PHYSICIAN: BABAOFF, Caryl Bis, MD   REQUESTING/REFERRING PHYSICIAN: Marcelene Butte, MD  CHIEF COMPLAINT:   Chief Complaint  Patient presents with  . Fall  . Loss of Consciousness    HISTORY OF PRESENT ILLNESS:  Paula Clark  is a 75 y.o. female who presents with Syncope. Patient was recently discharged from hospital stay for epistaxis and evaluation of A. fib. She went home and continued to feel weak and this morning had a syncopal episode. There is no seizure activity. The episode was witnessed, she had no postictal state. Here in the ED she was found to have a borderline elevated troponin, and she is in persistent A. fib. She is rate controlled when she is laying down, though her heart rate tends to rise significantly when she gets up and moves around per her daughter's report. Given her syncope and persistent weakness, hospitalists were called for further evaluation.  PAST MEDICAL HISTORY:   Past Medical History:  Diagnosis Date  . Atrial fibrillation ([place])   . Epistaxis   . Gout   . HLD (hyperlipidemia)   . Hypertension     PAST SURGICAL HISTORY:   Past Surgical History:  Procedure Laterality Date  . APPENDECTOMY    . TONSILLECTOMY      SOCIAL HISTORY:   Social History  Substance Use Topics  . Smoking status: Former Research scientist (life sciences)  . Smokeless tobacco: Never Used  . Alcohol use No    FAMILY HISTORY:   Family History  Problem Relation Age of Onset  . Heart failure Mother     DRUG ALLERGIES:   Allergies  Allergen Reactions  . Codeine Nausea And Vomiting    MEDICATIONS AT HOME:   Prior to Admission medications   Medication Sig Start Date End Date Taking? Authorizing Provider  aspirin EC 81 MG tablet Take 1 tablet (81 mg total) by mouth daily. 04/20/16  Yes Vaughan Basta, MD   Calcium Carbonate-Vitamin D 600-400 MG-UNIT tablet Take 1 tablet by mouth daily.   Yes Historical Provider, MD  diltiazem (CARDIZEM CD) 120 MG 24 hr capsule Take 1 capsule (120 mg total) by mouth daily. 04/16/16  Yes Vaughan Basta, MD  doxazosin (CARDURA) 8 MG tablet Take 1 tablet by mouth daily. 03/01/16  Yes Historical Provider, MD  hydrALAZINE (APRESOLINE) 25 MG tablet Take 1 tablet (25 mg total) by mouth every 8 (eight) hours. 04/15/16  Yes Vaughan Basta, MD  hydrochlorothiazide (HYDRODIURIL) 12.5 MG tablet Take 1 tablet (12.5 mg total) by mouth daily. 04/16/16  Yes Vaughan Basta, MD  HYDROcodone-acetaminophen (NORCO/VICODIN) 5-325 MG tablet Take 1 tablet by mouth every 6 (six) hours as needed for moderate pain. 04/15/16  Yes Vaughan Basta, MD  potassium chloride SA (K-DUR,KLOR-CON) 20 MEQ tablet Take 1 tablet (20 mEq total) by mouth 2 (two) times daily. 04/15/16  Yes Vaughan Basta, MD  senna-docusate (SENOKOT-S) 8.6-50 MG tablet Take 1 tablet by mouth at bedtime as needed for mild constipation. 04/15/16  Yes Vaughan Basta, MD  amoxicillin-clavulanate (AUGMENTIN) 875-125 MG tablet Take 1 tablet by mouth every 12 (twelve) hours. Patient not taking: Reported on 04/16/2016 04/11/16 04/21/16  Eula Listen, MD    REVIEW OF SYSTEMS:  Review of Systems  Constitutional: Positive for malaise/fatigue. Negative for chills, fever and weight loss.  HENT: Negative for ear pain, hearing  loss and tinnitus.   Eyes: Negative for blurred vision, double vision, pain and redness.  Respiratory: Negative for cough, hemoptysis and shortness of breath.   Cardiovascular: Positive for palpitations. Negative for chest pain, orthopnea and leg swelling.  Gastrointestinal: Negative for abdominal pain, constipation, diarrhea, nausea and vomiting.  Genitourinary: Negative for dysuria, frequency and hematuria.  Musculoskeletal: Negative for back pain, joint pain and neck pain.   Skin:       No acne, rash, or lesions  Neurological: Positive for loss of consciousness and weakness. Negative for dizziness, tremors and focal weakness.  Endo/Heme/Allergies: Negative for polydipsia. Does not bruise/bleed easily.  Psychiatric/Behavioral: Negative for depression. The patient is not nervous/anxious and does not have insomnia.      VITAL SIGNS:   Vitals:   04/16/16 1930 04/16/16 2000 04/16/16 2030 04/16/16 2100  BP: (!) 157/62 (!) 146/72 138/61 (!) 133/59  Pulse: 98 (!) 101 (!) 102 (!) 102  Resp: 20 20 (!) 21 18  Temp:      TempSrc:      SpO2: 92% 94% 94% 95%  Weight:      Height:       Wt Readings from Last 3 Encounters:  04/16/16 65.8 kg (145 lb)  04/12/16 65.9 kg (145 lb 3.2 oz)  04/07/16 63.5 kg (140 lb)    PHYSICAL EXAMINATION:  Physical Exam  Vitals reviewed. Constitutional: She is oriented to person, place, and time. She appears well-developed and well-nourished. No distress.  HENT:  Head: Normocephalic and atraumatic.  Mouth/Throat: Oropharynx is clear and moist.  Eyes: Conjunctivae and EOM are normal. Pupils are equal, round, and reactive to light. No scleral icterus.  Neck: Normal range of motion. Neck supple. No JVD present. No thyromegaly present.  Cardiovascular: Normal rate and intact distal pulses.  Exam reveals no gallop and no friction rub.   No murmur heard. Irregular rhythm  Respiratory: Effort normal and breath sounds normal. No respiratory distress. She has no wheezes. She has no rales.  GI: Soft. Bowel sounds are normal. She exhibits no distension. There is no tenderness.  Musculoskeletal: Normal range of motion. She exhibits no edema.  No arthritis, no gout  Lymphadenopathy:    She has no cervical adenopathy.  Neurological: She is alert and oriented to person, place, and time. No cranial nerve deficit.  No dysarthria, no aphasia  Skin: Skin is warm and dry. No rash noted. No erythema.  Psychiatric: She has a normal mood and  affect. Her behavior is normal. Judgment and thought content normal.    LABORATORY PANEL:   CBC  Recent Labs Lab 04/16/16 1211  WBC 9.0  HGB 11.7*  HCT 34.4*  PLT 463*   ------------------------------------------------------------------------------------------------------------------  Chemistries   Recent Labs Lab 04/15/16 0504 04/16/16 1211  NA  --  135  K  --  3.6  CL  --  102  CO2  --  24  GLUCOSE  --  139*  BUN  --  20  CREATININE  --  1.63*  CALCIUM  --  9.6  MG 1.6*  --    ------------------------------------------------------------------------------------------------------------------  Cardiac Enzymes  Recent Labs Lab 04/16/16 1211  TROPONINI 0.03*   ------------------------------------------------------------------------------------------------------------------  RADIOLOGY:  No results found.  EKG:   Orders placed or performed during the hospital encounter of 04/16/16  . ED EKG  . ED EKG  . EKG 12-Lead  . EKG 12-Lead    IMPRESSION AND PLAN:  Principal Problem:   Syncope - suspect there may be an  element of A. fib with intermittent RVR.  However, we will admit her for observation tonight, get a cardiology consult the morning. Active Problems:   Elevated troponin - only barely elevated. We will trend it tonight.   AKI (acute kidney injury) ([place]) - gentle IV fluids tonight, avoid nephrotoxins, monitor for expected improvement   Atrial fibrillation ([place]) - currently rate controlled, continue current medications, cardiology consult as above.   Generalized weakness - persistent with deconditioning, get PT eval   HTN (hypertension) - currently stable, continue current medications  All the records are reviewed and case discussed with ED provider. Management plans discussed with the patient and/or family.  DVT PROPHYLAXIS: Mechanical only  GI PROPHYLAXIS: None  ADMISSION STATUS: Observation  CODE STATUS: Full Code Status History    Date  Active Date Inactive Code Status Order ID Comments User Context   04/12/2016  2:51 AM 04/15/2016  7:16 PM Full Code [id number]  Harvie Bridge, DO Inpatient      TOTAL TIME TAKING CARE OF THIS PATIENT: 40 minutes.    Eliberto Sole [place] 04/16/2016, 9:27 PM  [facility]  Office  [phone]  CC: Primary care physician; Marcello Fennel, MD

## 2016-04-16 NOTE — ED Notes (Signed)
Pt's family requesting orthostatic vital signs be repeated.  Pt states she's okay going through procedure

## 2016-04-16 NOTE — ED Provider Notes (Signed)
Time Seen: Approximately 1619  I have reviewed the triage notes  Chief Complaint: Fall and Loss of Consciousness   History of Present Illness: Paula Clark is a 75 y.o. female who was just recently discharged from the hospital for workup for new onset atrial fibrillation. She is currently not on any anticoagulation therapy. The family reports that the patient had some generalized weakness while she was here in the hospital. She denies any chest pain or shortness of breath. There may have been one episode of return of atrial fibrillation this morning. Patient describes generalized weakness and had first near-syncopal symptoms and may have had a brief syncopal episode. She states she feels better at this time with no pre-existing or post syncopal chest pain or shortness of breath. There was no witnessed seizure activity. She denies any focal weakness in either upper or lower extremities. She describes feeling lightheaded on numerous occasions. The patient was scheduled to have a right-sided nasal packing removed by the ear nose and throat physician today and had difficulty making it to the appointment due to generalized weakness.   Past Medical History:  Diagnosis Date  . Epistaxis   . Gout   . HLD (hyperlipidemia)   . Hypertension     Patient Active Problem List   Diagnosis Date Noted  . Epistaxis 04/13/2016  . Atrial fibrillation ([place]) 04/13/2016  . Generalized weakness   . Hypokalemia   . Anemia   . Hypertensive urgency 04/12/2016    Past Surgical History:  Procedure Laterality Date  . APPENDECTOMY    . TONSILLECTOMY      Past Surgical History:  Procedure Laterality Date  . APPENDECTOMY    . TONSILLECTOMY      Current Outpatient Rx  . Order #: [id number] Class: Print  . [START ON 04/20/2016] Order #: [id number] Class: Normal  . Order #: [id number] Class: Historical Med  . Order #: [id number] Class: Print  . Order #: [id number] Class: Historical Med  . Order #:  [id number] Class: Print  . Order #: [id number] Class: Print  . Order #: [id number] Class: Print  . Order #: [id number] Class: Print  . Order #: [id number] Class: Print    Allergies:  Codeine  Family History: Family History  Problem Relation Age of Onset  . Heart failure Mother     Social History: Social History  Substance Use Topics  . Smoking status: Former Research scientist (life sciences)  . Smokeless tobacco: Never Used  . Alcohol use No     Review of Systems:   10 point review of systems was performed and was otherwise negative:  Constitutional: No fever Eyes: No visual disturbances ENT: No sore throat, ear pain Cardiac: No chest pain Respiratory: No shortness of breath, wheezing, or stridor Abdomen: No abdominal pain, no vomiting, No diarrhea Endocrine: No weight loss, No night sweats Extremities: No peripheral edema, cyanosis Skin: No rashes, easy bruising Neurologic: No focal weakness, trouble with speech or swollowing Urologic: No dysuria, Hematuria, or urinary frequency Denies any melena or hematochezia.  Physical Exam:  ED Triage Vitals  Enc Vitals Group     BP 04/16/16 1200 (!) 113/55     Pulse Rate 04/16/16 1200 83     Resp 04/16/16 1200 18     Temp 04/16/16 1200 97.9 F (36.6 C)     Temp Source 04/16/16 1200 Oral     SpO2 04/16/16 1200 97 %     Weight 04/16/16 1201 145 lb (65.8 kg)     Height 04/16/16 1201 5\' 3"  (1.6 m)  Head Circumference --      Peak Flow --      Pain Score 04/16/16 1201 0     Pain Loc --      Pain Edu? --      Excl. in [place]? --     General: Awake , Alert , and Oriented times 3; GCS 15 Head: Normal cephalic , atraumatic Eyes: Pupils equal , round, reactive to light Nose/Throat: Old nasal packing right nares with dark crusting of the surrounding it. Certainly no active bleeding Neck: Supple, Full range of motion, No anterior adenopathy or palpable thyroid masses Lungs: Clear to ascultation without wheezes , rhonchi, or rales Heart: Regular rate, regular  rhythm without murmurs , gallops , or rubs Abdomen: Soft, non tender without rebound, guarding , or rigidity; bowel sounds positive and symmetric in all 4 quadrants. No organomegaly .        Extremities: 2 plus symmetric pulses. No edema, clubbing or cyanosis Neurologic: normal ambulation, Motor symmetric without deficits, sensory intact Skin: warm, dry, no rashes   Labs:   All laboratory work was reviewed including any pertinent negatives or positives listed below:  Labs Reviewed  BASIC METABOLIC PANEL - Abnormal; Notable for the following:       Result Value   Glucose, Bld 139 (*)    Creatinine, Ser 1.63 (*)    GFR calc non Af Amer 29 (*)    GFR calc Af Amer 34 (*)    All other components within normal limits  CBC - Abnormal; Notable for the following:    Hemoglobin 11.7 (*)    HCT 34.4 (*)    RDW 14.6 (*)    Platelets 463 (*)    All other components within normal limits  URINALYSIS COMPLETEWITH MICROSCOPIC ([facility] ONLY)  TROPONIN I    EKG:  ED ECG REPORT I, Daymon Larsen, the attending physician, personally viewed and interpreted this ECG.  Date: 04/16/2016 EKG Time: *1639 Rate: *81 Rhythm: Atrial fibrillation QRS Axis: normal Intervals: normal ST/T Wave abnormalities: normal Conduction Disturbances: none Narrative Interpretation: unremarkable No acute ischemic changes  ED Course:  Patient's stay here showed some symptomatic improvement. She still has some generalized weakness and had a syncopal episode. I added a urine culture. Patient does not appear to have cardiogenic syncope based on her description of gradual loss of consciousness. The family was still concerned due to the patient being symptomatic whenever she ambulates. Clinical Course     Assessment:  Syncope Dehydration Possible urinary tract infection Recent hospital stay for new onset atrial fibrillation Recent nosebleed     Plan: Patient's case was reviewed with the hospitalist team,  further disposition and management depends upon their evaluation.            Daymon Larsen, MD 04/16/16 2103

## 2016-04-17 DIAGNOSIS — N179 Acute kidney failure, unspecified: Secondary | ICD-10-CM | POA: Diagnosis not present

## 2016-04-17 DIAGNOSIS — I4891 Unspecified atrial fibrillation: Secondary | ICD-10-CM | POA: Diagnosis not present

## 2016-04-17 DIAGNOSIS — R748 Abnormal levels of other serum enzymes: Secondary | ICD-10-CM | POA: Diagnosis not present

## 2016-04-17 DIAGNOSIS — R55 Syncope and collapse: Secondary | ICD-10-CM | POA: Diagnosis not present

## 2016-04-17 LAB — CBC
HCT: 31.6 % — ABNORMAL LOW (ref 35.0–47.0)
Hemoglobin: 10.6 g/dL — ABNORMAL LOW (ref 12.0–16.0)
MCH: 29.8 pg (ref 26.0–34.0)
MCHC: 33.6 g/dL (ref 32.0–36.0)
MCV: 88.6 fL (ref 80.0–100.0)
PLATELETS: 447 10*3/uL — AB (ref 150–440)
RBC: 3.57 MIL/uL — ABNORMAL LOW (ref 3.80–5.20)
RDW: 15.2 % — AB (ref 11.5–14.5)
WBC: 9.4 10*3/uL (ref 3.6–11.0)

## 2016-04-17 LAB — BASIC METABOLIC PANEL
Anion gap: 6 (ref 5–15)
BUN: 21 mg/dL — AB (ref 6–20)
CALCIUM: 8.9 mg/dL (ref 8.9–10.3)
CO2: 26 mmol/L (ref 22–32)
CREATININE: 1.3 mg/dL — AB (ref 0.44–1.00)
Chloride: 105 mmol/L (ref 101–111)
GFR calc Af Amer: 45 mL/min — ABNORMAL LOW (ref >60)
GFR, EST NON AFRICAN AMERICAN: 39 mL/min — AB (ref >60)
Glucose, Bld: 123 mg/dL — ABNORMAL HIGH (ref 65–99)
Potassium: 3.7 mmol/L (ref 3.5–5.1)
SODIUM: 137 mmol/L (ref 135–145)

## 2016-04-17 LAB — TROPONIN I: TROPONIN I: 0.03 ng/mL — AB (ref <0.03)

## 2016-04-17 MED ORDER — AMOXICILLIN-POT CLAVULANATE 875-125 MG PO TABS
1.0000 | ORAL_TABLET | Freq: Two times a day (BID) | ORAL | Status: DC
  Administered 2016-04-17 – 2016-04-18 (×3): 1 via ORAL
  Filled 2016-04-17 (×3): qty 1

## 2016-04-17 NOTE — Evaluation (Signed)
Physical Therapy Evaluation Patient Details Name: Paula Clark MRN: [id number] DOB: 04/15/1941 Today's Date: 04/17/2016   History of Present Illness  75 y/o female who was here earlier this weak with blood pressure issues.  She discharged home and had a syncopal episode and returned for further work up.  Clinical Impression  Pt did well with ambulation using walker (occasionally uses cane at baseline, normally no AD) but did not feel confident w/o it.  She was able to go ~100 ft with no LOBs and only minimal fatigue but overall is not at her baseline.  She likely does not need further PT follow up at home, but would benefit from continued work with PT here to increase/assess activity tolerance.  Pt would benefit from having a walker at home secondary to some feeling of unsteadiness, continued training here would be beneficial.      Follow Up Recommendations No PT follow up    Equipment Recommendations  Rolling walker with 5" wheels    Recommendations for Other Services       Precautions / Restrictions Precautions Precautions: Fall Restrictions Weight Bearing Restrictions: No      Mobility  Bed Mobility Overal bed mobility: Independent             General bed mobility comments: Pt rises to EOB w/o issue, shows good confidence and safety and has no dizziness, etc  Transfers Overall transfer level: Modified independent Equipment used: Rolling walker (2 wheeled);Straight cane Transfers: Sit to/from Stand Sit to Stand: Supervision         General transfer comment: Pt did well getting to standing and though she reports some general fatigue she has no safety issues and needs only minimal cuing.  Pt was reliant on the walker to maintain balance.   Ambulation/Gait Ambulation/Gait assistance: Modified independent (Device/Increase time) Ambulation Distance (Feet): 100 Feet Assistive device: Rolling walker (2 wheeled)       General Gait Details: Pt generally only needs  SPC, but feels as though she needed walker today secondary to general feeling of weakness/fatigue and some mild unsteadiness.  Stairs            Wheelchair Mobility    Modified Rankin (Stroke Patients Only)       Balance Overall balance assessment: Modified Independent   Sitting balance-Leahy Scale: Good       Standing balance-Leahy Scale: Good Standing balance comment: Pt needing UEs on walker to maintain safe balance                             Pertinent Vitals/Pain Pain Assessment: No/denies pain    Home Living Family/patient expects to be discharged to:: Private residence Living Arrangements: Alone Available Help at Discharge: Family Type of Home: Apartment Home Access: Stairs to enter Entrance Stairs-Rails: None Entrance Stairs-Number of Steps: 1 step  Home Layout: One level Home Equipment: Cane - single point Additional Comments: Pt. uses cane intermittently if she feels she needs it    Prior Function Level of Independence: Independent with assistive device(s)         Comments: Pt. reports independence with all ADL's prior, drives, grocery shopping, prepares meals      Hand Dominance        Extremity/Trunk Assessment   Upper Extremity Assessment: Overall WFL for tasks assessed           Lower Extremity Assessment: Overall WFL for tasks assessed  Communication   Communication: No difficulties  Cognition Arousal/Alertness: Awake/alert Behavior During Therapy: WFL for tasks assessed/performed Overall Cognitive Status: Within Functional Limits for tasks assessed                      General Comments      Exercises     Assessment/Plan    PT Assessment Patient needs continued PT services  PT Problem List Decreased strength;Decreased activity tolerance;Decreased balance;Decreased safety awareness;Decreased knowledge of use of DME          PT Treatment Interventions DME instruction;Gait training;Stair  training;Therapeutic exercise;Therapeutic activities;Functional mobility training;Patient/family education;Balance training    PT Goals (Current goals can be found in the Care Plan section)  Acute Rehab PT Goals Patient Stated Goal: Pt. would like to return to her apt.  PT Goal Formulation: With patient Time For Goal Achievement: 05/01/16 Potential to Achieve Goals: Good    Frequency Min 2X/week   Barriers to discharge        Co-evaluation               End of Session Equipment Utilized During Treatment: Gait belt Activity Tolerance: Patient tolerated treatment well Patient left: with bed alarm set;with call bell/phone within reach      Functional Assessment Tool Used: clinical judgement Functional Limitation: Mobility: Walking and moving around Mobility: Walking and Moving Around Current Status [id number]): At least 1 percent but less than 20 percent impaired, limited or restricted Mobility: Walking and Moving Around Goal Status [phone]): 0 percent impaired, limited or restricted    Time: 0812-0832 PT Time Calculation (min) (ACUTE ONLY): 20 min   Charges:   PT Evaluation $PT Eval Low Complexity: 1 Procedure     PT G Codes:   PT G-Codes **NOT FOR INPATIENT CLASS** Functional Assessment Tool Used: clinical judgement Functional Limitation: Mobility: Walking and moving around Mobility: Walking and Moving Around Current Status [id number]): At least 1 percent but less than 20 percent impaired, limited or restricted Mobility: Walking and Moving Around Goal Status [phone]): 0 percent impaired, limited or restricted    Kreg Shropshire, DPT 04/17/2016, 10:46 AM

## 2016-04-17 NOTE — Progress Notes (Signed)
Dr. Verdell Carmine notified of critical value of troponin 0.03.  He acknowledged, no new orders.

## 2016-04-17 NOTE — Progress Notes (Signed)
[facility] [place] NAME: Paula Clark    MR#:  [id number]  DATE OF BIRTH:  January 23, 1941  SUBJECTIVE:   Pt. Here due to syncope and weakness.  Recently discharged home after an episode of epistaxis.  No further epistaxis presently.  No other complaints presently.  REVIEW OF SYSTEMS:    Review of Systems  Constitutional: Negative for chills and fever.  HENT: Negative for congestion and tinnitus.   Eyes: Negative for blurred vision and double vision.  Respiratory: Negative for cough, shortness of breath and wheezing.   Cardiovascular: Negative for chest pain, orthopnea and PND.  Gastrointestinal: Negative for abdominal pain, diarrhea, nausea and vomiting.  Genitourinary: Negative for dysuria and hematuria.  Neurological: Positive for weakness. Negative for dizziness, sensory change and focal weakness.  All other systems reviewed and are negative.   Nutrition: Heart healthy Tolerating Diet: Yes Tolerating PT: Evaluation noted   DRUG ALLERGIES:   Allergies  Allergen Reactions  . Codeine Nausea And Vomiting    VITALS:  Blood pressure (!) 133/47, pulse (!) 106, temperature 98.1 F (36.7 C), temperature source Oral, resp. rate 18, height 5\' 3"  (1.6 m), weight 61.8 kg (136 lb 4.8 oz), SpO2 95 %.  PHYSICAL EXAMINATION:   Physical Exam  GENERAL:  75 y.o.-year-old patient lying in bed in no acute distress.  EYES: Pupils equal, round, reactive to light and accommodation. No scleral icterus. Extraocular muscles intact.  HEENT: Head atraumatic, normocephalic. Oropharynx and nasopharynx clear.  NECK:  Supple, no jugular venous distention. No thyroid enlargement, no tenderness.  LUNGS: Normal breath sounds bilaterally, no wheezing, rales, rhonchi. No use of accessory muscles of respiration.  CARDIOVASCULAR: S1, S2 normal. II/VI SEM at RSB, No rubs, or gallops.  ABDOMEN: Soft, nontender, nondistended. Bowel sounds present. No organomegaly or  mass.  EXTREMITIES: No cyanosis, clubbing or edema b/l.    NEUROLOGIC: Cranial nerves II through XII are intact. No focal Motor or sensory deficits b/l.   PSYCHIATRIC: The patient is alert and oriented x 3.  SKIN: No obvious rash, lesion, or ulcer.    LABORATORY PANEL:   CBC  Recent Labs Lab 04/17/16 0445  WBC 9.4  HGB 10.6*  HCT 31.6*  PLT 447*   ------------------------------------------------------------------------------------------------------------------  Chemistries   Recent Labs Lab 04/15/16 0504  04/17/16 0445  NA  --   < > 137  K  --   < > 3.7  CL  --   < > 105  CO2  --   < > 26  GLUCOSE  --   < > 123*  BUN  --   < > 21*  CREATININE  --   < > 1.30*  CALCIUM  --   < > 8.9  MG 1.6*  --   --   < > = values in this interval not displayed. ------------------------------------------------------------------------------------------------------------------  Cardiac Enzymes  Recent Labs Lab 04/17/16 1025  TROPONINI 0.03*   ------------------------------------------------------------------------------------------------------------------  RADIOLOGY:  No results found.   ASSESSMENT AND PLAN:   75 year old female with past medical history of attention, hyperlipidemia, gout, epistaxis, atrial fibrillation who presented to the hospital due to syncope and weakness.   1. Syncope/weakness-etiology unclear. Patient has been observed on telemetry with no evidence of worsening arrhythmias. Atrial fibrillation is rate controlled. -No evidence of orthostasis. Cardiac markers have been essentially benign. -Await further cardiology input.  2. Epistaxis - resolved now.  - nasal packing removed.  Cont. Augmentin.  Outpatient ENT follow up.  3. Hx of a. Fib - rate controlled.  - cont. Cardizem.   4. HTN - cont. Hydralazine, Cardizem.    All the records are reviewed and case discussed with Care Management/Social Worker. Management plans discussed with the patient,  family and they are in agreement.  CODE STATUS: Full Code  DVT Prophylaxis: TED's & SCD's  TOTAL TIME TAKING CARE OF THIS PATIENT: 30 minutes.   POSSIBLE D/C IN 1-2 DAYS, DEPENDING ON CLINICAL CONDITION.   Henreitta Leber M.D on 04/17/2016 at 2:46 PM  Between 7am to 6pm - Pager - [phone]  After 6pm go to [email or web address] - Proofreader  [facility]  Office  [phone]  CC: Primary care physician; BABAOFF, Caryl Bis, MD

## 2016-04-18 DIAGNOSIS — I4891 Unspecified atrial fibrillation: Secondary | ICD-10-CM | POA: Diagnosis not present

## 2016-04-18 DIAGNOSIS — R55 Syncope and collapse: Secondary | ICD-10-CM

## 2016-04-18 DIAGNOSIS — R748 Abnormal levels of other serum enzymes: Secondary | ICD-10-CM | POA: Diagnosis not present

## 2016-04-18 DIAGNOSIS — N179 Acute kidney failure, unspecified: Secondary | ICD-10-CM | POA: Diagnosis not present

## 2016-04-18 LAB — URINE CULTURE: Special Requests: NORMAL

## 2016-04-18 MED ORDER — DILTIAZEM HCL ER COATED BEADS 120 MG PO CP24
120.0000 mg | ORAL_CAPSULE | Freq: Once | ORAL | Status: AC
  Administered 2016-04-18: 120 mg via ORAL

## 2016-04-18 MED ORDER — DILTIAZEM HCL ER COATED BEADS 240 MG PO CP24: 240.0000 mg | ORAL_CAPSULE | Freq: Every day | ORAL | 1 refills | Status: DC

## 2016-04-18 MED ORDER — DILTIAZEM HCL ER COATED BEADS 240 MG PO CP24
240.0000 mg | ORAL_CAPSULE | Freq: Every day | ORAL | Status: DC
Start: 2016-04-19 — End: 2016-04-18

## 2016-04-18 NOTE — Progress Notes (Signed)
Ambulated pt around the nurses station and tolerated it well, however HR went to 130s. Pt was asymptomatic.

## 2016-04-18 NOTE — Care Management Note (Addendum)
Case Management Note  Patient Details  Name: Paula Clark MRN: [id number] Date of Birth: 1940-08-01  Subjective/Objective:      New order for a rolling walker was faxed to [facility]. [facility] will deliver a wrolling walker to [name]'s hospital room today. [name]'s nurse was advised that the RW would be delivered today, hopefully within a couple of hours but possibly around 5pm or after. No home health services ordered.              Action/Plan:   Expected Discharge Date:  04/18/16               Expected Discharge Plan:     In-House Referral:     Discharge planning Services     Post Acute Care Choice:    Choice offered to:     DME Arranged:    DME Agency:     HH Arranged:    HH Agency:     Status of Service:     If discussed at [employer], dates discussed:    Additional Comments:  Ancelmo Hunt A, RN 04/18/2016, 12:16 PM

## 2016-04-18 NOTE — Progress Notes (Signed)
A & O. Ambulated in the hallway and tolerated it well. Takes meds ok. IV and tele removed. Prescription given to pt. Discharge instructions reviewed with daughter. Pt waiting for a walker. Pt has no further concerns at this time.

## 2016-04-18 NOTE — Discharge Summary (Signed)
[facility] [place] NAME: Paula Clark    MR#:  [id number]  DATE OF BIRTH:  23-Jan-1941  DATE OF ADMISSION:  04/16/2016 ADMITTING PHYSICIAN: Lance Coon, MD  DATE OF DISCHARGE: 04/18/2016  PRIMARY CARE PHYSICIAN: BABAOFF, MARC E, MD    ADMISSION DIAGNOSIS:  Syncope and collapse [R55] Dehydration [E86.0]  DISCHARGE DIAGNOSIS:  Principal Problem:   Syncope Active Problems:   Atrial fibrillation (HCC)   Generalized weakness   Elevated troponin   HTN (hypertension)   AKI (acute kidney injury) ([place])   SECONDARY DIAGNOSIS:   Past Medical History:  Diagnosis Date  . Atrial fibrillation ([place])   . Epistaxis   . Gout   . HLD (hyperlipidemia)   . Hypertension     HOSPITAL COURSE:   75 year old female with past medical history of attention, hyperlipidemia, gout, epistaxis, atrial fibrillation who presented to the hospital due to syncope and weakness.  1. Syncope/weakness-secondary to deconditioning and a. Fib.  -Patient was observed on telemetry, did not have any worsening arrhythmias. Her orthostatic vital signs were negative. Patient presented with hypotension physical therapy and did not require any services other than a rolling walker at home.    2. Epistaxis - resolved now.  - nasal packing removed.  cont. To finish Augmentin as outpatient. Outpatient ENT follow up.   3. Hx of a. Fib - patient's rates were little bit labile on ambulation prior to discharge. -Her Cardizem dose was increased as per cardiology.  4. HTN - cont. Hydralazine, Cardizem.    DISCHARGE CONDITIONS:   Stable  CONSULTS OBTAINED:  Treatment Team:  Minna Merritts, MD  DRUG ALLERGIES:   Allergies  Allergen Reactions  . Codeine Nausea And Vomiting    DISCHARGE MEDICATIONS:     Medication List    TAKE these medications   amoxicillin-clavulanate 875-125 MG tablet Commonly known as:  AUGMENTIN Take 1 tablet by mouth every 12 (twelve)  hours.   aspirin EC 81 MG tablet Take 1 tablet (81 mg total) by mouth daily. Start taking on:  04/20/2016   Calcium Carbonate-Vitamin D 600-400 MG-UNIT tablet Take 1 tablet by mouth daily.   diltiazem 240 MG 24 hr capsule Commonly known as:  CARDIZEM CD Take 1 capsule (240 mg total) by mouth daily. Start taking on:  04/19/2016 What changed:  medication strength  how much to take   doxazosin 8 MG tablet Commonly known as:  CARDURA Take 1 tablet by mouth daily.   hydrALAZINE 25 MG tablet Commonly known as:  APRESOLINE Take 1 tablet (25 mg total) by mouth every 8 (eight) hours.   hydrochlorothiazide 12.5 MG tablet Commonly known as:  HYDRODIURIL Take 1 tablet (12.5 mg total) by mouth daily.   HYDROcodone-acetaminophen 5-325 MG tablet Commonly known as:  NORCO/VICODIN Take 1 tablet by mouth every 6 (six) hours as needed for moderate pain.   potassium chloride SA 20 MEQ tablet Commonly known as:  K-DUR,KLOR-CON Take 1 tablet (20 mEq total) by mouth 2 (two) times daily.   senna-docusate 8.6-50 MG tablet Commonly known as:  Senokot-S Take 1 tablet by mouth at bedtime as needed for mild constipation.         DISCHARGE INSTRUCTIONS:   DIET:  Cardiac diet  DISCHARGE CONDITION:  Stable  ACTIVITY:  Activity as tolerated  OXYGEN:  Home Oxygen: No.   Oxygen Delivery: room air  DISCHARGE LOCATION:  home   If you experience worsening of your admission symptoms, develop shortness of  breath, life threatening emergency, suicidal or homicidal thoughts you must seek medical attention immediately by calling 911 or calling your MD immediately  if symptoms less severe.  You Must read complete instructions/literature along with all the possible adverse reactions/side effects for all the Medicines you take and that have been prescribed to you. Take any new Medicines after you have completely understood and accpet all the possible adverse reactions/side effects.   Please  note  You were cared for by a hospitalist during your hospital stay. If you have any questions about your discharge medications or the care you received while you were in the hospital after you are discharged, you can call the unit and asked to speak with the hospitalist on call if the hospitalist that took care of you is not available. Once you are discharged, your primary care physician will handle any further medical issues. Please note that NO REFILLS for any discharge medications will be authorized once you are discharged, as it is imperative that you return to your primary care physician (or establish a relationship with a primary care physician if you do not have one) for your aftercare needs so that they can reassess your need for medications and monitor your lab values.     Today   No further epistaxis.  Feels better.  NO weakness, dizziness.   VITAL SIGNS:  Blood pressure 132/75, pulse 88, temperature 97.9 F (36.6 C), temperature source Oral, resp. rate 18, height 5\' 3"  (1.6 m), weight 61.8 kg (136 lb 4.8 oz), SpO2 99 %.  I/O:   Intake/Output Summary (Last 24 hours) at 04/18/16 1328 Last data filed at 04/18/16 1131  Gross per 24 hour  Intake              480 ml  Output             2000 ml  Net            -1520 ml    PHYSICAL EXAMINATION:  GENERAL:  75 y.o.-year-old patient lying in the bed with no acute distress.  EYES: Pupils equal, round, reactive to light and accommodation. No scleral icterus. Extraocular muscles intact.  HEENT: Head atraumatic, normocephalic. Oropharynx and nasopharynx clear.  NECK:  Supple, no jugular venous distention. No thyroid enlargement, no tenderness.  LUNGS: Normal breath sounds bilaterally, no wheezing, rales,rhonchi. No use of accessory muscles of respiration.  CARDIOVASCULAR: S1, S2 normal. No murmurs, rubs, or gallops.  ABDOMEN: Soft, non-tender, non-distended. Bowel sounds present. No organomegaly or mass.  EXTREMITIES: No pedal edema,  cyanosis, or clubbing.  NEUROLOGIC: Cranial nerves II through XII are intact. No focal motor or sensory defecits b/l.  PSYCHIATRIC: The patient is alert and oriented x 3. Good affect.  SKIN: No obvious rash, lesion, or ulcer.   DATA REVIEW:   CBC  Recent Labs Lab 04/17/16 0445  WBC 9.4  HGB 10.6*  HCT 31.6*  PLT 447*    Chemistries   Recent Labs Lab 04/15/16 0504  04/17/16 0445  NA  --   < > 137  K  --   < > 3.7  CL  --   < > 105  CO2  --   < > 26  GLUCOSE  --   < > 123*  BUN  --   < > 21*  CREATININE  --   < > 1.30*  CALCIUM  --   < > 8.9  MG 1.6*  --   --   < > =  values in this interval not displayed.  Cardiac Enzymes  Recent Labs Lab 04/17/16 1025  TROPONINI 0.03*    Microbiology Results  Results for orders placed or performed during the hospital encounter of 04/16/16  Urine culture     Status: Abnormal   Collection Time: 04/16/16  6:46 PM  Result Value Ref Range Status   Specimen Description URINE, CLEAN CATCH  Final   Special Requests Normal  Final   Culture MULTIPLE SPECIES PRESENT, SUGGEST RECOLLECTION (A)  Final   Report Status 04/18/2016 FINAL  Final    RADIOLOGY:  No results found.    Management plans discussed with the patient, family and they are in agreement.  CODE STATUS:     Code Status Orders        Start     Ordered   04/16/16 2258  Full code  Continuous     04/16/16 2257    Code Status History    Date Active Date Inactive Code Status Order ID Comments User Context   04/12/2016  2:51 AM 04/15/2016  7:16 PM Full Code [id number]  Harvie Bridge, DO Inpatient      TOTAL TIME TAKING CARE OF THIS PATIENT: 40 minutes.    Henreitta Leber M.D on 04/18/2016 at 1:28 PM  Between 7am to 6pm - Pager - [phone]  After 6pm go to [email or web address] - Proofreader  [facility]  Office  [phone]  CC: Primary care physician; BABAOFF, Caryl Bis, MD

## 2016-04-18 NOTE — Progress Notes (Signed)
.  Patient ID: Paula Clark, female   DOB: 10/28/1940, 75 y.o.   MRN: [id number]    Subjective:  Denies SSCP, palpitations or Dyspnea No epistaxis Seen by cardiology on 10/5 HR up to 130 with ambulation   Objective:  Vitals:   04/17/16 2008 04/18/16 0431 04/18/16 0434 04/18/16 0437  BP: (!) 122/59 (!) 128/55 (!) 153/61 (!) 115/59  Pulse: 83 77 (!) 128 (!) 115  Resp: 18 18    Temp: 98.1 F (36.7 C) 97.9 F (36.6 C)    TempSrc: Oral Oral    SpO2: 98% 96% 97% 99%  Weight:      Height:        Intake/Output from previous day:  Intake/Output Summary (Last 24 hours) at 04/18/16 1101 Last data filed at 04/18/16 0700  Gross per 24 hour  Intake              572 ml  Output             1800 ml  Net            -1228 ml    Physical Exam: Affect appropriate Pale elderly female  HEENT: normal Neck supple with no adenopathy JVP normal no bruits no thyromegaly Lungs clear with no wheezing and good diaphragmatic motion Heart:  S1/S2 no murmur, no rub, gallop or click PMI normal Abdomen: benighn, BS positve, no tenderness, no AAA no bruit.  No HSM or HJR Distal pulses intact with no bruits No edema Neuro non-focal Skin warm and dry No muscular weakness   Lab Results: Basic Metabolic Panel:  Recent Labs  04/16/16 1211 04/17/16 0445  NA 135 137  K 3.6 3.7  CL 102 105  CO2 24 26  GLUCOSE 139* 123*  BUN 20 21*  CREATININE 1.63* 1.30*  CALCIUM 9.6 8.9   Liver Function Tests: No results for input(s): AST, ALT, ALKPHOS, BILITOT, PROT, ALBUMIN in the last 72 hours. No results for input(s): LIPASE, AMYLASE in the last 72 hours. CBC:  Recent Labs  04/16/16 1211 04/17/16 0445  WBC 9.0 9.4  HGB 11.7* 10.6*  HCT 34.4* 31.6*  MCV 89.2 88.6  PLT 463* 447*   Cardiac Enzymes:  Recent Labs  04/16/16 2303 04/17/16 0445 04/17/16 1025  TROPONINI <0.03 <0.03 0.03*    Imaging: No results found.  Cardiac Studies:  ECG:  afib nonspecific ST/T wave changes     Telemetry:  afib rate 100's   Echo:   Medications:   . amoxicillin-clavulanate  1 tablet Oral Q12H  . diltiazem  120 mg Oral Daily  . doxazosin  8 mg Oral Daily  . hydrALAZINE  25 mg Oral Q8H  . sodium chloride flush  3 mL Intravenous Q12H       Assessment/Plan:   Afib:  No anticoagulation due to epistaxis. Increase cardizem to 240 mg daily will arrange outpatient f/u with Dr Rockey Situ ok to d/c home if rate control an issue in future can stop hydralazine and start low dose beta blocker.     Jenkins Rouge 04/18/2016, 11:01 AM

## 2016-04-18 NOTE — Progress Notes (Signed)
Pt. Slept throughout the night with no c/o pain, SOB or acute distress noted. Daughter at bedside throughout the night.

## 2016-04-18 NOTE — Progress Notes (Signed)
Pt ambulated around the nurses station and tolerated it well. HR 70s. Pt reports no chest pain.

## 2016-04-19 ENCOUNTER — Telehealth: Payer: Self-pay | Admitting: Cardiovascular Disease

## 2016-04-19 DIAGNOSIS — N390 Urinary tract infection, site not specified: Secondary | ICD-10-CM | POA: Diagnosis not present

## 2016-04-19 DIAGNOSIS — R300 Dysuria: Secondary | ICD-10-CM | POA: Diagnosis not present

## 2016-04-19 NOTE — Telephone Encounter (Signed)
Received records request from [employer], forwarded to [facility] for processing.

## 2016-04-21 ENCOUNTER — Encounter: Payer: Self-pay | Admitting: Physician Assistant

## 2016-04-21 ENCOUNTER — Other Ambulatory Visit: Payer: Self-pay | Admitting: Physician Assistant

## 2016-04-21 ENCOUNTER — Telehealth: Payer: Self-pay | Admitting: Physician Assistant

## 2016-04-21 ENCOUNTER — Ambulatory Visit (INDEPENDENT_AMBULATORY_CARE_PROVIDER_SITE_OTHER): Payer: Commercial Managed Care - HMO | Admitting: Physician Assistant

## 2016-04-21 VITALS — BP 130/60 | HR 74 | Ht 63.0 in | Wt 143.2 lb

## 2016-04-21 DIAGNOSIS — Z87898 Personal history of other specified conditions: Secondary | ICD-10-CM

## 2016-04-21 DIAGNOSIS — R040 Epistaxis: Secondary | ICD-10-CM

## 2016-04-21 DIAGNOSIS — I481 Persistent atrial fibrillation: Secondary | ICD-10-CM | POA: Diagnosis not present

## 2016-04-21 DIAGNOSIS — I4819 Other persistent atrial fibrillation: Secondary | ICD-10-CM

## 2016-04-21 DIAGNOSIS — I10 Essential (primary) hypertension: Secondary | ICD-10-CM | POA: Diagnosis not present

## 2016-04-21 DIAGNOSIS — E876 Hypokalemia: Secondary | ICD-10-CM

## 2016-04-21 DIAGNOSIS — I951 Orthostatic hypotension: Secondary | ICD-10-CM

## 2016-04-21 DIAGNOSIS — R0602 Shortness of breath: Secondary | ICD-10-CM

## 2016-04-21 MED ORDER — RIVAROXABAN 15 MG PO TABS: 15.0000 mg | ORAL_TABLET | Freq: Every day | ORAL | 11 refills | Status: DC

## 2016-04-21 NOTE — Progress Notes (Signed)
Patient to be placed on Xarelto 15 mg q dinner rather than 20 mg given CrCl < 50 mL/min based on labs from 04/17/16.

## 2016-04-21 NOTE — Progress Notes (Signed)
    Our office got in touch with Dr. Kathyrn Sheriff who reported he was under the impression the patient had already been placed on anticoagulation. Per ENT ok to start anticoagulation. Will send in Xarelto 20 mg q dinner. Give samples from the office alng with coupon card. Check bmet and cbc.

## 2016-04-21 NOTE — Telephone Encounter (Signed)
Left detailed voicemail message that we needed to reschedule patient to come back in 2 weeks to see how she is tolerating the new blood thinner xarelto on 05/06/16 at 2:00 PM and that tomorrow samples and discount cards can be picked up at the window with instructions to call back if any signs of bleeding or questions.   Medication Samples have been provided to the patient.  Drug name: Xarelto       Strength: 15 mg        Qty: 2 bottles  LOT: [id number]  Exp.Date: 01-18  Dosing instructions: Take Once daily with supper  The patient has been instructed regarding the correct time, dose, and frequency of taking this medication, including desired effects and most common side effects.   Valora Corporal 5:11 PM 04/21/2016

## 2016-04-21 NOTE — Telephone Encounter (Signed)
Spoke with patients daughter and reviewed with her lab work to be done tomorrow and starting her on xarelto 15 mg once daily at dinner time. She had some concerns due to bleeding risks and reviewed with her risks of atrial fibrillation as well and she verbalized understanding. Let her know to monitor for bleeding and that if she should have any to please give [name] a call. Also left some samples up front for her to pick up tomorrow when she has her labs done along with 2 discount cards. She verbalized understanding and had no further questions.

## 2016-04-21 NOTE — Addendum Note (Signed)
Addended by: Valora Corporal on: 04/21/2016 04:34 PM   Modules accepted: Orders

## 2016-04-21 NOTE — Patient Instructions (Addendum)
Increase fluid intake   Testing/Procedures: [place]  Your caregiver has ordered a Stress Test with nuclear imaging. The purpose of this test is to evaluate the blood supply to your heart muscle. This procedure is referred to as a "Non-Invasive Stress Test." This is because other than having an IV started in your vein, nothing is inserted or "invades" your body. Cardiac stress tests are done to find areas of poor blood flow to the heart by determining the extent of coronary artery disease (CAD). Some patients exercise on a treadmill, which naturally increases the blood flow to your heart, while others who are  unable to walk on a treadmill due to physical limitations have a pharmacologic/chemical stress agent called Lexiscan . This medicine will mimic walking on a treadmill by temporarily increasing your coronary blood flow.   Please note: these test may take anywhere between 2-4 hours to complete  PLEASE REPORT TO [facility] AT THE FIRST DESK WILL DIRECT YOU WHERE TO GO  Date of Procedure:_Friday April 30, 2016 at 08:30AM __  Arrival Time for Procedure:__Arrive at 08:15AM to register___   PLEASE NOTIFY THE OFFICE AT LEAST 24 HOURS IN ADVANCE IF YOU ARE UNABLE TO [facility].  [phone] AND  PLEASE NOTIFY NUCLEAR MEDICINE AT [facility] AT LEAST 24 HOURS IN ADVANCE IF YOU ARE UNABLE TO KEEP YOUR APPOINTMENT. [phone]  How to prepare for your Myoview test:  1. Do not eat or drink after midnight 2. No caffeine for 24 hours prior to test 3. No smoking 24 hours prior to test. 4. Your medication may be taken with water.  If your doctor stopped a medication because of this test, do not take that medication. 5. Ladies, please do not wear dresses.  Skirts or pants are appropriate. Please wear a short sleeve shirt. 6. No perfume, cologne or lotion. 7. Wear comfortable walking shoes. No heels!      Follow-Up: Your physician recommends that you  schedule a follow-up appointment in: 6 weeks with Christell Faith PA.  It was a pleasure seeing you today here in the office. Please do not hesitate to give [name] a call back if you have any further questions. [place], BSN    Pharmacologic Stress Electrocardiogram A pharmacologic stress electrocardiogram is a heart (cardiac) test that uses nuclear imaging to evaluate the blood supply to your heart. This test may also be called a pharmacologic stress electrocardiography. Pharmacologic means that a medicine is used to increase your heart rate and blood pressure.  This stress test is done to find areas of poor blood flow to the heart by determining the extent of coronary artery disease (CAD). Some people exercise on a treadmill, which naturally increases the blood flow to the heart. For those people unable to exercise on a treadmill, a medicine is used. This medicine stimulates your heart and will cause your heart to beat harder and more quickly, as if you were exercising.  Pharmacologic stress tests can help determine:  The adequacy of blood flow to your heart during increased levels of activity in order to clear you for discharge home.  The extent of coronary artery blockage caused by CAD.  Your prognosis if you have suffered a heart attack.  The effectiveness of cardiac procedures done, such as an angioplasty, which can increase the circulation in your coronary arteries.  Causes of chest pain or pressure. LET [facility] CARE PROVIDER KNOW ABOUT:  Any allergies you have.  All medicines you are taking, including vitamins, herbs, eye drops, creams, and over-the-counter medicines.  Previous problems you or members of your family have had with the use of anesthetics.  Any blood disorders you have.  Previous surgeries you have had.  Medical conditions you have.  Possibility of pregnancy, if this applies.  If you are currently breastfeeding. RISKS AND  COMPLICATIONS Generally, this is a safe procedure. However, as with any procedure, complications can occur. Possible complications include:  You develop pain or pressure in the following areas:  Chest.  Jaw or neck.  Between your shoulder blades.  Radiating down your left arm.  Headache.  Dizziness or light-headedness.  Shortness of breath.  Increased or irregular heartbeat.  Low blood pressure.  Nausea or vomiting.  Flushing.  Redness going up the arm and slight pain during injection of medicine.  Heart attack (rare). BEFORE THE PROCEDURE   Avoid all forms of caffeine for 24 hours before your test or as directed by your health care provider. This includes coffee, tea (even decaffeinated tea), caffeinated sodas, chocolate, cocoa, and certain pain medicines.  Follow your health care provider's instructions regarding eating and drinking before the test.  Take your medicines as directed at regular times with water unless instructed otherwise. Exceptions may include:  If you have diabetes, ask how you are to take your insulin or pills. It is common to adjust insulin dosing the morning of the test.  If you are taking beta-blocker medicines, it is important to talk to your health care provider about these medicines well before the date of your test. Taking beta-blocker medicines may interfere with the test. In some cases, these medicines need to be changed or stopped 24 hours or more before the test.  If you wear a nitroglycerin patch, it may need to be removed prior to the test. Ask your health care provider if the patch should be removed before the test.  If you use an inhaler for any breathing condition, bring it with you to the test.  If you are an outpatient, bring a snack so you can eat right after the stress phase of the test.  Do not smoke for 4 hours prior to the test or as directed by your health care provider.  Do not apply lotions, powders, creams, or oils on  your chest prior to the test.  Wear comfortable shoes and clothing. Let your health care provider know if you were unable to complete or follow the preparations for your test. PROCEDURE   Multiple patches (electrodes) will be put on your chest. If needed, small areas of your chest may be shaved to get better contact with the electrodes. Once the electrodes are attached to your body, multiple wires will be attached to the electrodes, and your heart rate will be monitored.  An IV access will be started. A nuclear trace (isotope) is given. The isotope may be given intravenously, or it may be swallowed. Nuclear refers to several types of radioactive isotopes, and the nuclear isotope lights up the arteries so that the nuclear images are clear. The isotope is absorbed by your body. This results in low radiation exposure.  A resting nuclear image is taken to show how your heart functions at rest.  A medicine is given through the IV access.  A second scan is done about 1 hour after the medicine injection and determines how your heart functions under stress.  During this stress phase, you will be connected to an electrocardiogram machine.  Your blood pressure and oxygen levels will be monitored. AFTER THE PROCEDURE   Your heart rate and blood pressure will be monitored after the test.  You may return to your normal schedule, including diet,activities, and medicines, unless your health care provider tells you otherwise.   This information is not intended to replace advice given to you by your health care provider. Make sure you discuss any questions you have with your health care provider.   Document Released: 11/14/2008 Document Revised: 07/03/2013 Document Reviewed: 03/05/2013 Elsevier Interactive Patient Education [employer].

## 2016-04-21 NOTE — Progress Notes (Signed)
Please see detailed documentation encounter.

## 2016-04-21 NOTE — Progress Notes (Addendum)
Cardiology Office Note Date:  04/21/2016  Patient ID:  Paula Clark [birth year], MRN [id number] PCP:  Marcello Fennel, MD  Cardiologist:  Dr. Rockey Situ, MD    Chief Complaint: Hospital follow up  History of Present Illness: Paula Clark is a 75 y.o. female with history of recently diagnosed persistent Afib not on full-dose anticoagulation 2/2 severe recurrent epistaxis, poorly controlled HTN, CKD stage III, HLD, and gout who presents for hospital follow up from recent admission to [facility] from 10/1 to 10/5 for epistaxis and accelerated HTN found to have Afib and a repeat admission for syncope from 10/6-10/7.   Prior to her initial admission as above she did not have any previously known cardiac history. She had been told she had an "irregular heart beat" dating back to her 7's. No prior EKG's on file for review. She initially presented to [facility] ED on 9/27 with epistaxis requiring nasal packing. She then experienced repeat epistaxis x 4 on 10/1 leading to repeat packing and discharge from the ED. Unfortunately, while walking to her car from her ED discharge on 10/1 she had yet another nose bleed. ENT repacked her nose and recommended hospital admission. BP during each ED visit was noted to be elevated ranging from the [id number] to [id number] systolic. She was noted to be in Afib on telemetry (12-lead upon admission reportedly showed Afib, though this EKG was never found by cardiology) Repeat EKG shwed Afib, 80 bpm. TSH was normal. Magnesium 1.5, K+ 3.4, hgb 11.9. Troponin peaked at 0.03. Echo showed an EF of 60-65%, no RWMA, mild AI/MR, mildly dilated left atrium, PASP 32 mmHg. She was rated controlled with diltiazem and Toprol XL initially, though Toprol was held 2/2 bradycardia. She was not started on anticoagulaiton given her ongoing nasal bleed. There was consideration of possible Watchman device in the future if she could tolerate periprocedural anticoagulation and ASA/Plavix post procedure. She was  discharged 10/5.   She returned to [facility] on 10/6 a syncopal epsiode/weakness. Reportedly orthostatics were negative. Troponin peaked at 0.03. Hgb stable upon admission. EKG showed rate-controlled Afib. No CXR performed during syncope admission. It was felt her syncope was related to deconditioning. Her Cardizem was increased prior to discharge 2/2 increased rates with ambulation.   She comes into clinic today doing well. She feels like her strength is slowly returning. She does note some positional dizziness and has not been drinking as much water as she should. No further nose bleeds. BP at home has ranged from [id number] systolic. No chest pain or SOB. When talking with her about the syncopal episode that occurred on 10/6 it was discovered she had just stood from a sitting position leading to increased dizziness and passing out. No peri-syncope chest pain, SOB, palpitations, diaphoresis, nausea, or vomiting. She is now taking her time when standing. Her only concern today is regarding her weakness.    Past Medical History:  Diagnosis Date  . CKD (chronic kidney disease), stage III   . Epistaxis   . Gout   . HLD (hyperlipidemia)   . Hypertension   . Persistent atrial fibrillation (HCC)    a. not on full-dose anticoagulation 2/2 severe recurrent epistaxis; b. CHADS2VASc => 4 (HTN, age x 2, sex category)  . Syncope     Past Surgical History:  Procedure Laterality Date  . APPENDECTOMY    . TONSILLECTOMY    . TOTAL VAGINAL HYSTERECTOMY      Current Outpatient Prescriptions  Medication Sig Dispense Refill  .  cephALEXin (KEFLEX) 250 MG capsule Take 250 mg by mouth 2 (two) times daily.    Marland Kitchen diltiazem (CARDIZEM CD) 240 MG 24 hr capsule Take 1 capsule (240 mg total) by mouth daily. 60 capsule 1  . doxazosin (CARDURA) 8 MG tablet Take 1 tablet by mouth daily.    . hydrALAZINE (APRESOLINE) 25 MG tablet Take 1 tablet (25 mg total) by mouth every 8 (eight) hours. 90 tablet 1  .  HYDROcodone-acetaminophen (NORCO/VICODIN) 5-325 MG tablet Take 1 tablet by mouth every 6 (six) hours as needed for moderate pain. 15 tablet 0  . potassium chloride SA (K-DUR,KLOR-CON) 20 MEQ tablet Take 1 tablet (20 mEq total) by mouth 2 (two) times daily. 30 tablet 0  . senna-docusate (SENOKOT-S) 8.6-50 MG tablet Take 1 tablet by mouth at bedtime as needed for mild constipation. 20 tablet 0   No current facility-administered medications for this visit.     Allergies:   Codeine   Social History:  The patient  reports that she has quit smoking. She has never used smokeless tobacco. She reports that she does not drink alcohol or use drugs.   Family History:  The patient's family history includes Atrial fibrillation in her brother; Heart failure in her mother.  ROS:   Review of Systems  Constitutional: Positive for malaise/fatigue. Negative for chills, diaphoresis, fever and weight loss.  HENT: Negative for congestion and nosebleeds.   Eyes: Negative for discharge and redness.  Respiratory: Negative for cough, hemoptysis, sputum production, shortness of breath and wheezing.   Cardiovascular: Negative for chest pain, palpitations, orthopnea, claudication, leg swelling and PND.  Gastrointestinal: Negative for abdominal pain, blood in stool, heartburn, melena, nausea and vomiting.  Genitourinary: Negative for hematuria.  Musculoskeletal: Negative for falls and myalgias.  Skin: Negative for rash.  Neurological: Positive for dizziness and weakness. Negative for tingling, tremors, sensory change, speech change, focal weakness and loss of consciousness.  Endo/Heme/Allergies: Does not bruise/bleed easily.  Psychiatric/Behavioral: Negative for substance abuse. The patient is not nervous/anxious.   All other systems reviewed and are negative.    PHYSICAL EXAM:  VS:  BP 130/60 (BP Location: Left Arm, Patient Position: Sitting, Cuff Size: Normal)   Pulse 74   Ht 5\' 3"  (1.6 m)   Wt 143 lb 4 oz (65  kg)   BMI 25.38 kg/m  BMI: Body mass index is 25.38 kg/m.  Physical Exam  Constitutional: She is oriented to person, place, and time. She appears well-developed and well-nourished.  HENT:  Head: Normocephalic and atraumatic.  Eyes: Right eye exhibits no discharge. Left eye exhibits no discharge.  Neck: Normal range of motion. No JVD present.  Cardiovascular: Normal rate, S1 normal, S2 normal and normal heart sounds.  An irregularly irregular rhythm present. Exam reveals no distant heart sounds, no friction rub, no midsystolic click and no opening snap.   No murmur heard. Pulmonary/Chest: Effort normal and breath sounds normal. No respiratory distress. She has no decreased breath sounds. She has no wheezes. She has no rales. She exhibits no tenderness.  Abdominal: Soft. She exhibits no distension. There is no tenderness.  Musculoskeletal: She exhibits no edema.  Neurological: She is alert and oriented to person, place, and time.  Skin: Skin is warm and dry. No cyanosis. Nails show no clubbing.  Psychiatric: She has a normal mood and affect. Her speech is normal and behavior is normal. Judgment and thought content normal.     EKG:  Was ordered and interpreted by me today. Shows Afib,  74 bpm, nonspecific st/t changes  Recent Labs: 04/13/2016: TSH 1.711 04/15/2016: Magnesium 1.6 04/17/2016: BUN 21; Creatinine, Ser 1.30; Hemoglobin 10.6; Platelets 447; Potassium 3.7; Sodium 137  No results found for requested labs within last 8760 hours.   Estimated Creatinine Clearance: 32.8 mL/min (by C-G formula based on SCr of 1.3 mg/dL (H)).   Wt Readings from Last 3 Encounters:  04/21/16 143 lb 4 oz (65 kg)  04/16/16 136 lb 4.8 oz (61.8 kg)  04/12/16 145 lb 3.2 oz (65.9 kg)     Other studies reviewed: Additional studies/records reviewed today include: summarized above  Orthostatic Vital Signs: Lying: 158/64, 74 bpm Sitting: 146/64, 77 bpm Standing: 124/54, 90 bpm Standing x 3 min: 128/58,  84 bpm  ASSESSMENT AND PLAN:  1. Persistent Afib: Currently in Afib with a heart rate in the 70's bpm. I cannot rule out that some of her weakness is or is nto related to her Afib. Though suspect there is a large component related to her recent hospital admission that she still needs to recover from as well as her orthostatic hypotension. Has not been on full-dose anticoagulation given severe, recurrent epistaxis. CHADS2VASc at least 4 (HTN, age x 2, sex category). We have contacted the ENT, Dr. Kathyrn Sheriff, MD, that placed her packing in the ED to discuss anticoagulation. I suspect she will still nnot be a candidate for full-dose anticoagulation given her recent epistaxis. Without being able to place her on full-dose anticoagulation at this time we are unable to convert her back to sinus rhythm either electrically or via pharmaceuticals. Goal of rate control at this time. She and her daughter are aware of increased stroke risk while not being on anticoagulation.   2. Weakness/orthostatic hypotension: Likely multifactorial in the setting of her recent hospital admission as well as her orthostasis. Increase fluids. Consider PT, if this is an option for them moving forward. Check CBC.   3. History of syncope: Likely orthostatic. No orthostatic vitals signs for review from hospital admission. Increase fluids. Less likely post-termination pause given she remains in Afib. Afib by its self would not lead to syncope.   4. HTN: Well controlled. Continue current medications.  5. Epistaxis: Stable. Has not seen ENT as an outpatient, only in the ED for nasal packing.   6. Hypokalemia/CKD stage III: Check bmet.   7. Anemia: Check CBC. Needs follow up with PCP.   Disposition: F/u with me in 6 weeks.   Current medicines are reviewed at length with the patient today.  The patient did not have any concerns regarding medicines.  Paula Banker PA-C 04/21/2016 2:54 PM     [address] [place],  [id number] [phone]

## 2016-04-22 ENCOUNTER — Other Ambulatory Visit (INDEPENDENT_AMBULATORY_CARE_PROVIDER_SITE_OTHER): Payer: Commercial Managed Care - HMO | Admitting: *Deleted

## 2016-04-22 DIAGNOSIS — R040 Epistaxis: Secondary | ICD-10-CM

## 2016-04-22 DIAGNOSIS — I10 Essential (primary) hypertension: Secondary | ICD-10-CM

## 2016-04-22 DIAGNOSIS — I481 Persistent atrial fibrillation: Secondary | ICD-10-CM

## 2016-04-22 DIAGNOSIS — R0602 Shortness of breath: Secondary | ICD-10-CM

## 2016-04-22 DIAGNOSIS — I951 Orthostatic hypotension: Secondary | ICD-10-CM

## 2016-04-22 DIAGNOSIS — Z87898 Personal history of other specified conditions: Secondary | ICD-10-CM

## 2016-04-22 DIAGNOSIS — I4819 Other persistent atrial fibrillation: Secondary | ICD-10-CM

## 2016-04-22 DIAGNOSIS — E876 Hypokalemia: Secondary | ICD-10-CM

## 2016-04-22 NOTE — Telephone Encounter (Signed)
Spoke w/ Santiago Glad.  Advised her that pt is on Xarelto 15 mg once daily w/ supper.  She is appreciative and will call back w/ any questions or concerns.

## 2016-04-22 NOTE — Telephone Encounter (Signed)
Patient daughter wants to confirm directions for dosage on xarelto .

## 2016-04-23 DIAGNOSIS — R040 Epistaxis: Secondary | ICD-10-CM | POA: Diagnosis not present

## 2016-04-23 DIAGNOSIS — I10 Essential (primary) hypertension: Secondary | ICD-10-CM | POA: Diagnosis not present

## 2016-04-23 DIAGNOSIS — Z23 Encounter for immunization: Secondary | ICD-10-CM | POA: Diagnosis not present

## 2016-04-23 DIAGNOSIS — N760 Acute vaginitis: Secondary | ICD-10-CM | POA: Diagnosis not present

## 2016-04-23 DIAGNOSIS — F419 Anxiety disorder, unspecified: Secondary | ICD-10-CM | POA: Diagnosis not present

## 2016-04-23 DIAGNOSIS — I481 Persistent atrial fibrillation: Secondary | ICD-10-CM | POA: Diagnosis not present

## 2016-04-23 LAB — CBC WITH DIFFERENTIAL/PLATELET
BASOS ABS: 0.1 10*3/uL (ref 0.0–0.2)
Basos: 1 %
EOS (ABSOLUTE): 0.3 10*3/uL (ref 0.0–0.4)
EOS: 5 %
HEMOGLOBIN: 10.5 g/dL — AB (ref 11.1–15.9)
Hematocrit: 32.7 % — ABNORMAL LOW (ref 34.0–46.6)
IMMATURE GRANULOCYTES: 1 %
Immature Grans (Abs): 0.1 10*3/uL (ref 0.0–0.1)
LYMPHS ABS: 2.1 10*3/uL (ref 0.7–3.1)
Lymphs: 33 %
MCH: 29.2 pg (ref 26.6–33.0)
MCHC: 32.1 g/dL (ref 31.5–35.7)
MCV: 91 fL (ref 79–97)
MONOS ABS: 1 10*3/uL — AB (ref 0.1–0.9)
Monocytes: 16 %
NEUTROS PCT: 44 %
Neutrophils Absolute: 2.8 10*3/uL (ref 1.4–7.0)
PLATELETS: 520 10*3/uL — AB (ref 150–379)
RBC: 3.59 x10E6/uL — AB (ref 3.77–5.28)
RDW: 14.9 % (ref 12.3–15.4)
WBC: 6.3 10*3/uL (ref 3.4–10.8)

## 2016-04-23 LAB — BASIC METABOLIC PANEL
BUN / CREAT RATIO: 13 (ref 12–28)
BUN: 16 mg/dL (ref 8–27)
CO2: 23 mmol/L (ref 18–29)
CREATININE: 1.25 mg/dL — AB (ref 0.57–1.00)
Calcium: 9.9 mg/dL (ref 8.7–10.3)
Chloride: 100 mmol/L (ref 96–106)
GFR calc Af Amer: 48 mL/min/{1.73_m2} — ABNORMAL LOW (ref >59)
GFR calc non Af Amer: 42 mL/min/{1.73_m2} — ABNORMAL LOW (ref >59)
GLUCOSE: 127 mg/dL — AB (ref 65–99)
POTASSIUM: 5.3 mmol/L — AB (ref 3.5–5.2)
SODIUM: 139 mmol/L (ref 134–144)

## 2016-04-25 ENCOUNTER — Encounter: Payer: Self-pay | Admitting: Cardiology

## 2016-04-25 NOTE — Telephone Encounter (Signed)
This encounter was created in error - please disregard.

## 2016-04-26 ENCOUNTER — Ambulatory Visit
Admission: RE | Admit: 2016-04-26 | Discharge: 2016-04-26 | Disposition: A | Discharge status: Home / Self Care | Payer: Commercial Managed Care - HMO | Source: Ambulatory Visit | Attending: Student | Admitting: Student

## 2016-04-26 ENCOUNTER — Other Ambulatory Visit: Payer: Self-pay | Admitting: Student

## 2016-04-26 DIAGNOSIS — M25562 Pain in left knee: Secondary | ICD-10-CM

## 2016-04-26 DIAGNOSIS — M7122 Synovial cyst of popliteal space [Baker], left knee: Secondary | ICD-10-CM | POA: Diagnosis not present

## 2016-04-30 ENCOUNTER — Encounter
Admission: RE | Admit: 2016-04-30 | Discharge: 2016-04-30 | Disposition: A | Discharge status: Home / Self Care | Payer: Commercial Managed Care - HMO | Source: Ambulatory Visit | Attending: Physician Assistant | Admitting: Physician Assistant

## 2016-04-30 DIAGNOSIS — R0602 Shortness of breath: Secondary | ICD-10-CM | POA: Diagnosis not present

## 2016-04-30 DIAGNOSIS — I481 Persistent atrial fibrillation: Secondary | ICD-10-CM | POA: Insufficient documentation

## 2016-04-30 DIAGNOSIS — I4819 Other persistent atrial fibrillation: Secondary | ICD-10-CM

## 2016-04-30 MED ORDER — REGADENOSON 0.4 MG/5ML IV SOLN
0.4000 mg | Freq: Once | INTRAVENOUS | Status: AC
  Administered 2016-04-30: 0.4 mg via INTRAVENOUS
  Filled 2016-04-30: qty 5

## 2016-04-30 MED ORDER — TECHNETIUM TC 99M TETROFOSMIN IV KIT
13.0000 | PACK | Freq: Once | INTRAVENOUS | Status: AC | PRN
  Administered 2016-04-30: 12.501 via INTRAVENOUS

## 2016-04-30 MED ORDER — TECHNETIUM TC 99M TETROFOSMIN IV KIT
33.0000 | PACK | Freq: Once | INTRAVENOUS | Status: AC | PRN
  Administered 2016-04-30: 31.796 via INTRAVENOUS

## 2016-05-03 ENCOUNTER — Encounter: Payer: Self-pay | Admitting: Physician Assistant

## 2016-05-03 LAB — NM MYOCAR MULTI W/SPECT W/WALL MOTION / EF
CHL CUP NUCLEAR SDS: 4
CHL CUP NUCLEAR SRS: 11
CHL CUP NUCLEAR SSS: 15
CHL CUP RESTING HR STRESS: 80 {beats}/min
CHL CUP STRESS STAGE 1 SPEED: 0 mph
CHL CUP STRESS STAGE 3 SPEED: 0 mph
CHL CUP STRESS STAGE 4 GRADE: 0 %
CHL CUP STRESS STAGE 5 SPEED: 0 mph
CSEPEW: 1 METS
LV dias vol: 67 mL (ref 46–106)
LV sys vol: 28 mL
NUC STRESS TID: 1.11
Peak HR: 123 {beats}/min
Percent HR: 86 %
Percent of predicted max HR: 86 %
Stage 1 Grade: 0 %
Stage 1 HR: 87 {beats}/min
Stage 2 Grade: 0 %
Stage 2 HR: 86 {beats}/min
Stage 2 Speed: 0 mph
Stage 3 Grade: 0 %
Stage 3 HR: 123 {beats}/min
Stage 4 HR: 107 {beats}/min
Stage 4 Speed: 0 mph
Stage 5 DBP: 69 mmHg
Stage 5 Grade: 0 %
Stage 5 HR: 88 {beats}/min
Stage 5 SBP: 163 mmHg

## 2016-05-06 ENCOUNTER — Telehealth: Payer: Self-pay | Admitting: Cardiovascular Disease

## 2016-05-06 ENCOUNTER — Ambulatory Visit (INDEPENDENT_AMBULATORY_CARE_PROVIDER_SITE_OTHER): Payer: Commercial Managed Care - HMO | Admitting: Physician Assistant

## 2016-05-06 ENCOUNTER — Encounter: Payer: Self-pay | Admitting: Physician Assistant

## 2016-05-06 VITALS — BP 110/56 | HR 84 | Ht 63.0 in | Wt 144.5 lb

## 2016-05-06 DIAGNOSIS — I10 Essential (primary) hypertension: Secondary | ICD-10-CM

## 2016-05-06 DIAGNOSIS — I951 Orthostatic hypotension: Secondary | ICD-10-CM | POA: Diagnosis not present

## 2016-05-06 DIAGNOSIS — R040 Epistaxis: Secondary | ICD-10-CM | POA: Diagnosis not present

## 2016-05-06 DIAGNOSIS — N183 Chronic kidney disease, stage 3 unspecified: Secondary | ICD-10-CM

## 2016-05-06 DIAGNOSIS — I481 Persistent atrial fibrillation: Secondary | ICD-10-CM | POA: Diagnosis not present

## 2016-05-06 DIAGNOSIS — I4819 Other persistent atrial fibrillation: Secondary | ICD-10-CM

## 2016-05-06 NOTE — Patient Instructions (Addendum)
Medication Instructions:  Your physician recommends that you continue on your current medications as directed. Please refer to the Current Medication list given to you today.   Labwork: BMET and CBC  Testing/Procedures: Your physician has recommended that you have a Cardioversion (DCCV). Electrical Cardioversion uses a jolt of electricity to your heart either through paddles or wired patches attached to your chest. This is a controlled, usually prescheduled, procedure. Defibrillation is done under light anesthesia in the hospital, and you usually go home the day of the procedure. This is done to get your heart back into a normal rhythm. You are not awake for the procedure. Please see the instruction sheet given to you today.  You are scheduled for a Cardioversion on ________________ with Dr.___________ Please arrive at the [facility] at _________ a.m. on the day of your procedure.  DIET INSTRUCTIONS:  Nothing to eat or drink after midnight except your medications with a              sip of water.         1) Labs: BMET and CBC done today  2) Medications:  YOU MAY TAKE ALL of your remaining medications with a small amount of water.  3) Must have a responsible person to drive you home.  4) Bring a current list of your medications and current insurance cards.    If you have any questions after you get home, please call the office at [phone]   Follow-Up: Your physician recommends that you schedule a follow-up appointment with Dr. Rockey Situ after your cardioversion   Any Other Special Instructions Will Be Listed Below (If Applicable). Referral to pulmonology for sleep study     If you need a refill on your cardiac medications before your next appointment, please call your pharmacy.   Electrical Cardioversion Electrical cardioversion is the delivery of a jolt of electricity to change the rhythm of the heart. Sticky patches or metal paddles are placed on the chest to  deliver the electricity from a device. This is done to restore a normal rhythm. A rhythm that is too fast or not regular keeps the heart from pumping well. Electrical cardioversion is done in an emergency if:   There is low or no blood pressure as a result of the heart rhythm.   Normal rhythm must be restored as fast as possible to protect the brain and heart from further damage.   It may save a life. Cardioversion may be done for heart rhythms that are not immediately life threatening, such as atrial fibrillation or flutter, in which:   The heart is beating too fast or is not regular.   Medicine to change the rhythm has not worked.   It is safe to wait in order to allow time for preparation.  Symptoms of the abnormal rhythm are bothersome.  The risk of stroke and other serious problems can be reduced. LET [facility] CARE PROVIDER KNOW ABOUT:   Any allergies you have.  All medicines you are taking, including vitamins, herbs, eye drops, creams, and over-the-counter medicines.  Previous problems you or members of your family have had with the use of anesthetics.   Any blood disorders you have.   Previous surgeries you have had.   Medical conditions you have. RISKS AND COMPLICATIONS  Generally, this is a safe procedure. However, problems can occur and include:   Breathing problems related to the anesthetic used.  A blood clot that breaks free and travels to  other parts of your body. This could cause a stroke or other problems. The risk of this is lowered by use of blood-thinning medicine (anticoagulant) prior to the procedure.  Cardiac arrest (rare). BEFORE THE PROCEDURE   You may have tests to detect blood clots in your heart and to evaluate heart function.  You may start taking anticoagulants so your blood does not clot as easily.   Medicines may be given to help stabilize your heart rate and rhythm. PROCEDURE  You will be given medicine through an IV tube to  reduce discomfort and make you sleepy (sedative).   An electrical shock will be delivered. AFTER THE PROCEDURE Your heart rhythm will be watched to make sure it does not change.    This information is not intended to replace advice given to you by your health care provider. Make sure you discuss any questions you have with your health care provider.   Document Released: 06/18/2002 Document Revised: 07/19/2014 Document Reviewed: 01/10/2013 Elsevier Interactive Patient Education 2016 [employer]. Electrical Cardioversion, Care After Refer to this sheet in the next few weeks. These instructions provide you with information on caring for yourself after your procedure. Your health care provider may also give you more specific instructions. Your treatment has been planned according to current medical practices, but problems sometimes occur. Call your health care provider if you have any problems or questions after your procedure. WHAT TO EXPECT AFTER THE PROCEDURE After your procedure, it is typical to have the following sensations:  Some redness on the skin where the shocks were delivered. If this is tender, a sunburn lotion or hydrocortisone cream may help.  Possible return of an abnormal heart rhythm within hours or days after the procedure. HOME CARE INSTRUCTIONS  Take medicines only as directed by your health care provider. Be sure you understand how and when to take your medicine.  Learn how to feel your pulse and check it often.  Limit your activity for 48 hours after the procedure or as directed by your health care provider.  Avoid or minimize caffeine and other stimulants as directed by your health care provider. SEEK MEDICAL CARE IF:  You feel like your heart is beating too fast or your pulse is not regular.  You have any questions about your medicines.  You have bleeding that will not stop. SEEK IMMEDIATE MEDICAL CARE IF:  You are dizzy or feel faint.  It is hard to  breathe or you feel short of breath.  There is a change in discomfort in your chest.  Your speech is slurred or you have trouble moving an arm or leg on one side of your body.  You get a serious muscle cramp that does not go away.  Your fingers or toes turn cold or blue.   This information is not intended to replace advice given to you by your health care provider. Make sure you discuss any questions you have with your health care provider.   Document Released: 04/18/2013 Document Revised: 07/19/2014 Document Reviewed: 04/18/2013 Elsevier Interactive Patient Education [employer].

## 2016-05-06 NOTE — Progress Notes (Signed)
Cardiology Office Note Date:  05/06/2016  Patient ID:  Paula, Clark 1940-10-10, MRN [id number] PCP:  Marcello Fennel, MD  Cardiologist:  Dr. Rockey Situ, MD    Chief Complaint: Follow up for Afib  History of Present Illness: Paula Clark is a 75 y.o. female with history of recently diagnosed persistent Afib previously not on full-dose anticoagulation 2/2 severe recurrent epistaxis now on Eliquis per clearance with ENT as of 04/21/16, poorly controlled HTN, CKD stage III, HLD, and gout who presents for routine follow up of her Afib.   Prior to her admission to [facility] in early October 2017, she did not have any previously known cardiac history. She had been told she had an "irregular heart beat" dating back to her 27's. No prior EKG's on file for review. She initially presented to [facility] ED on 9/27 with epistaxis requiring nasal packing. She then experienced repeat epistaxis x 4 on 10/1 leading to repeat packing and discharge from the ED. Unfortunately, while walking to her car from her ED discharge on 10/1 she had yet another nose bleed. ENT repacked her nose and recommended hospital admission. BP during each ED visit was noted to be elevated ranging from the [id number] to [id number] systolic. She was noted to be in Afib on telemetry. EKG showed Afib, 80 bpm. TSH was normal. Magnesium 1.5, K+ 3.4, hgb 11.9. Troponin peaked at 0.03. Echo showed an EF of 60-65%, no RWMA, mild AI/MR, mildly dilated left atrium, PASP 32 mmHg. She was rate controlled with diltiazem and Toprol XL initially, though Toprol was held 2/2 bradycardia. She was not started on anticoagulaiton given her ongoing nasal bleed. There was consideration of possible Watchman device in the future if she could tolerate periprocedural anticoagulation and ASA/Plavix post procedure. She was discharged 10/5. She returned to [facility] on 10/6 a syncopal epsiode/weakness. Reportedly orthostatics were negative. Troponin peaked at 0.03. Hgb stable upon admission. EKG  showed rate-controlled Afib. No CXR performed during syncope admission. It was felt her syncope was related to deconditioning. Her Cardizem was increased prior to discharge 2/2 increased rates with ambulation. In hospital follow up on 10/11 she was doing well. She did note some positional dizziness and had not been drinking as much water as she should per her report. No further nose bleeds. BP was improved at home. When talking with her about the syncopal episode that occurred on 10/6 it was discovered she had just stood from a sitting position leading to increased dizziness and passing out. No peri-syncope chest pain, SOB, palpitations, diaphoresis, nausea, or vomiting. Orthostatic vital signs in our office were positive that day. We got in touch with Dr. Kathyrn Sheriff, the ENT physician that placed her nasal packing as above, who stated the patient was cleared for full-dose anticoagulation from their stand point. She was started on Xarelto 15 mg given her CrCl of < 69mL/min. She underwent planned outpatient nuclear stress testing on 10/23 that was normal, rhythm of Afib. At that time she reported no further epistaxis and was tolerating Eliquis without issues.   She is doing well today and has not issues or complaints. Fatigue continues to improve. Tolerating Xarelto without any bleeding issues from her nose, BRBPR, melena, hematemesis, or hematuria. Heart rate at home has ranged from the 60 bpm range to 80 bpm range. BP well controlled at home.    Past Medical History:  Diagnosis Date  . CKD (chronic kidney disease), stage III   . Epistaxis   . Gout   .  HLD (hyperlipidemia)   . Hypertension   . Persistent atrial fibrillation (HCC)    a. not on full-dose anticoagulation 2/2 severe recurrent epistaxis; b. CHADS2VASc => 4 (HTN, age x 2, sex category)  . Syncope     Past Surgical History:  Procedure Laterality Date  . APPENDECTOMY    . TONSILLECTOMY    . TOTAL VAGINAL HYSTERECTOMY      Current  Outpatient Prescriptions  Medication Sig Dispense Refill  . diltiazem (CARDIZEM CD) 240 MG 24 hr capsule Take 1 capsule (240 mg total) by mouth daily. 60 capsule 1  . doxazosin (CARDURA) 8 MG tablet Take 1 tablet by mouth daily.    . hydrALAZINE (APRESOLINE) 25 MG tablet Take 1 tablet (25 mg total) by mouth every 8 (eight) hours. 90 tablet 1  . rivaroxaban (XARELTO) 15 MG TABS tablet Take 1 tablet (15 mg total) by mouth daily with supper. 30 tablet 11   No current facility-administered medications for this visit.     Allergies:   Codeine   Social History:  The patient  reports that she has quit smoking. She has never used smokeless tobacco. She reports that she does not drink alcohol or use drugs.   Family History:  The patient's family history includes Atrial fibrillation in her brother; Heart failure in her mother.  ROS:   Review of Systems  Constitutional: Positive for malaise/fatigue. Negative for chills, diaphoresis, fever and weight loss.  HENT: Negative for congestion and nosebleeds.   Eyes: Negative for discharge and redness.  Respiratory: Negative for cough, hemoptysis, sputum production, shortness of breath and wheezing.   Cardiovascular: Negative for chest pain, palpitations, orthopnea, claudication, leg swelling and PND.  Gastrointestinal: Negative for abdominal pain, blood in stool, heartburn, melena, nausea and vomiting.  Genitourinary: Negative for hematuria.  Musculoskeletal: Negative for falls and myalgias.  Skin: Negative for rash.  Neurological: Negative for dizziness, tingling, tremors, sensory change, speech change, focal weakness, loss of consciousness and weakness.  Endo/Heme/Allergies: Does not bruise/bleed easily.  Psychiatric/Behavioral: Negative for substance abuse. The patient is not nervous/anxious.   All other systems reviewed and are negative.    PHYSICAL EXAM:  VS:  BP (!) 110/56 (BP Location: Left Arm, Patient Position: Sitting, Cuff Size: Normal)    Pulse 84   Ht 5\' 3"  (1.6 m)   Wt 144 lb 8 oz (65.5 kg)   BMI 25.60 kg/m  BMI: Body mass index is 25.6 kg/m.  Physical Exam  Constitutional: She is oriented to person, place, and time. She appears well-developed and well-nourished.  HENT:  Head: Normocephalic and atraumatic.  Eyes: Right eye exhibits no discharge. Left eye exhibits no discharge.  Neck: Normal range of motion. No JVD present.  Cardiovascular: Normal rate, S1 normal, S2 normal and normal heart sounds.  An irregularly irregular rhythm present. Exam reveals no distant heart sounds, no friction rub, no midsystolic click and no opening snap.   No murmur heard. Pulmonary/Chest: Effort normal and breath sounds normal. No respiratory distress. She has no decreased breath sounds. She has no wheezes. She has no rales. She exhibits no tenderness.  Abdominal: Soft. She exhibits no distension. There is no tenderness.  Musculoskeletal: She exhibits no edema.  Neurological: She is alert and oriented to person, place, and time.  Skin: Skin is warm and dry. No cyanosis. Nails show no clubbing.  Psychiatric: She has a normal mood and affect. Her speech is normal and behavior is normal. Judgment and thought content normal.  EKG:  Was ordered and interpreted by me today. Shows Afib, 84 bpm, nonspecific inferior st/t changes  Recent Labs: 04/13/2016: TSH 1.711 04/15/2016: Magnesium 1.6 04/17/2016: Hemoglobin 10.6 04/22/2016: BUN 16; Creatinine, Ser 1.25; Platelets 520; Potassium 5.3; Sodium 139  No results found for requested labs within last 8760 hours.   Estimated Creatinine Clearance: 34.3 mL/min (by C-G formula based on SCr of 1.25 mg/dL (H)).   Wt Readings from Last 3 Encounters:  05/06/16 144 lb 8 oz (65.5 kg)  04/21/16 143 lb 4 oz (65 kg)  04/16/16 136 lb 4.8 oz (61.8 kg)     Other studies reviewed: Additional studies/records reviewed today include: summarized above  ASSESSMENT AND PLAN:  1. Persistent Afib: Currently  in rate controlled Afib with heart rate in the 80's bpm. Continue Cardizem CD 240 md daily. Continue Xarelto 15 mg q dinner. Check bmet to assess up to date CrCl for Xarelto dosing. CHADS2VASc at least 4 (HTN, age x 2, sex category). Schedule DCCV with Dr. Rockey Situ first full week of November as she will have completed 3+ weeks of anticoagulation at that time. She has not missed any doses of Xarelto to date.   2. Orthostatic hypotension: Resolved. Continue current PO fluid intake.   3. HTN: Well controlled. Continue current medications.   4. Epistaxis: No further issues.   5. CKD stage III: Check bmet to assess for continued decrease dose of Xarelto given CrCl <50 mL/min with last bmet.   6. Hypokalemia: Resolved on 04/22/16 with potassium of 5.3 at that time. Check bmet today.   7. Anemia: Stable with CBC on 04/22/16.   Disposition: F/u with me s/p DCCV as above.   Current medicines are reviewed at length with the patient today.  The patient did not have any concerns regarding medicines.  Melvern Banker PA-C 05/06/2016 3:06 PM     [address], [place] [address] [phone]

## 2016-05-06 NOTE — Telephone Encounter (Signed)
DCCV scheduled Nov 7. Dr. Rockey Situ aware and agreeable. S/w pt daughter, Paula Clark, who understands to have pt arrive 6:30am, [facility]. She had no further questions at this time.

## 2016-05-07 LAB — BASIC METABOLIC PANEL
BUN / CREAT RATIO: 15 (ref 12–28)
BUN: 21 mg/dL (ref 8–27)
CO2: 25 mmol/L (ref 18–29)
CREATININE: 1.37 mg/dL — AB (ref 0.57–1.00)
Calcium: 9.6 mg/dL (ref 8.7–10.3)
Chloride: 99 mmol/L (ref 96–106)
GFR, EST AFRICAN AMERICAN: 43 mL/min/{1.73_m2} — AB (ref >59)
GFR, EST NON AFRICAN AMERICAN: 37 mL/min/{1.73_m2} — AB (ref >59)
Glucose: 121 mg/dL — ABNORMAL HIGH (ref 65–99)
Potassium: 3.9 mmol/L (ref 3.5–5.2)
Sodium: 140 mmol/L (ref 134–144)

## 2016-05-07 LAB — CBC
HEMOGLOBIN: 12 g/dL (ref 11.1–15.9)
Hematocrit: 36.2 % (ref 34.0–46.6)
MCH: 29.9 pg (ref 26.6–33.0)
MCHC: 33.1 g/dL (ref 31.5–35.7)
MCV: 90 fL (ref 79–97)
Platelets: 498 10*3/uL — ABNORMAL HIGH (ref 150–379)
RBC: 4.02 x10E6/uL (ref 3.77–5.28)
RDW: 14.8 % (ref 12.3–15.4)
WBC: 10.9 10*3/uL — ABNORMAL HIGH (ref 3.4–10.8)

## 2016-05-07 NOTE — Telephone Encounter (Signed)
Received consent ROI and $25 fee.  Faxed copy to [employer] and sent via interoffice mail.

## 2016-05-12 NOTE — Telephone Encounter (Signed)
Placed on Dr. Donivan Scull desk

## 2016-05-12 NOTE — Telephone Encounter (Signed)
Left message on cell VM for pt to contact the office. Pt son submitted FMLA paperwork.

## 2016-05-12 NOTE — Telephone Encounter (Signed)
Forms faxed from [employer] and highlighted for Gollan to Review.  Patient son needs these forms submitted to employer by Friday as he has had 2 extensions and they are going to terminate him without these forms.  Please let Anderson Malta or [place] at [employer] know when forms are ready.

## 2016-05-12 NOTE — Telephone Encounter (Signed)
S/w pt daughter, Santiago Glad, who states pt son, Thera Flake, helps provide care for pt. States he has taken time off work to help w/ADLs and transportation issues as Santiago Glad is disabled.  "I don't know what I would have done without him. I'm disabled and he helps." States pt has been sleeping more than usual. She has had a cough and was told by pharmacy to use saline spray. Cough has kept her up. Pt c/o back pain. Santiago Glad wakes pt at 5am to give her medications. Reviewed times for medications and encouraged daughter to have pt ambulate during the day as lying in bed could be contributing to back pain. She verbalized understanding and is agreeable w/plan. Daughter states she did not realized envelope on her kitchen table addressed to pt was actually [employer] paperwork. He needs this completed as he has had two extensions and his job is in jeopardy. Reviewed w/Dr. Rockey Situ who has paperwork and is aware of deadline.

## 2016-05-12 NOTE — Telephone Encounter (Signed)
Per Diamond Nickel at [employer] completed forms faxed to [facility].  Patient son  notified forms complete. Will mail at copy to patient son.

## 2016-05-13 ENCOUNTER — Telehealth: Payer: Self-pay | Admitting: Cardiovascular Disease

## 2016-05-13 NOTE — Telephone Encounter (Signed)
Received records request [employer], forwarded to [employer] for processing. ° °

## 2016-05-17 ENCOUNTER — Telehealth: Payer: Self-pay | Admitting: Physician Assistant

## 2016-05-17 ENCOUNTER — Other Ambulatory Visit: Payer: Self-pay | Admitting: Cardiovascular Disease

## 2016-05-17 ENCOUNTER — Ambulatory Visit: Payer: Commercial Managed Care - HMO | Admitting: Pulmonary Disease

## 2016-05-17 NOTE — Telephone Encounter (Signed)
Spoke w/ pt.  She verbalizes understanding to arrive @ the [facility] tomorrow @ 6:30 am for her DCCV.

## 2016-05-17 NOTE — Telephone Encounter (Signed)
Left message regarding tomorrow's DCCV on VM.

## 2016-05-18 ENCOUNTER — Ambulatory Visit: Payer: Commercial Managed Care - HMO | Admitting: Anesthesiology

## 2016-05-18 ENCOUNTER — Ambulatory Visit
Admission: RE | Admit: 2016-05-18 | Discharge: 2016-05-18 | Disposition: A | Discharge status: Home / Self Care | Payer: Commercial Managed Care - HMO | Source: Ambulatory Visit | Attending: Cardiovascular Disease | Admitting: Cardiovascular Disease

## 2016-05-18 ENCOUNTER — Encounter
Admission: RE | Disposition: A | Discharge status: Home / Self Care | Payer: Self-pay | Source: Ambulatory Visit | Attending: Cardiovascular Disease

## 2016-05-18 DIAGNOSIS — Z87891 Personal history of nicotine dependence: Secondary | ICD-10-CM | POA: Diagnosis not present

## 2016-05-18 DIAGNOSIS — I481 Persistent atrial fibrillation: Secondary | ICD-10-CM | POA: Insufficient documentation

## 2016-05-18 DIAGNOSIS — K219 Gastro-esophageal reflux disease without esophagitis: Secondary | ICD-10-CM | POA: Insufficient documentation

## 2016-05-18 DIAGNOSIS — I10 Essential (primary) hypertension: Secondary | ICD-10-CM | POA: Insufficient documentation

## 2016-05-18 DIAGNOSIS — R0602 Shortness of breath: Secondary | ICD-10-CM | POA: Diagnosis not present

## 2016-05-18 DIAGNOSIS — I4891 Unspecified atrial fibrillation: Secondary | ICD-10-CM | POA: Diagnosis not present

## 2016-05-18 HISTORY — PX: ELECTROPHYSIOLOGIC STUDY: SHX172A

## 2016-05-18 SURGERY — CARDIOVERSION (CATH LAB)
Anesthesia: General

## 2016-05-18 MED ORDER — SODIUM CHLORIDE 0.9 % IV SOLN
INTRAVENOUS | Status: DC
  Administered 2016-05-18: 07:00:00 via INTRAVENOUS

## 2016-05-18 MED ORDER — PROPOFOL 10 MG/ML IV BOLUS
INTRAVENOUS | Status: DC | PRN
  Administered 2016-05-18: 50 mg via INTRAVENOUS
  Administered 2016-05-18: 30 mg via INTRAVENOUS

## 2016-05-18 NOTE — CV Procedure (Signed)
Cardioversion procedure note For atrial fibrillation, persistent  Procedure Details:  Consent: Risks of procedure as well as the alternatives and risks of each were explained to the (patient/caregiver). Consent for procedure obtained.  Time Out: Verified patient identification, verified procedure, site/side was marked, verified correct patient position, special equipment/implants available, medications/allergies/relevent history reviewed, required imaging and test results available. Performed  Patient placed on cardiac monitor, pulse oximetry, supplemental oxygen as necessary.  Sedation given: propofol IV, Dr. Ronelle Nigh Pacer pads placed anterior and posterior chest.   Cardioverted 3 time(s).  Cardioverted at  150, 200J, (pads changed) 200J. Synchronized biphasic Did not convert to NSR, remained in atrial fib (no tele strip indicating any period of NSR)   Evaluation: Findings: Post procedure EKG shows: atrial fib Complications: None Patient did tolerate procedure well.  Long discussion with patient and family concerning various treatment options for her atrial fib. Either medical management, stay on anticoagulation Stay on current meds with anticoagulation and discuss watchman device with [place] team Or start amiodarone load, repeat cardioversion in several weeks  Given the suspect long chronicity of the atrial fib, Likely low suspicion of success with amiodarone and repeat cardioversion,  She has indicated she would be interested in learning more about the watchman device, Especially in light of her severe epistaxis requiring hospitalization, packing.  Request for watchman review has been sent to [facility] team.  Time Spent Directly with the Patient: 22 minutes   Esmond Plants, M.D., Ph.D.

## 2016-05-18 NOTE — Anesthesia Preprocedure Evaluation (Signed)
Anesthesia Evaluation  Patient identified by MRN, date of birth, ID band Patient awake    Reviewed: Allergy & Precautions, NPO status , Patient's Chart, lab work & pertinent test results  History of Anesthesia Complications Negative for: history of anesthetic complications  Airway Mallampati: II       Dental  (+) Loose,    Pulmonary neg pulmonary ROS, former smoker,           Cardiovascular hypertension, Pt. on medications + dysrhythmias Atrial Fibrillation      Neuro/Psych negative neurological ROS     GI/Hepatic Neg liver ROS, GERD  Medicated,  Endo/Other    Renal/GU Renal InsufficiencyRenal disease     Musculoskeletal   Abdominal   Peds  Hematology  (+) anemia ,   Anesthesia Other Findings   Reproductive/Obstetrics                             Anesthesia Physical Anesthesia Plan  ASA: III  Anesthesia Plan: General   Post-op Pain Management:    Induction: Intravenous  Airway Management Planned:   Additional Equipment:   Intra-op Plan:   Post-operative Plan:   Informed Consent: I have reviewed the patients History and Physical, chart, labs and discussed the procedure including the risks, benefits and alternatives for the proposed anesthesia with the patient or authorized representative who has indicated his/her understanding and acceptance.     Plan Discussed with:   Anesthesia Plan Comments:         Anesthesia Quick Evaluation

## 2016-05-18 NOTE — Transfer of Care (Signed)
Immediate Anesthesia Transfer of Care Note  Patient: Paula Clark  Procedure(s) Performed: Procedure(s): CARDIOVERSION (N/A)  Patient Location: PACU  Anesthesia Type:General  Level of Consciousness: awake and alert   Airway & Oxygen Therapy: Patient Spontanous Breathing and Patient connected to nasal cannula oxygen  Post-op Assessment: Report given to RN and Post -op Vital signs reviewed and stable  Post vital signs: Reviewed and stable  Last Vitals:  Vitals:   05/18/16 0800 05/18/16 0815  BP: (!) 133/99 (!) 143/70  Pulse: 74 63  Resp: (!) 21 19  Temp:      Last Pain:  Vitals:   05/18/16 0648  TempSrc: Oral         Complications: No apparent anesthesia complications

## 2016-05-18 NOTE — Anesthesia Postprocedure Evaluation (Signed)
Anesthesia Post Note  Patient: Paula Clark  Procedure(s) Performed: Procedure(s) (LRB): CARDIOVERSION (N/A)  Patient location during evaluation: Other Anesthesia Type: General Level of consciousness: awake and alert Pain management: pain level controlled Vital Signs Assessment: post-procedure vital signs reviewed and stable Respiratory status: spontaneous breathing and respiratory function stable Cardiovascular status: stable Anesthetic complications: no    Last Vitals:  Vitals:   05/18/16 0751 05/18/16 0752  BP: 132/64   Pulse: 75 74  Resp: (!) 23 (!) 23  Temp:      Last Pain:  Vitals:   05/18/16 0648  TempSrc: Oral                 Celester Lech K

## 2016-05-18 NOTE — Discharge Instructions (Signed)
Electrical Cardioversion, Care After °Refer to this sheet in the next few weeks. These instructions provide you with information on caring for yourself after your procedure. Your health care provider may also give you more specific instructions. Your treatment has been planned according to current medical practices, but problems sometimes occur. Call your health care provider if you have any problems or questions after your procedure. °WHAT TO EXPECT AFTER THE PROCEDURE °After your procedure, it is typical to have the following sensations: °· Some redness on the skin where the shocks were delivered. If this is tender, a sunburn lotion or hydrocortisone cream may help. °· Possible return of an abnormal heart rhythm within hours or days after the procedure. °HOME CARE INSTRUCTIONS °· Take medicines only as directed by your health care provider. Be sure you understand how and when to take your medicine. °· Learn how to feel your pulse and check it often. °· Limit your activity for 48 hours after the procedure or as directed by your health care provider. °· Avoid or minimize caffeine and other stimulants as directed by your health care provider. °SEEK MEDICAL CARE IF: °· You feel like your heart is beating too fast or your pulse is not regular. °· You have any questions about your medicines. °· You have bleeding that will not stop. °SEEK IMMEDIATE MEDICAL CARE IF: °· You are dizzy or feel faint. °· It is hard to breathe or you feel short of breath. °· There is a change in discomfort in your chest. °· Your speech is slurred or you have trouble moving an arm or leg on one side of your body. °· You get a serious muscle cramp that does not go away. °· Your fingers or toes turn cold or blue. °  °This information is not intended to replace advice given to you by your health care provider. Make sure you discuss any questions you have with your health care provider. °  °Document Released: 04/18/2013 Document Revised: 07/19/2014  Document Reviewed: 04/18/2013 °Elsevier Interactive Patient Education ©2016 Elsevier Inc. ° °

## 2016-05-18 NOTE — Anesthesia Postprocedure Evaluation (Signed)
Anesthesia Post Note  Patient: Paula Clark  Procedure(s) Performed: Procedure(s) (LRB): CARDIOVERSION (N/A)  Patient location during evaluation: Cath Lab Anesthesia Type: General Level of consciousness: awake and oriented Pain management: pain level controlled Vital Signs Assessment: post-procedure vital signs reviewed and stable Respiratory status: spontaneous breathing, nonlabored ventilation and respiratory function stable Cardiovascular status: blood pressure returned to baseline and stable Anesthetic complications: no    Last Vitals:  Vitals:   05/18/16 0751 05/18/16 0752  BP: 132/64   Pulse: 75 74  Resp: (!) 23 (!) 23  Temp:      Last Pain:  Vitals:   05/18/16 0648  TempSrc: Oral                 [name]

## 2016-05-18 NOTE — Progress Notes (Signed)
Pt clinically stable post procedure, family present, still in atrial fib, Dr Rockey Situ speaking at length with patient and family, questions answered, to contact [place] for possible watchman procedure. Already has followup with Dr Rockey Situ next week.

## 2016-05-18 NOTE — Anesthesia Procedure Notes (Signed)
Date/Time: 05/18/2016 7:43 AM Performed by: [name] Pre-anesthesia Checklist: Patient identified, Emergency Drugs available, Suction available, Patient being monitored and Timeout performed Patient Re-evaluated:Patient Re-evaluated prior to induction

## 2016-05-19 ENCOUNTER — Encounter: Payer: Self-pay | Admitting: Cardiovascular Disease

## 2016-05-21 ENCOUNTER — Telehealth: Payer: Self-pay | Admitting: Cardiovascular Disease

## 2016-05-21 NOTE — Telephone Encounter (Signed)
Pt daughter calling stating pt is having some bleeding She blew her nose and noticed some blood. And also when she coughs She states pt didn't look so well this morning She looked a bit swollen She suggested to patient they needed her to go Urgent care PCP told her to try a saline but it seems to be getting worst. Has not called the pcp today   Please advise

## 2016-05-21 NOTE — Telephone Encounter (Signed)
Pt daughter has a question regarding the time of day she should give pt her medications. Please call and advise.

## 2016-05-21 NOTE — Telephone Encounter (Signed)
Spoke w/ Santiago Glad.  She reports that pt takes some of her meds @ 8 am and others @ 9 am. She would like to know if she can give her meds together @ 8 am. Reviewed her meds and advised her that they can be taken together, but if she does not feel comfortable doing this, she can spread her meds out during the day or give half at night and half in the am. She reports ankle swelling; advised her to have pt limit her sodium intake and fluids.  She reports that pt drinks a lot of Sprite, does not like water.  She is appreciative and will keep her appt on Tuesday w/ Dr. Rockey Situ.

## 2016-05-21 NOTE — Telephone Encounter (Signed)
I discussed this w/ Santiago Glad earlier today and advised her to call pt's PCP. She reported that pt has sinus issues and was advised by PCP & pharmacist to try saline for her nose. She also stated that pt has a cough  - she had a family member who took a "heart med that made him cough".  Perhaps she called the wrong office.

## 2016-05-22 ENCOUNTER — Telehealth: Payer: Self-pay | Admitting: Nurse Practitioner

## 2016-05-22 NOTE — Telephone Encounter (Signed)
   Pts dtr called that BP was 175 this am.  She had just taken her am meds.  Pts dtr is nervous b/c pt has recently had nose bleeds and takes xarelto.  I rec that she repeat her blood pressure when we hang up since it's been over an hour since her AM meds.  If systolic is > 0000000, give additional hydralazine 25 mg this AM.    Caller verbalized understanding and was grateful for the call back.  Murray Hodgkins, NP 05/22/2016, 9:44 AM

## 2016-05-24 ENCOUNTER — Telehealth: Payer: Self-pay | Admitting: Cardiovascular Disease

## 2016-05-24 NOTE — Telephone Encounter (Signed)
Forms from [employer] returned for ammendment.  Please return asap as patient son needs these to keep job.  Please in Nurse box only date needs to be added in section 12.

## 2016-05-24 NOTE — Telephone Encounter (Signed)
Gollan completed forms .  Called Neka at [employer] and sent via fax.  LMOV to let Thera Flake (son) know this was done.

## 2016-05-25 ENCOUNTER — Ambulatory Visit (INDEPENDENT_AMBULATORY_CARE_PROVIDER_SITE_OTHER): Payer: Commercial Managed Care - HMO | Admitting: Cardiovascular Disease

## 2016-05-25 ENCOUNTER — Encounter: Payer: Self-pay | Admitting: Cardiovascular Disease

## 2016-05-25 VITALS — BP 126/54 | HR 78 | Ht 63.0 in | Wt 147.5 lb

## 2016-05-25 DIAGNOSIS — I10 Essential (primary) hypertension: Secondary | ICD-10-CM

## 2016-05-25 DIAGNOSIS — I481 Persistent atrial fibrillation: Secondary | ICD-10-CM

## 2016-05-25 DIAGNOSIS — Z7189 Other specified counseling: Secondary | ICD-10-CM

## 2016-05-25 DIAGNOSIS — R040 Epistaxis: Secondary | ICD-10-CM

## 2016-05-25 DIAGNOSIS — I4819 Other persistent atrial fibrillation: Secondary | ICD-10-CM

## 2016-05-25 MED ORDER — HYDRALAZINE HCL 50 MG PO TABS: 50.0000 mg | ORAL_TABLET | Freq: Three times a day (TID) | ORAL | 11 refills | Status: DC

## 2016-05-25 NOTE — Progress Notes (Signed)
Cardiology Office Note  Date:  05/25/2016   ID:  Paula Clark, DOB 1941-02-26, MRN [id number]  PCP:  Marcello Fennel, MD   Chief Complaint  Patient presents with  . other    Follow up from Cardioversion. Meds reviewed by the pt. verbally. "doing well."     HPI:  75 year old woman With history of hypertension, persistent atrial fibrillation, syncope in the setting of dehydration, presents to establish care in the [place] office after recent hospital discharge. Attempted cardioversion for atrial fibrillation 05/18/2016  History of severe epistaxis right nostril 04/07/2016 Treated with nasal balloon, Augmentin, seen by ear nose throat, had packing Seen again in the emergency room 04/11/2016 with epistaxis x4, last episode did not stop Packing fell out in the ER pt denies auto-trauma, or blowing her nose. Rhino Rocket placed, with Augmentin Noted to be in atrial fibrillation in the emergency room, no prior EKGs available but patient responded at that time that she had atrial fibrillation before but was not on anticoagulation --For continued bleeding was seen by ENT in the emergency room Admitted to the hospital 04/12/2016 Cardiology consult  04/13/2016,  blood noted to be trip in from her nasal packing, also had nasal clip -She was started on diltiazem 120 mg daily, hydralazine for blood pressure Discharged 04/15/2016 Admitted back to the hospital 04/16/2016 with fall, weakness, dehydration, creatinine 1.6 (improved with IV hydration overnight), discharge on 04/18/2016  She was seen by our office, ryan Dunn 04/21/2016, scheduled for cardioversion Cardioversion attempted 05/18/2016 Despite several attempts, this was unsuccessful. Was unable to restore normal sinus rhythm  for any length of time . Based on this finding, suspect she has had atrial fibrillation for some time, possibly chronic  In follow-up today, she reports that she feels well  Blood pressure initially was low after  discharge from the hospital, starting to run high in the past week. Systolic pressures up to [id number]  Watery bleed from her nose on Saturday, 3 days ago Treated with saline, got a little better Happened again in the PM, mild blood Went away on its own   Echo showed preserved LV function with mild LVH and mild valvular disease.   CHADSVASc score of at least 4.  EKG on today's visit shows atrial fibrillation with ventricular rate 78 bpm  PMH:   has a past medical history of CKD (chronic kidney disease), stage III; Dysrhythmia; Epistaxis; Gout; HLD (hyperlipidemia); Hypertension; Persistent atrial fibrillation ([place]); and Syncope.  PSH:    Past Surgical History:  Procedure Laterality Date  . APPENDECTOMY    . ELECTROPHYSIOLOGIC STUDY N/A 05/18/2016   Procedure: CARDIOVERSION;  Surgeon: Minna Merritts, MD;  Location: [facility] ORS;  Service: Cardiovascular;  Laterality: N/A;  . TONSILLECTOMY    . TOTAL VAGINAL HYSTERECTOMY      Current Outpatient Prescriptions  Medication Sig Dispense Refill  . acetaminophen (TYLENOL) 500 MG tablet Take 500-1,000 mg by mouth every 6 (six) hours as needed for moderate pain or headache.    . cetirizine (ZYRTEC) 10 MG tablet Take 10 mg by mouth daily.    . Cholecalciferol (VITAMIN D3) 1000 units CAPS Take 1,000 Units by mouth daily.    Marland Kitchen diltiazem (CARDIZEM CD) 240 MG 24 hr capsule Take 1 capsule (240 mg total) by mouth daily. 60 capsule 1  . doxazosin (CARDURA) 8 MG tablet Take 8 mg by mouth daily.     . hydrALAZINE (APRESOLINE) 50 MG tablet Take 1 tablet (50 mg total) by mouth every 8 (eight)  hours. 90 tablet 11  . ranitidine (ZANTAC) 150 MG tablet Take 150 mg by mouth daily as needed for heartburn.    . rivaroxaban (XARELTO) 15 MG TABS tablet Take 1 tablet (15 mg total) by mouth daily with supper. 30 tablet 11  . sodium chloride (OCEAN) 0.65 % SOLN nasal spray Place 1 spray into both nostrils as needed for congestion.     No current facility-administered  medications for this visit.      Allergies:   Codeine   Social History:  The patient  reports that she has quit smoking. She has never used smokeless tobacco. She reports that she does not drink alcohol or use drugs.   Family History:   family history includes Atrial fibrillation in her brother; Heart failure in her mother.    Review of Systems: Review of Systems  Constitutional: Negative.   HENT:       Nosebleeds  Respiratory: Negative.   Cardiovascular: Positive for palpitations.  Gastrointestinal: Negative.   Musculoskeletal: Negative.   Neurological: Negative.   Psychiatric/Behavioral: Negative.   All other systems reviewed and are negative.    PHYSICAL EXAM: VS:  BP (!) 126/54 (BP Location: Left Arm, Patient Position: Sitting, Cuff Size: Normal)   Pulse 78   Ht 5\' 3"  (1.6 m)   Wt 147 lb 8 oz (66.9 kg)   BMI 26.13 kg/m  , BMI Body mass index is 26.13 kg/m. GEN: Well nourished, well developed, in no acute distress  HEENT: normal  Neck: no JVD, carotid bruits, or masses Cardiac:Irregular rate and rhythm,no murmurs, rubs, or gallops,no edema  Respiratory:  clear to auscultation bilaterally, normal work of breathing GI: soft, nontender, nondistended, + BS MS: no deformity or atrophy  Skin: warm and dry, no rash Neuro:  Strength and sensation are intact Psych: euthymic mood, full affect    Recent Labs: 04/13/2016: TSH 1.711 04/15/2016: Magnesium 1.6 04/17/2016: Hemoglobin 10.6 05/06/2016: BUN 21; Creatinine, Ser 1.37; Platelets 498; Potassium 3.9; Sodium 140    Lipid Panel No results found for: CHOL, HDL, LDLCALC, TRIG    Wt Readings from Last 3 Encounters:  05/25/16 147 lb 8 oz (66.9 kg)  05/18/16 144 lb (65.3 kg)  05/06/16 144 lb 8 oz (65.5 kg)       ASSESSMENT AND PLAN:  Persistent atrial fibrillation (HCC) - Plan: EKG 12-Lead No prior EKGs available prior to recent hospital admissions Suspect chronic atrial fibrillation Unsuccessful cardioversion  attempt last week (despite aggressive efforts did not even manifest short period of sinus rhythm following each attempt) She is tolerating xarelto 15 mg daily (CrCl of 35.46 mL/min on the labs drawn on 10/26 continue reduced dose Xarelto 15 mg q dinner) CHADS VASC 4 Continues to have nosebleeds this past weekend, though very light blood Suspect she will be high risk of nosebleeds in the future. Scheduled to see Dr. Burt Knack for discussion of Watchman candidacy later this week. Started preliminary discussions with her today concerning the device  Essential hypertension - Plan: EKG 12-Lead Blood pressure running high at home for the past week Recommended she increase hydralazine up to 50 mg 3 times a day Stay on her other medications including diltiazem and Cardura  Epistaxis Difficult time in the past several months requiring packing, clipping, recurrent symptoms. So far tolerating xarelto but having mild blood dripping from her nose this past weekend.  Encounter for anticoagulation discussion and counseling She will continue on the xarelto 15 mg daily Close monitoring of renal function, creatinine clearance  Total encounter time more than 25 minutes  Greater than 50% was spent in counseling and coordination of care with the patient   Disposition:   F/U  6 months   Orders Placed This Encounter  Procedures  . EKG 12-Lead     Signed, Esmond Plants, M.D., Ph.D. 05/25/2016  [place], [place]

## 2016-05-25 NOTE — Patient Instructions (Addendum)
Medication Instructions:   Please increase the hydralazine up to 50 mg three times a day  Labwork:  No new labs needed  Testing/Procedures:   No further testing at this time   I recommend watching educational videos on topics of interest to you at:       [email or web address]  Enter code: [email or web address]    Follow-Up: It was a pleasure seeing you in the office today. Please call us if you have new issues that need to be addressed before your next appt.  [phone]  Your physician wants you to follow-up in: 6 months.  You will receive a reminder letter in the mail two months in advance. If you don't receive a letter, please call our office to schedule the follow-up appointment.  If you need a refill on your cardiac medications before your next appointment, please call your pharmacy.

## 2016-05-27 ENCOUNTER — Ambulatory Visit (INDEPENDENT_AMBULATORY_CARE_PROVIDER_SITE_OTHER): Payer: Commercial Managed Care - HMO | Admitting: Cardiovascular Disease

## 2016-05-27 ENCOUNTER — Encounter: Payer: Self-pay | Admitting: Cardiovascular Disease

## 2016-05-27 VITALS — BP 116/60 | HR 88 | Ht 63.0 in | Wt 146.1 lb

## 2016-05-27 DIAGNOSIS — I481 Persistent atrial fibrillation: Secondary | ICD-10-CM | POA: Diagnosis not present

## 2016-05-27 DIAGNOSIS — I4819 Other persistent atrial fibrillation: Secondary | ICD-10-CM

## 2016-05-27 NOTE — Patient Instructions (Signed)
Medication Instructions:  Your physician recommends that you continue on your current medications as directed. Please refer to the Current Medication list given to you today.  Labwork: No new orders.  Testing/Procedures: No new orders.   Follow-Up: Dr Burt Knack will contact your ENT and we will contact you by phone to determine if you would like to proceed with Watchman evaluation.    Any Other Special Instructions Will Be Listed Below (If Applicable).     If you need a refill on your cardiac medications before your next appointment, please call your pharmacy.

## 2016-05-27 NOTE — Progress Notes (Addendum)
Cardiology Office Note Date:  05/28/2016   ID:  Paula Clark, DOB 1940-11-19, MRN [id number]  PCP:  Marcello Fennel, MD  Cardiologist:  Sherren Mocha, MD    Chief Complaint  Patient presents with  . Atrial Fibrillation   History of Present Illness: Paula Clark is a 75 y.o. female who presents for Watchman evaluation.   The patient has been followed by Dr Rockey Situ and Christell Faith, PA-C. She has recently been diagnosed with atrial fibrillation, but longstanding AF is suspected.   She has had a lot of problems with epistaxis. Had 3 separate episodes requiring packing. This occurred in September 2017 and was very difficult for her. Treated with nasal packing. Episodes occurred prior to initiation of oral anticoagulation. She was taking ASA 81 mg when this happened.   She later was started on Xarelto after clearing with ENT and underwent cardioversion but this was unsuccessful. She is now managed with rate control/anticoagulation strategy but not felt to be a good candidate for long-term anticoagulation with concerns over bleeding risk from epistaxis.   The patient is here with her daughter today. She complains of fatigue but no specific cardiac complaints. Today, she denies symptoms of palpitations, chest pain, shortness of breath, orthopnea, PND, lower extremity edema, dizziness, or syncope.   Past Medical History:  Diagnosis Date  . CKD (chronic kidney disease), stage III   . Dysrhythmia    afib  . Epistaxis   . Gout   . HLD (hyperlipidemia)   . Hypertension   . Persistent atrial fibrillation (HCC)    a. not on full-dose anticoagulation 2/2 severe recurrent epistaxis; b. CHADS2VASc => 4 (HTN, age x 2, sex category)  . Syncope     Past Surgical History:  Procedure Laterality Date  . APPENDECTOMY    . ELECTROPHYSIOLOGIC STUDY N/A 05/18/2016   Procedure: CARDIOVERSION;  Surgeon: Minna Merritts, MD;  Location: [facility] ORS;  Service: Cardiovascular;  Laterality: N/A;  .  TONSILLECTOMY    . TOTAL VAGINAL HYSTERECTOMY      Current Outpatient Prescriptions  Medication Sig Dispense Refill  . acetaminophen (TYLENOL) 500 MG tablet Take 500-1,000 mg by mouth every 6 (six) hours as needed for moderate pain or headache.    . cetirizine (ZYRTEC) 10 MG tablet Take 10 mg by mouth daily.    . Cholecalciferol (VITAMIN D3) 1000 units CAPS Take 1,000 Units by mouth daily.    Marland Kitchen diltiazem (CARDIZEM CD) 240 MG 24 hr capsule Take 1 capsule (240 mg total) by mouth daily. 60 capsule 1  . doxazosin (CARDURA) 8 MG tablet Take 8 mg by mouth daily.     . hydrALAZINE (APRESOLINE) 50 MG tablet Take 1 tablet (50 mg total) by mouth every 8 (eight) hours. 90 tablet 11  . ranitidine (ZANTAC) 150 MG tablet Take 150 mg by mouth daily as needed for heartburn.    . rivaroxaban (XARELTO) 15 MG TABS tablet Take 1 tablet (15 mg total) by mouth daily with supper. 30 tablet 11  . sodium chloride (OCEAN) 0.65 % SOLN nasal spray Place 1 spray into both nostrils as needed for congestion.     No current facility-administered medications for this visit.     Allergies:   Codeine   Social History:  The patient  reports that she has quit smoking. She has never used smokeless tobacco. She reports that she does not drink alcohol or use drugs.   Family History:  The patient's  family history includes Atrial fibrillation  in her brother; Heart failure in her mother.    ROS:  Please see the history of present illness.  Otherwise, review of systems is positive for hearing loss, cough, back pain, snoring, joint swelling, excessive sweating.  All other systems are reviewed and negative.    PHYSICAL EXAM: VS:  BP 116/60 (BP Location: Right Arm, Patient Position: Sitting, Cuff Size: Normal)   Pulse 88   Ht 5\' 3"  (1.6 m)   Wt 146 lb 1.9 oz (66.3 kg)   SpO2 95%   BMI 25.88 kg/m  , BMI Body mass index is 25.88 kg/m. GEN: Well nourished, well developed, in no acute distress  HEENT: normal  Neck: no JVD, no  masses. No carotid bruits Cardiac: irregularly irregular without murmur or gallop                Respiratory:  clear to auscultation bilaterally, normal work of breathing GI: soft, nontender, nondistended, + BS MS: no deformity or atrophy  Ext: no pretibial edema, pedal pulses 2+= bilaterally Skin: warm and dry, no rash Neuro:  Strength and sensation are intact Psych: euthymic mood, full affect  EKG:  EKG is not ordered today.  Recent Labs: 04/13/2016: TSH 1.711 04/15/2016: Magnesium 1.6 04/17/2016: Hemoglobin 10.6 05/06/2016: BUN 21; Creatinine, Ser 1.37; Platelets 498; Potassium 3.9; Sodium 140   Lipid Panel  No results found for: CHOL, TRIG, HDL, CHOLHDL, VLDL, LDLCALC, LDLDIRECT    Wt Readings from Last 3 Encounters:  05/27/16 146 lb 1.9 oz (66.3 kg)  05/25/16 147 lb 8 oz (66.9 kg)  05/18/16 144 lb (65.3 kg)     Cardiac Studies Reviewed: Echo 04-13-2016: Left ventricle:  The cavity size was normal. Wall thickness was increased in a pattern of mild LVH. Systolic function was normal. The estimated ejection fraction was in the range of 60% to 65%. Wall motion was normal; there were no regional wall motion abnormalities. The study was not technically sufficient to allow evaluation of LV diastolic dysfunction due to atrial fibrillation.   ------------------------------------------------------------------- Aortic valve:  Poorly visualized.  Doppler:   There was no stenosis.   There was mild regurgitation.  ------------------------------------------------------------------- Aorta:  Aortic root: The aortic root was normal in size.  ------------------------------------------------------------------- Mitral valve:   Structurally normal valve.   Leaflet separation was normal.  Doppler:  Transvalvular velocity was within the normal range. There was no evidence for stenosis. There was mild regurgitation.    Peak gradient (D): 4 mm  Hg.  ------------------------------------------------------------------- Left atrium:  The atrium was mildly dilated.  ------------------------------------------------------------------- Right ventricle:  The cavity size was normal. Wall thickness was normal. Systolic function was normal.  ------------------------------------------------------------------- Pulmonic valve:   Poorly visualized.  Doppler:  Transvalvular velocity was within the normal range. There was no significant regurgitation.  ------------------------------------------------------------------- Tricuspid valve:  Poorly visualized.  Doppler:  There was trivial regurgitation.  ------------------------------------------------------------------- Pulmonary artery:   Poorly visualized.  ------------------------------------------------------------------- Right atrium:  The atrium was mildly dilated.  ------------------------------------------------------------------- Pericardium:  A trivial pericardial effusion was identified posterior to the heart.   ASSESSMENT AND PLAN: I have seen MIRNA DEFREITAS is a 75 y.o. female in the office today who has been referred by Dr Rockey Situ for a Watchman left atrial appendage closure device.  She has a history of persistent atrial fibrillation.  This patients CHA2DS2-VASc Score and unadjusted Ischemic Stroke Rate (% per year) is equal to 4.8 % stroke rate/year from a score of 4 which necessitates long term oral anticoagulation to prevent stroke.  HasBled score is 4. Unfortunately, She is not felt to be a long term Warfarin candidate secondary to severe epistaxis.  The patients chart has been reviewed and I along with their referring cardiologist feel that they would be a candidate for short term oral anticoagulation.  Procedural risks for the Watchman implant have been reviewed with the patient including a 1% risk of stroke, 2% risk of perforation, 0.1% risk of device embolization.  Given  the patient's poor candidacy for long-term oral anticoagulation, ability to tolerate short term oral anticoagulation, I have recommended the watchman left atrial appendage closure system.  TEE will be scheduled to review LAA anatomy.  The patient understands that the ability to implant Watchman is dependent on results of the TEE.  If patient is candidate for Watchman based on TEE results, we will schedule the procedure at the next available time.   I will discuss the patient's case with her ENT physician prior to scheduling TEE to make sure she will be a candidate for 6 months of dual antiplatelet Rx. I have some concern that her epistaxis occurred on a background of low-dose ASA and she will need to take ASA and plavix together following Watchman implantation.  Will contact her to schedule TEE after this discussion.  Current medicines are reviewed with the patient today.  The patient does not have concerns regarding medicines.  Labs/ tests ordered today include:  No orders of the defined types were placed in this encounter.   Deatra James, MD  05/28/2016 11:31 PM    [address], [place], [place]  [address] Phone: [phone]; Fax: [phone]   ADDENDUM 07-06-2016: Reviewed chart. Pt had mild nose bleeding just when blowing her nose associated with sinus congestion. This has resolved. She continues on Xarelto and seems to be tolerating it well. I have reviewed her case further and think it's best to keep her on Xarelto stroke prevention in the context of persistent atrial fibrillation. She had severe epistaxis on ASA alone and would require 6 months of DAPT with ASA and plavix if we pursue Watchman. She will notify me if any changes in her clinical status.   Sherren Mocha 07/06/2016 4:57 PM

## 2016-06-02 ENCOUNTER — Ambulatory Visit: Payer: Self-pay | Admitting: Physician Assistant

## 2016-06-29 DIAGNOSIS — I481 Persistent atrial fibrillation: Secondary | ICD-10-CM | POA: Diagnosis not present

## 2016-06-29 DIAGNOSIS — J0100 Acute maxillary sinusitis, unspecified: Secondary | ICD-10-CM | POA: Diagnosis not present

## 2016-06-29 DIAGNOSIS — Z79899 Other long term (current) drug therapy: Secondary | ICD-10-CM | POA: Diagnosis not present

## 2016-07-02 ENCOUNTER — Telehealth: Payer: Self-pay | Admitting: Cardiovascular Disease

## 2016-07-02 NOTE — Telephone Encounter (Signed)
Called patient back. Gave her the advice to continue Xarelto and if nose started to bleed constantly to go to the ER. She verbalized understanding.

## 2016-07-02 NOTE — Telephone Encounter (Signed)
Patient called and was put on an antibiotic Doxycycline 100mg  and Predsone 10mg   For 10 days for a sinus infection. She is having excessive nose bleeds for two days. She is worried because she is taking rivaroxaban (XARELTO) 15 MG TABS tablet. Please call patient.

## 2016-07-02 NOTE — Telephone Encounter (Signed)
S/w patient. She said she went to PCP on Tuesday and it was determined she had a sinus infection. She was started on Doxycycline 100mg  BID and Prednisone 10 mg by mouth for 10 days. She says most times she blows her nose blood comes out and it take 2-3 tissues to absorb the blood until it stops. It happened 2-3 yesterday and 2-3 times today. She's been using saline nasal spray and feels like it is helping her.  Her nose is not continually bleeding or dripping.  Advised patient that if it does start bleeding and she cannot stop it to call 911 or have someone take her to the nearest ER for evaluation.  S/w Ignacia Bayley, NP. Gave him this information and he advised it would be ok to continue Xarelto at this time since the nose is not constantly bleeding but make sure patient seeks emergency medical attention if it becomes constant or worse.

## 2016-08-20 ENCOUNTER — Other Ambulatory Visit: Payer: Self-pay

## 2016-08-20 MED ORDER — DILTIAZEM HCL ER COATED BEADS 240 MG PO CP24: 240.0000 mg | ORAL_CAPSULE | Freq: Every day | ORAL | 1 refills | Status: DC

## 2016-08-20 MED ORDER — DILTIAZEM HCL ER COATED BEADS 240 MG PO CP24
240.0000 mg | ORAL_CAPSULE | Freq: Every day | ORAL | 1 refills | Status: DC
Start: 2016-08-20 — End: 2019-10-01

## 2016-11-13 NOTE — Progress Notes (Signed)
Cardiology Office Note  Date:  11/15/2016   ID:  Paula Clark, DOB April 17, 1941, MRN [id number]  PCP:  Derinda Late, MD   Chief Complaint  Patient presents with  . other    6 month f/u c/o shoulder/neck pressure. Meds reviewed verbally with pt.    HPI:  76 year old woman With history of  hypertension,  persistent atrial fibrillation,  syncope in the setting of dehydration,  Stress test 04/30/2016 no ischemia Attempted cardioversion for atrial fibrillation 05/18/2016  severe epistaxis right nostril 04/07/2016 emergency room 04/11/2016 with epistaxis x4,  seen by ENT in the emergency room Admitted to the hospital 04/12/2016 Referred to Dr. Burt Knack for [place] Who presents for follow-up of her chronic atrial fibrillation  She was evaluated by Dr. Burt Knack November 2017 for watchman device  Dr. Burt Knack was going to discuss case with ENT physician prior to scheduling TEE There was some concern that she would not tolerate aspirin and Plavix  currently tolerating  xarelto TEE was never performed    In follow-up she reports she is doing fine on anticoagulation No further bloody nose Tired a lot Friend who presents with her report she needs to do more No chest pain, no shortness of breath or leg swelling  Blood pressure at home ranging from 706 up to 237 systolic Denies any orthostasis  EKG personally reviewed by myself on todays visit shows atrial fibrillation with ventricular rate 68 bpm nonspecific ST abnormality   other past medical history reviewed  Noted to be in atrial fibrillation in the emergency room, no prior EKGs available but patient responded at that time that she had atrial fibrillation before but was not on anticoagulation  Admitted back to the hospital 04/16/2016 with fall, weakness, dehydration, creatinine 1.6 (improved with IV hydration overnight), discharge on 04/18/2016  She was seen by our office, ryan Dunn 04/21/2016, scheduled for  cardioversion Cardioversion attempted 05/18/2016 unsuccessful.  suspect  possibly chronic   Echo showed preserved LV function with mild LVH and mild valvular disease.   CHADSVASc score of at least 4.    PMH:   has a past medical history of CKD (chronic kidney disease), stage III; Dysrhythmia; Epistaxis; Gout; HLD (hyperlipidemia); Hypertension; Persistent atrial fibrillation ([place]); and Syncope.  PSH:    Past Surgical History:  Procedure Laterality Date  . APPENDECTOMY    . ELECTROPHYSIOLOGIC STUDY N/A 05/18/2016   Procedure: CARDIOVERSION;  Surgeon: Minna Merritts, MD;  Location: [facility] ORS;  Service: Cardiovascular;  Laterality: N/A;  . TONSILLECTOMY    . TOTAL VAGINAL HYSTERECTOMY      Current Outpatient Prescriptions  Medication Sig Dispense Refill  . acetaminophen (TYLENOL) 500 MG tablet Take 500-1,000 mg by mouth every 6 (six) hours as needed for moderate pain or headache.    . cetirizine (ZYRTEC) 10 MG tablet Take 10 mg by mouth daily.    . Cholecalciferol (VITAMIN D3) 1000 units CAPS Take 1,000 Units by mouth daily.    Marland Kitchen diltiazem (CARDIZEM CD) 240 MG 24 hr capsule Take 1 capsule (240 mg total) by mouth daily. 60 capsule 1  . doxazosin (CARDURA) 8 MG tablet Take 8 mg by mouth daily.     . hydrALAZINE (APRESOLINE) 50 MG tablet Take 1 tablet (50 mg total) by mouth every 8 (eight) hours. 90 tablet 11  . ranitidine (ZANTAC) 150 MG tablet Take 150 mg by mouth daily as needed for heartburn.    . rivaroxaban (XARELTO) 15 MG TABS tablet Take 1 tablet (15 mg total) by  mouth daily with supper. 30 tablet 11   No current facility-administered medications for this visit.      Allergies:   Codeine   Social History:  The patient  reports that she has quit smoking. She has never used smokeless tobacco. She reports that she does not drink alcohol or use drugs.   Family History:   family history includes Atrial fibrillation in her brother; Heart failure in her mother.    Review of  Systems: Review of Systems  Constitutional: Negative.   HENT: Negative.   Respiratory: Negative.   Cardiovascular: Negative.   Gastrointestinal: Negative.   Musculoskeletal: Negative.   Neurological: Negative.   Psychiatric/Behavioral: Negative.   All other systems reviewed and are negative.    PHYSICAL EXAM: VS:  BP 112/60 (BP Location: Left Arm, Patient Position: Sitting, Cuff Size: Normal)   Pulse 68   Ht 5' 2.5" (1.588 m)   Wt 145 lb 12 oz (66.1 kg)   BMI 26.23 kg/m  , BMI Body mass index is 26.23 kg/m. GEN: Well nourished, well developed, in no acute distress  HEENT: normal  Neck: no JVD, carotid bruits, or masses Cardiac:Irregular rate and rhythm,no murmurs, rubs, or gallops,no edema  Respiratory:  clear to auscultation bilaterally, normal work of breathing GI: soft, nontender, nondistended, + BS MS: no deformity or atrophy  Skin: warm and dry, no rash Neuro:  Strength and sensation are intact Psych: euthymic mood, full affect    Recent Labs: 04/13/2016: TSH 1.711 04/15/2016: Magnesium 1.6 04/17/2016: Hemoglobin 10.6 05/06/2016: BUN 21; Creatinine, Ser 1.37; Platelets 498; Potassium 3.9; Sodium 140    Lipid Panel No results found for: CHOL, HDL, LDLCALC, TRIG    Wt Readings from Last 3 Encounters:  11/15/16 145 lb 12 oz (66.1 kg)  05/27/16 146 lb 1.9 oz (66.3 kg)  05/25/16 147 lb 8 oz (66.9 kg)       ASSESSMENT AND PLAN:  Persistent atrial fibrillation (HCC) - Plan: EKG 12-Lead  tolerating xarelto 15 mg daily (CrCl of 35.46 mL/min on the labs drawn on 10/26 continue reduced dose Xarelto 15 mg q dinner) CHADS VASC 4 No nosebleeds  Essential hypertension - Plan: EKG 12-Lead Blood pressure is well controlled on today's visit. No changes made to the medications.  Epistaxis No recent bleeding,  on xarelto  Encounter for anticoagulation discussion and counseling She will continue on the xarelto 15 mg daily She will follow-up with primary care for  basic metabolic panel   Total encounter time more than 25 minutes  Greater than 50% was spent in counseling and coordination of care with the patient   Disposition:   F/U  6 months   Orders Placed This Encounter  Procedures  . EKG 12-Lead     Signed, Esmond Plants, M.D., Ph.D. 11/15/2016  [facility] [place], [place] [phone]

## 2016-11-15 ENCOUNTER — Encounter: Payer: Self-pay | Admitting: Cardiovascular Disease

## 2016-11-15 ENCOUNTER — Ambulatory Visit (INDEPENDENT_AMBULATORY_CARE_PROVIDER_SITE_OTHER): Payer: Medicare HMO | Admitting: Cardiovascular Disease

## 2016-11-15 VITALS — BP 112/60 | HR 68 | Ht 62.5 in | Wt 145.8 lb

## 2016-11-15 DIAGNOSIS — N179 Acute kidney failure, unspecified: Secondary | ICD-10-CM

## 2016-11-15 DIAGNOSIS — I160 Hypertensive urgency: Secondary | ICD-10-CM | POA: Diagnosis not present

## 2016-11-15 DIAGNOSIS — I481 Persistent atrial fibrillation: Secondary | ICD-10-CM

## 2016-11-15 DIAGNOSIS — R55 Syncope and collapse: Secondary | ICD-10-CM | POA: Diagnosis not present

## 2016-11-15 DIAGNOSIS — I4819 Other persistent atrial fibrillation: Secondary | ICD-10-CM

## 2016-11-15 DIAGNOSIS — I159 Secondary hypertension, unspecified: Secondary | ICD-10-CM | POA: Diagnosis not present

## 2016-11-15 NOTE — Patient Instructions (Addendum)
Continue to monitor  Medication Instructions:   No medication changes made  Labwork:  No new labs needed  Testing/Procedures:  No further testing at this time   I recommend watching educational videos on topics of interest to you at:       [email or web address]  Enter code: [email or web address]    Follow-Up: It was a pleasure seeing you in the office today. Please call us if you have new issues that need to be addressed before your next appt.  [phone]  Your physician wants you to follow-up in: 6 months.  You will receive a reminder letter in the mail two months in advance. If you don't receive a letter, please call our office to schedule the follow-up appointment.  If you need a refill on your cardiac medications before your next appointment, please call your pharmacy.

## 2016-12-13 ENCOUNTER — Other Ambulatory Visit: Payer: Self-pay | Admitting: Cardiovascular Disease

## 2017-01-03 DIAGNOSIS — Z79899 Other long term (current) drug therapy: Secondary | ICD-10-CM | POA: Diagnosis not present

## 2017-01-03 DIAGNOSIS — N183 Chronic kidney disease, stage 3 (moderate): Secondary | ICD-10-CM | POA: Diagnosis not present

## 2017-01-03 DIAGNOSIS — I10 Essential (primary) hypertension: Secondary | ICD-10-CM | POA: Diagnosis not present

## 2017-01-10 ENCOUNTER — Telehealth: Payer: Self-pay | Admitting: Cardiovascular Disease

## 2017-01-10 DIAGNOSIS — Z0000 Encounter for general adult medical examination without abnormal findings: Secondary | ICD-10-CM | POA: Diagnosis not present

## 2017-01-10 DIAGNOSIS — N183 Chronic kidney disease, stage 3 (moderate): Secondary | ICD-10-CM | POA: Diagnosis not present

## 2017-01-10 NOTE — Telephone Encounter (Signed)
Pt was advised to call us from her PCP Dr Jimmie Molly   He had some concerns  Pt had told him that she has been very tired and just wipes out lately and her BP (He told her we could pull It up on the computer) that he was a bit concerned about that today as well  He advised that we may need to look at lowering patient's Doxazosin   She is not sure what to do   Would like a call back   Please advise.

## 2017-01-10 NOTE — Telephone Encounter (Signed)
Per Dr. Abbe Amsterdam note from today:  "She has been feeling washed out. I don't think this is due to her very slightly low hemoglobin. I think it is possible that she is having moments when her diastolic blood pressure is dropping low. I recommended that she talk to Dr. Rockey Situ to see if he though doing a trial of reducing her doxazosin or hydralazine would be worth considering."

## 2017-01-13 NOTE — Telephone Encounter (Signed)
Consider holding the hydralazine Change the Cardura to one half pill twice a day Monitor blood pressure and call our office if numbers run high

## 2017-01-14 NOTE — Telephone Encounter (Signed)
Spoke w/ pt.  Advised her of Dr. Donivan Scull recommendation.  She reports that her BP was running high this am, but she believes she took it too soon after taking her meds. She reports that since then, her BP has been good. She would prefer to continue to monitor and call back if readings are abnormal, as she does not want to make any changes at this time.

## 2017-01-14 NOTE — Telephone Encounter (Signed)
Left message for pt to call back  °

## 2017-02-08 DIAGNOSIS — R208 Other disturbances of skin sensation: Secondary | ICD-10-CM | POA: Diagnosis not present

## 2017-02-08 DIAGNOSIS — D225 Melanocytic nevi of trunk: Secondary | ICD-10-CM | POA: Diagnosis not present

## 2017-02-08 DIAGNOSIS — D2272 Melanocytic nevi of left lower limb, including hip: Secondary | ICD-10-CM | POA: Diagnosis not present

## 2017-02-08 DIAGNOSIS — C44311 Basal cell carcinoma of skin of nose: Secondary | ICD-10-CM | POA: Diagnosis not present

## 2017-02-08 DIAGNOSIS — X32XXXA Exposure to sunlight, initial encounter: Secondary | ICD-10-CM | POA: Diagnosis not present

## 2017-02-08 DIAGNOSIS — D485 Neoplasm of uncertain behavior of skin: Secondary | ICD-10-CM | POA: Diagnosis not present

## 2017-02-08 DIAGNOSIS — L570 Actinic keratosis: Secondary | ICD-10-CM | POA: Diagnosis not present

## 2017-02-08 DIAGNOSIS — D2261 Melanocytic nevi of right upper limb, including shoulder: Secondary | ICD-10-CM | POA: Diagnosis not present

## 2017-02-08 DIAGNOSIS — L821 Other seborrheic keratosis: Secondary | ICD-10-CM | POA: Diagnosis not present

## 2017-02-08 DIAGNOSIS — C44722 Squamous cell carcinoma of skin of right lower limb, including hip: Secondary | ICD-10-CM | POA: Diagnosis not present

## 2017-03-07 DIAGNOSIS — I481 Persistent atrial fibrillation: Secondary | ICD-10-CM | POA: Diagnosis not present

## 2017-03-07 DIAGNOSIS — M7122 Synovial cyst of popliteal space [Baker], left knee: Secondary | ICD-10-CM | POA: Diagnosis not present

## 2017-03-10 DIAGNOSIS — Z85828 Personal history of other malignant neoplasm of skin: Secondary | ICD-10-CM | POA: Diagnosis not present

## 2017-03-10 DIAGNOSIS — C44722 Squamous cell carcinoma of skin of right lower limb, including hip: Secondary | ICD-10-CM | POA: Diagnosis not present

## 2017-03-17 DIAGNOSIS — Z85828 Personal history of other malignant neoplasm of skin: Secondary | ICD-10-CM | POA: Diagnosis not present

## 2017-03-17 DIAGNOSIS — C44311 Basal cell carcinoma of skin of nose: Secondary | ICD-10-CM | POA: Diagnosis not present

## 2017-04-01 ENCOUNTER — Other Ambulatory Visit: Payer: Self-pay | Admitting: *Deleted

## 2017-04-01 DIAGNOSIS — I4819 Other persistent atrial fibrillation: Secondary | ICD-10-CM

## 2017-04-01 MED ORDER — RIVAROXABAN 15 MG PO TABS: 15.0000 mg | ORAL_TABLET | Freq: Every day | ORAL | 3 refills | Status: DC

## 2017-04-09 ENCOUNTER — Other Ambulatory Visit: Payer: Self-pay | Admitting: Cardiovascular Disease

## 2017-04-25 DIAGNOSIS — Z23 Encounter for immunization: Secondary | ICD-10-CM | POA: Diagnosis not present

## 2017-04-25 DIAGNOSIS — J0100 Acute maxillary sinusitis, unspecified: Secondary | ICD-10-CM | POA: Diagnosis not present

## 2017-05-07 ENCOUNTER — Other Ambulatory Visit: Payer: Self-pay | Admitting: Cardiovascular Disease

## 2017-05-10 ENCOUNTER — Telehealth: Payer: Self-pay | Admitting: Cardiovascular Disease

## 2017-05-10 NOTE — Telephone Encounter (Signed)
Pt daughter states pt has been weak and thinks it may be from medications she is on. She has noticed a change in her "color". States she has some congestion for "months". Please call. Pt is scheduled to see Christell Faith, PA on 11/6

## 2017-05-10 NOTE — Telephone Encounter (Signed)
Spoke w/ pt's daughter.  She reports that pt has continued to have congestion, sore throat and pain at the back of her tongue.  She has seen PCP and been prescribed abx w/ no refilef.  Pt is pale, weak and does not have the strength to take her shower or perform her ADLs.  She is frustrated that pt has been sick for so long and has not gotten better.  Paula Clark has multiple health issues and is unable to care for her mother as much as she would like, esp since they live about 30 mins away from each other. Paula Clark to check pt's BP and HR and call back if pt's HR is elevated or irregular, as she may be in afib and we can have her come in for EKG. Recommended she speak w/ pt's PCP regarding other issues, but she is not happy w/ pt's current PCP. Provided her w/ names of local PCPs and also recommended she take pt to Urgent Care for eval if she feels pt needs immediate attention.  She is agreeable and appreciative of the call.  Asked her to call back if we can be of further assistance.

## 2017-05-12 ENCOUNTER — Encounter: Payer: Self-pay | Admitting: Physician Assistant

## 2017-05-17 ENCOUNTER — Ambulatory Visit: Payer: Medicare HMO | Admitting: Physician Assistant

## 2017-05-17 ENCOUNTER — Ambulatory Visit: Payer: Medicare HMO | Admitting: Cardiovascular Disease

## 2017-05-17 ENCOUNTER — Encounter: Payer: Self-pay | Admitting: Cardiovascular Disease

## 2017-05-17 VITALS — BP 120/56 | HR 65 | Ht 63.0 in | Wt 137.0 lb

## 2017-05-17 DIAGNOSIS — R531 Weakness: Secondary | ICD-10-CM | POA: Diagnosis not present

## 2017-05-17 DIAGNOSIS — I481 Persistent atrial fibrillation: Secondary | ICD-10-CM

## 2017-05-17 DIAGNOSIS — I10 Essential (primary) hypertension: Secondary | ICD-10-CM

## 2017-05-17 DIAGNOSIS — R040 Epistaxis: Secondary | ICD-10-CM

## 2017-05-17 DIAGNOSIS — I4819 Other persistent atrial fibrillation: Secondary | ICD-10-CM

## 2017-05-17 NOTE — Patient Instructions (Addendum)
Medication Instructions:   Please hold the hydralazine (ok to take 1/2 pill or whole if needed for high blood pressure)  Monitor blood pressure  Labwork:  No new labs needed  Testing/Procedures:  No further testing at this time   Follow-Up: It was a pleasure seeing you in the office today. Please call us if you have new issues that need to be addressed before your next appt.  [phone]  Your physician wants you to follow-up in: 12 months.  You will receive a reminder letter in the mail two months in advance. If you don't receive a letter, please call our office to schedule the follow-up appointment.  If you need a refill on your cardiac medications before your next appointment, please call your pharmacy.

## 2017-05-17 NOTE — Progress Notes (Signed)
Cardiology Office Note  Date:  05/17/2017   ID:  Paula Clark, DOB 13-Feb-1941, MRN [id number]  PCP:  Derinda Late, MD   Chief Complaint  Patient presents with  . other    6 month follow up. Meds reviewed by the pt. verbally. "doing well."     HPI:  76 year old woman With history of  hypertension,  persistent atrial fibrillation,  syncope in the setting of dehydration,  Stress test 04/30/2016 no ischemia Attempted cardioversion for atrial fibrillation 05/18/2016  severe epistaxis right nostril 04/07/2016 emergency room 04/11/2016 with epistaxis x4,  seen by ENT in the emergency room Admitted to the hospital 04/12/2016 Referred to Dr. Burt Knack for [place] Who presents for follow-up of her chronic atrial fibrillation  In follow-up she reports that she has been having numerous issues chronic fatigue, weakness, legs do not work, unable to walk very far Chronic sinusitis and cough past several weeks Working with primary care on her upper respiratory infection  Denies having significant nosebleeds in the past year Tolerating anticoagulation  Not eating as much, weight loss of 9 pounds in the past year Periodic low blood pressures  Lab work reviewed with her in detail Total chol 166, LDL 104  EKG personally reviewed by myself on todays visit shows atrial fibrillation with ventricular rate 65 bpm nonspecific ST abnormality  Other past medical history reviewed evaluated by Dr. Burt Knack November 2017 for watchman device  Dr. Burt Knack was going to discuss case with ENT physician prior to scheduling TEE There was some concern that she would not tolerate aspirin and Plavix TEE was never performed    Noted to be in atrial fibrillation in the emergency room, no prior EKGs available but patient responded at that time that she had atrial fibrillation before but was not on anticoagulation  Admitted back to the hospital 04/16/2016 with fall, weakness, dehydration, creatinine 1.6  (improved with IV hydration overnight), discharge on 04/18/2016  She was seen by our office, ryan Dunn 04/21/2016, scheduled for cardioversion Cardioversion attempted 05/18/2016 unsuccessful.  suspect  possibly chronic   Echo showed preserved LV function with mild LVH and mild valvular disease.   CHADSVASc score of at least 4.    PMH:   has a past medical history of Chronic atrial fibrillation ([place]), CKD (chronic kidney disease), stage III ([place]), Epistaxis, Gout, HLD (hyperlipidemia), Hypertension, Mitral regurgitation, Persistent atrial fibrillation ([place]), and Syncope.  PSH:    Past Surgical History:  Procedure Laterality Date  . APPENDECTOMY    . TONSILLECTOMY    . TOTAL VAGINAL HYSTERECTOMY      Current Outpatient Medications  Medication Sig Dispense Refill  . acetaminophen (TYLENOL) 500 MG tablet Take 500-1,000 mg by mouth every 6 (six) hours as needed for moderate pain or headache.    . cetirizine (ZYRTEC) 10 MG tablet Take 10 mg by mouth daily.    . Cholecalciferol (VITAMIN D3) 1000 units CAPS Take 1,000 Units by mouth daily.    Marland Kitchen diltiazem (CARDIZEM CD) 240 MG 24 hr capsule Take 1 capsule (240 mg total) by mouth daily. 60 capsule 1  . diltiazem (TIAZAC) 240 MG 24 hr capsule TAKE 1 CAPSULE BY MOUTH EVERY DAY 60 capsule 3  . doxazosin (CARDURA) 8 MG tablet Take 8 mg by mouth daily.     . hydrALAZINE (APRESOLINE) 50 MG tablet TAKE 1 TABLET (50 MG TOTAL) BY MOUTH EVERY 8 (EIGHT) HOURS. 90 tablet 2  . ranitidine (ZANTAC) 150 MG tablet Take 150 mg by mouth daily as  needed for heartburn.    . Rivaroxaban (XARELTO) 15 MG TABS tablet Take 1 tablet (15 mg total) by mouth daily with supper. 90 tablet 3   No current facility-administered medications for this visit.      Allergies:   Codeine   Social History:  The patient  reports that she has quit smoking. she has never used smokeless tobacco. She reports that she does not drink alcohol or use drugs.   Family History:   family  history includes Atrial fibrillation in her brother; Heart failure in her mother.    Review of Systems: Review of Systems  Constitutional: Positive for malaise/fatigue.  HENT: Negative.   Respiratory: Negative.   Cardiovascular: Negative.   Gastrointestinal: Negative.   Musculoskeletal: Negative.        Leg weakness  Neurological: Positive for weakness.  Psychiatric/Behavioral: Negative.   All other systems reviewed and are negative.    PHYSICAL EXAM: VS:  BP (!) 120/56 (BP Location: Left Arm, Patient Position: Sitting, Cuff Size: Normal)   Pulse 65   Ht 5\' 3"  (1.6 m)   Wt 137 lb (62.1 kg)   BMI 24.27 kg/m  , BMI Body mass index is 24.27 kg/m. GEN: Well nourished, well developed, in no acute distress  HEENT: normal  Neck: no JVD, carotid bruits, or masses Cardiac:Irregular rate and rhythm,no murmurs, rubs, or gallops,no edema  Respiratory:  clear to auscultation bilaterally, normal work of breathing GI: soft, nontender, nondistended, + BS MS: no deformity or atrophy  Skin: warm and dry, no rash Neuro:  Strength and sensation are intact Psych: euthymic mood, full affect    Recent Labs: No results found for requested labs within last 8760 hours.    Lipid Panel No results found for: CHOL, HDL, LDLCALC, TRIG    Wt Readings from Last 3 Encounters:  05/17/17 137 lb (62.1 kg)  11/15/16 145 lb 12 oz (66.1 kg)  05/27/16 146 lb 1.9 oz (66.3 kg)       ASSESSMENT AND PLAN:  Persistent atrial fibrillation (HCC) - Plan: EKG 12-Lead  tolerating xarelto 15 mg daily Low creatinine clearance, denies any nosebleeds CHADS VASC 4 rate well controlled  Essential hypertension - Plan: EKG 12-Lead 9 pound weight loss over the past year, reports having low blood pressures at home Recommended she hold the hydralazine  Epistaxis No recent bleeding, tolerating anticoagulation  Weakness Recommended regular exercise program Unclear if she is overmedicated, will hold the  hydralazine  Recommended she monitor blood pressure   Total encounter time more than 25 minutes  Greater than 50% was spent in counseling and coordination of care with the patient   Disposition:   F/U  12 months   Orders Placed This Encounter  Procedures  . EKG 12-Lead     Signed, Esmond Plants, M.D., Ph.D. 05/17/2017  [place], [place]

## 2017-07-22 DIAGNOSIS — N183 Chronic kidney disease, stage 3 (moderate): Secondary | ICD-10-CM | POA: Diagnosis not present

## 2017-07-29 DIAGNOSIS — N183 Chronic kidney disease, stage 3 (moderate): Secondary | ICD-10-CM | POA: Diagnosis not present

## 2017-07-29 DIAGNOSIS — I480 Paroxysmal atrial fibrillation: Secondary | ICD-10-CM | POA: Diagnosis not present

## 2017-07-29 DIAGNOSIS — M79671 Pain in right foot: Secondary | ICD-10-CM | POA: Diagnosis not present

## 2017-07-29 DIAGNOSIS — Z79899 Other long term (current) drug therapy: Secondary | ICD-10-CM | POA: Diagnosis not present

## 2017-07-29 DIAGNOSIS — M79672 Pain in left foot: Secondary | ICD-10-CM | POA: Diagnosis not present

## 2017-07-29 DIAGNOSIS — I10 Essential (primary) hypertension: Secondary | ICD-10-CM | POA: Diagnosis not present

## 2017-09-05 ENCOUNTER — Telehealth: Payer: Self-pay | Admitting: Cardiovascular Disease

## 2017-09-05 NOTE — Telephone Encounter (Signed)
Spoke with patient and reviewed blood pressure readings. She reports that she has had a bad cold and has also been taking over the counter remedies. Reviewed that some of those over the counter products can increase blood pressures and she does report taking decongestant type and recommended that she try the remedies for those with high blood pressure. Instructed her to monitor blood pressures and keep a log of those readings. Reviewed that she can take Hydralazine 50 mg 1/2 tablet to 1 whole tablet every 8 hours as needed for elevated readings. Discussed signs and symptoms to monitor and that if her blood pressures persist or worsen to call back. Reviewed that being sick will certainly cause elevated readings and that hopefully when she gets over this illness it should improve. She verbalized understanding of our conversation, agreement with plan, and had no further questions at this time.

## 2017-09-05 NOTE — Telephone Encounter (Signed)
Pt c/o BP issue: STAT if pt c/o blurred vision, one-sided weakness or slurred speech  1. What are your last 5 BP readings?  09/03/17 am 158/76  PM 160/81  09/04/17 am 160/73  PM 148/70   09/05/17 am 148/78  PM   2. Are you having any other symptoms (ex. Dizziness, headache, blurred vision, passed out)? No, just head cold.    3. What is your BP issue? Running lower than normal. She states she's had a head cold. Is almost over that but BP is still low

## 2017-09-09 IMAGING — DX DG CHEST 1V PORT
1 series · 1 of 1 positions shown · non-contrast
Comparison: None.

CLINICAL DATA: 75-year-old female with cough

EXAM:
PORTABLE CHEST 1 VIEW

[chest ap]
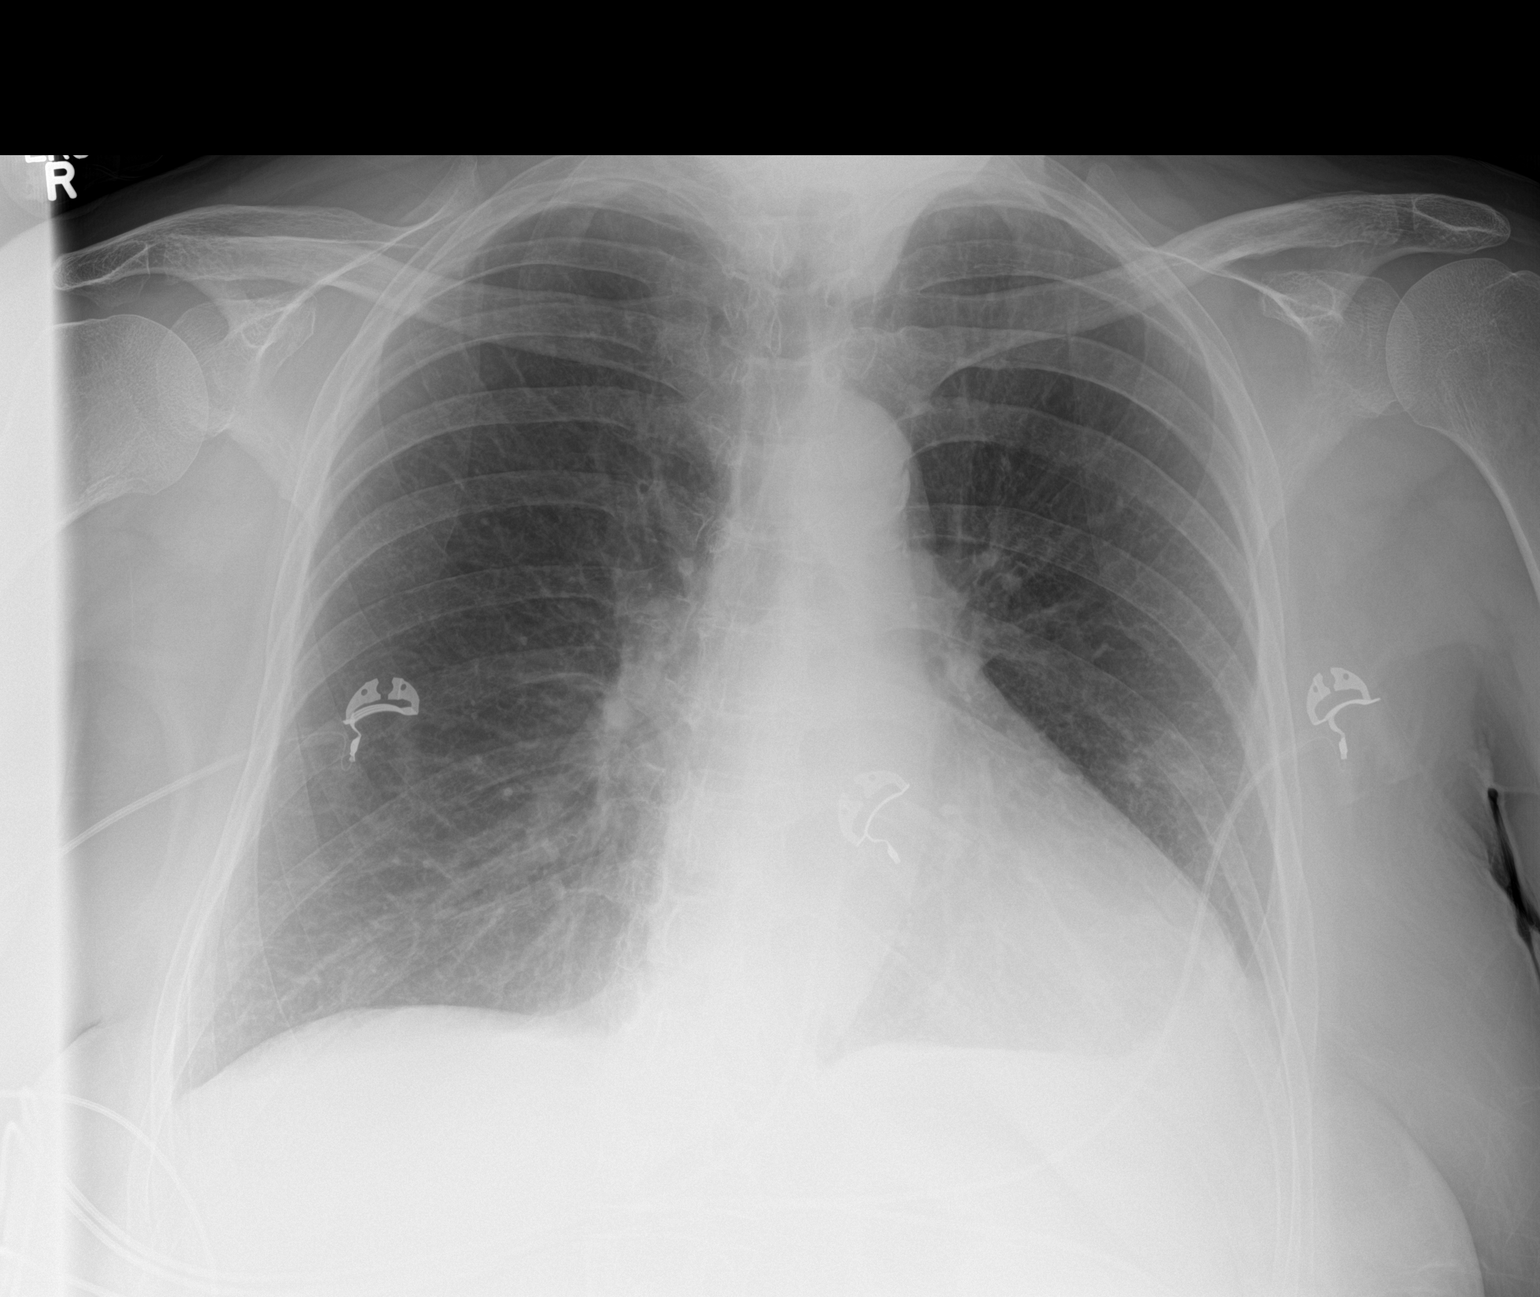

[1 of 1 positions shown; findings below may reference images not displayed]

FINDINGS: Single portable view of the chest demonstrate an area of increased
density at the left lung base concerning for atelectasis versus
infiltrate. A small left pleural effusion may be present. The right
lung is clear. No pneumothorax. Mild cardiomegaly. No acute osseous
pathology.
IMPRESSION: Focal left lung base atelectasis versus infiltration.

## 2017-12-13 ENCOUNTER — Other Ambulatory Visit: Payer: Self-pay | Admitting: Cardiovascular Disease

## 2018-01-06 DIAGNOSIS — I10 Essential (primary) hypertension: Secondary | ICD-10-CM | POA: Diagnosis not present

## 2018-01-06 DIAGNOSIS — N183 Chronic kidney disease, stage 3 (moderate): Secondary | ICD-10-CM | POA: Diagnosis not present

## 2018-01-13 DIAGNOSIS — Z0000 Encounter for general adult medical examination without abnormal findings: Secondary | ICD-10-CM | POA: Diagnosis not present

## 2018-01-27 DIAGNOSIS — L820 Inflamed seborrheic keratosis: Secondary | ICD-10-CM | POA: Diagnosis not present

## 2018-01-27 DIAGNOSIS — L298 Other pruritus: Secondary | ICD-10-CM | POA: Diagnosis not present

## 2018-01-27 DIAGNOSIS — Z08 Encounter for follow-up examination after completed treatment for malignant neoplasm: Secondary | ICD-10-CM | POA: Diagnosis not present

## 2018-01-27 DIAGNOSIS — X32XXXA Exposure to sunlight, initial encounter: Secondary | ICD-10-CM | POA: Diagnosis not present

## 2018-01-27 DIAGNOSIS — D171 Benign lipomatous neoplasm of skin and subcutaneous tissue of trunk: Secondary | ICD-10-CM | POA: Diagnosis not present

## 2018-01-27 DIAGNOSIS — Z85828 Personal history of other malignant neoplasm of skin: Secondary | ICD-10-CM | POA: Diagnosis not present

## 2018-01-27 DIAGNOSIS — L538 Other specified erythematous conditions: Secondary | ICD-10-CM | POA: Diagnosis not present

## 2018-01-27 DIAGNOSIS — L570 Actinic keratosis: Secondary | ICD-10-CM | POA: Diagnosis not present

## 2018-03-23 IMAGING — US US EXTREM LOW VENOUS*L*
1 series · 14 of 24 positions shown · non-contrast
Comparison: None

CLINICAL DATA: Left knee pain x3 days

EXAM:
LEFT LOWER EXTREMITY VENOUS DOPPLER ULTRASOUND
TECHNIQUE: Gray-scale sonography with compression, as well as color and duplex
ultrasound, were performed to evaluate the deep venous system from
the level of the common femoral vein through the popliteal and
proximal calf veins.

[Series 1: us extrem low venous*left* · 0.08mm/px · 14 of 54 slices shown]
[im 1/54]
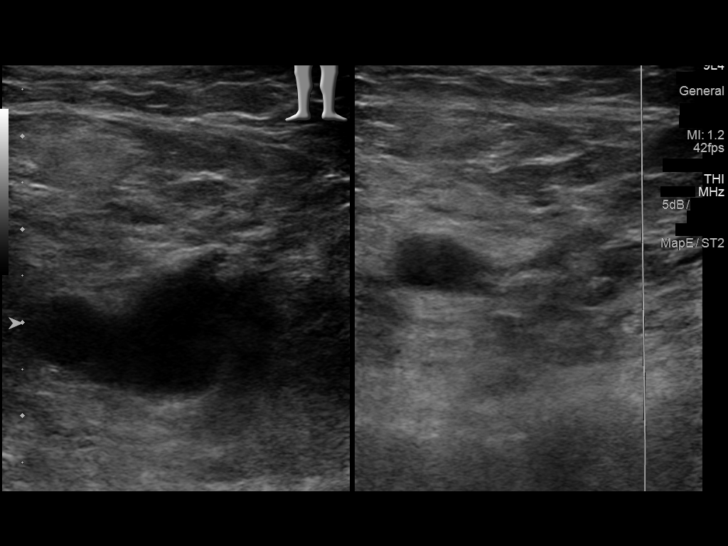
[im 5/54]
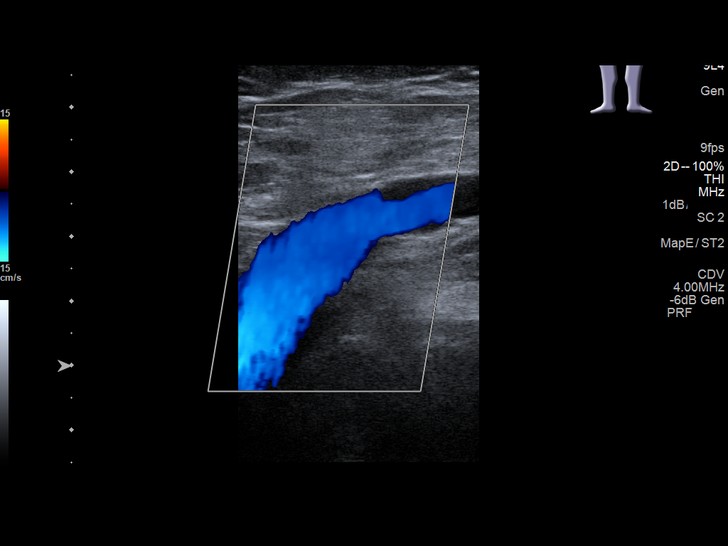
[im 10/54]
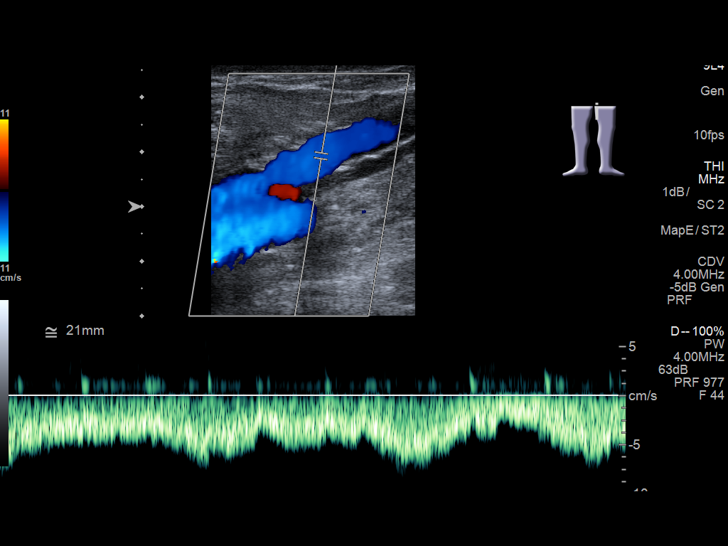
[im 14/54]
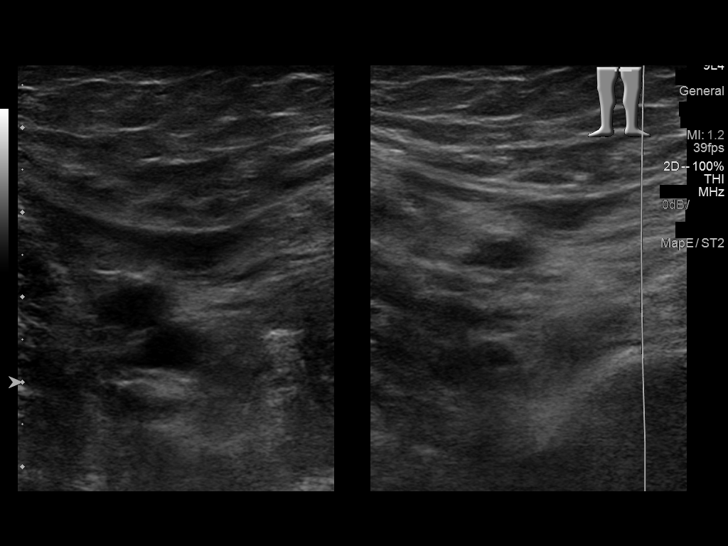
[im 17/54]
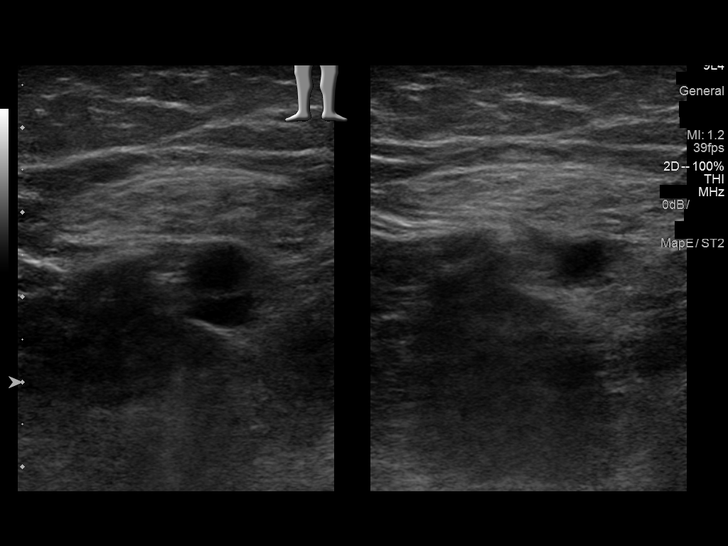
[im 21/54]
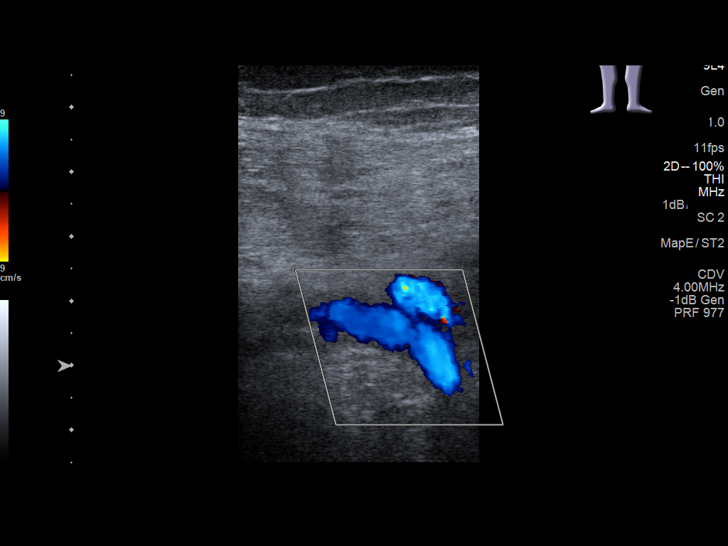
[im 26/54]
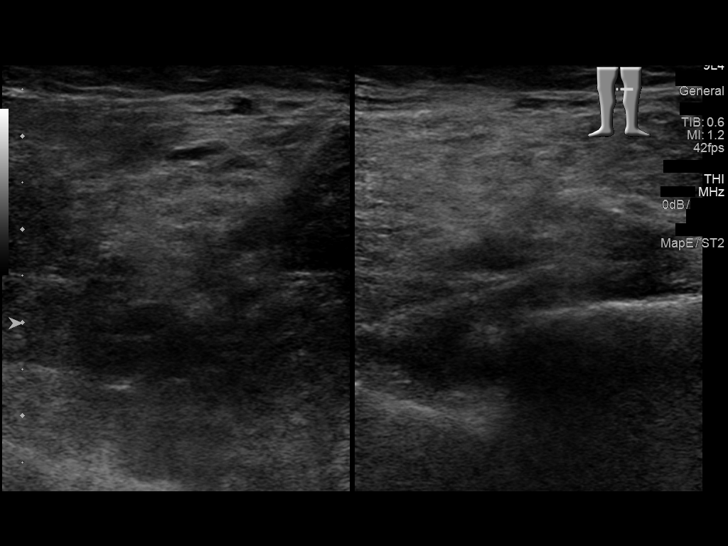
[im 28/54]
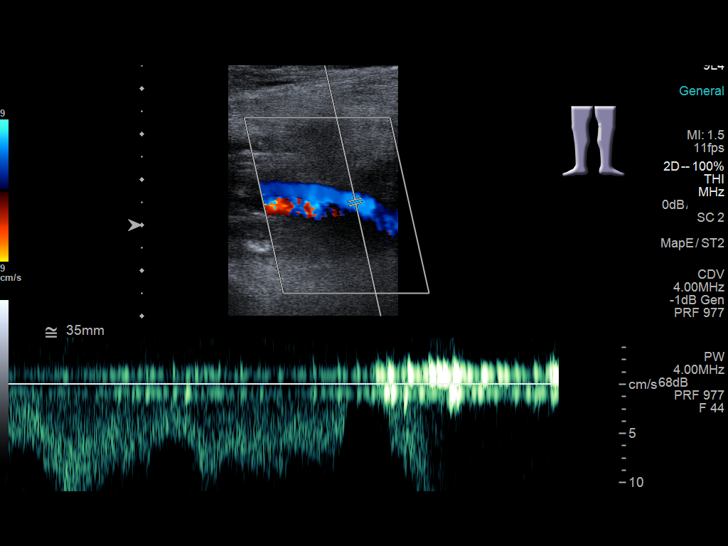
[im 33/54]
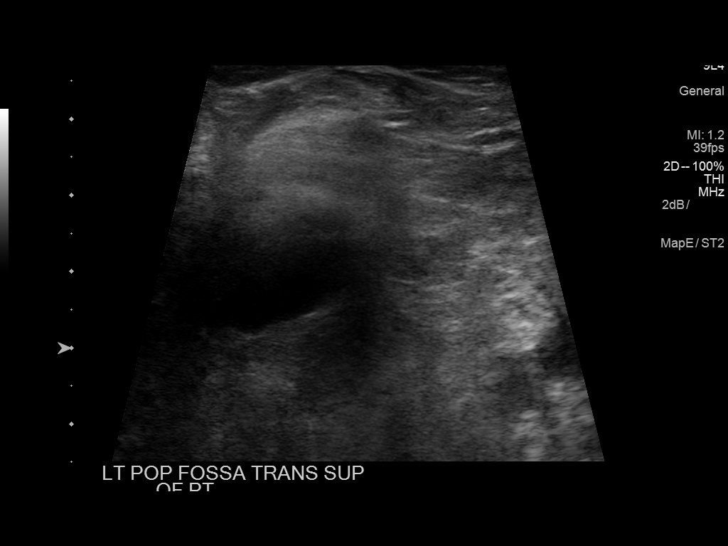
[im 37/54]
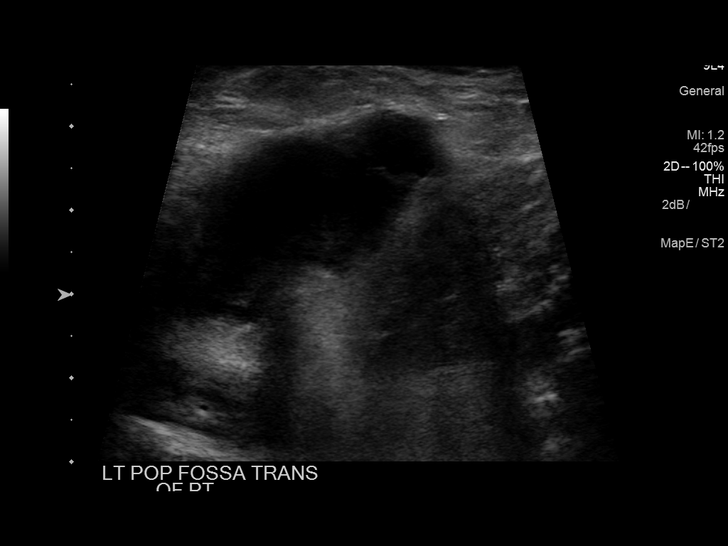
[im 42/54]
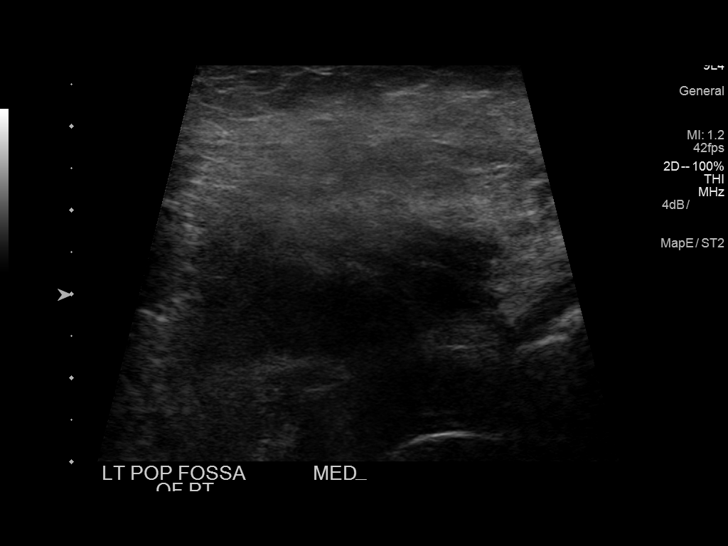
[im 44/54]
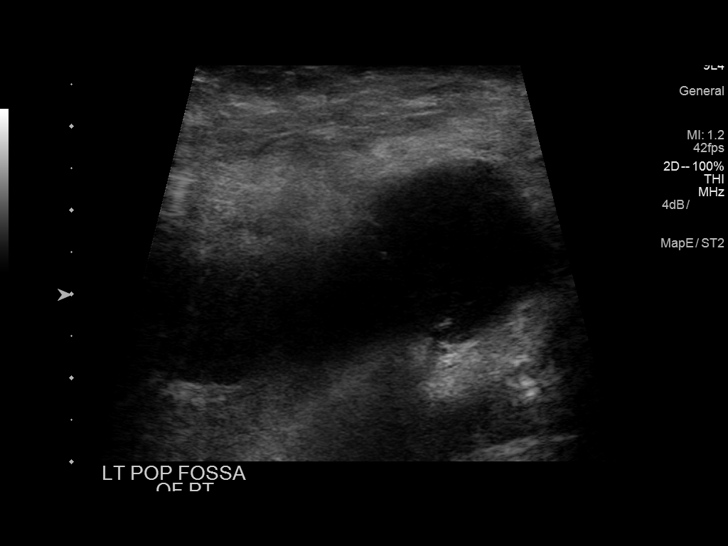
[im 49/54]
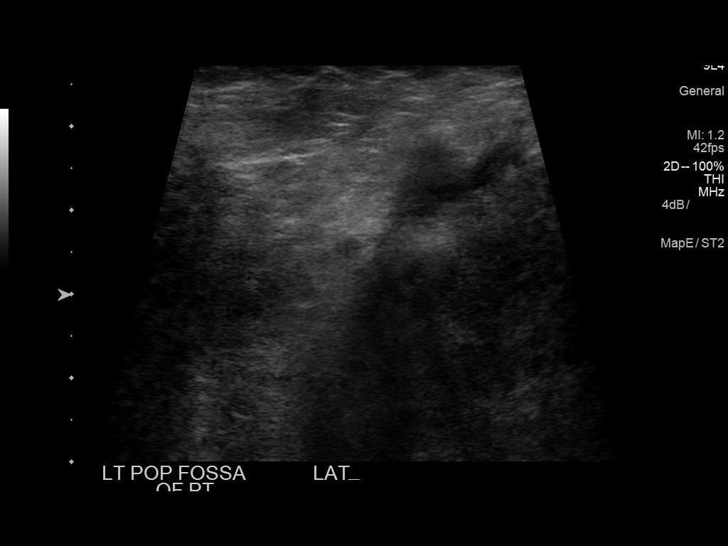
[im 54/54]
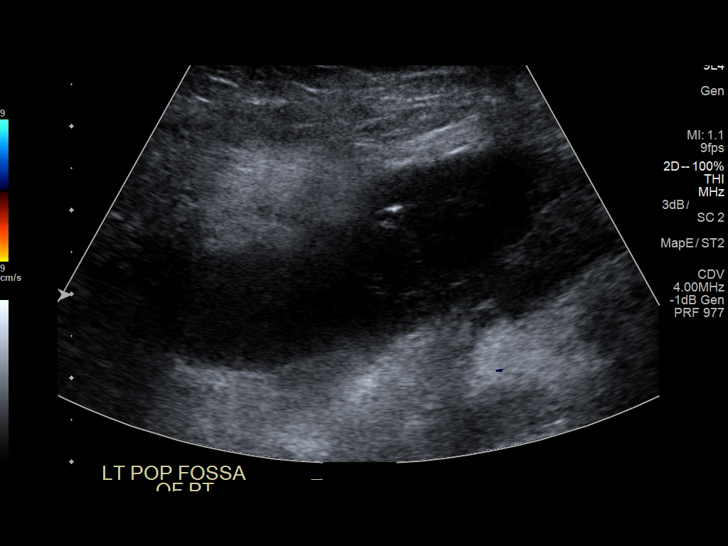

[14 of 24 positions shown; findings below may reference images not displayed]

FINDINGS: Normal compressibility of the common femoral, superficial femoral,
and popliteal veins, as well as the proximal calf veins. No filling
defects to suggest DVT on grayscale or color Doppler imaging.
Doppler waveforms show normal direction of venous flow, normal
respiratory phasicity and response to augmentation. Elongated
complex cyst 5.4 x 2.1 x 4.5 cm in the posterior popliteal fossa.
Survey views of the contralateral common femoral vein are
unremarkable.
IMPRESSION: 1. No evidence of  lower extremity deep vein thrombosis, left.
2. Baker's cyst.

## 2018-03-29 ENCOUNTER — Other Ambulatory Visit: Payer: Self-pay | Admitting: Cardiovascular Disease

## 2018-03-29 DIAGNOSIS — I4819 Other persistent atrial fibrillation: Secondary | ICD-10-CM

## 2018-03-29 NOTE — Telephone Encounter (Signed)
Refill Request.  

## 2018-05-09 DIAGNOSIS — N390 Urinary tract infection, site not specified: Secondary | ICD-10-CM | POA: Diagnosis not present

## 2018-05-09 DIAGNOSIS — R300 Dysuria: Secondary | ICD-10-CM | POA: Diagnosis not present

## 2018-05-23 DIAGNOSIS — R8290 Unspecified abnormal findings in urine: Secondary | ICD-10-CM | POA: Diagnosis not present

## 2018-05-23 DIAGNOSIS — R300 Dysuria: Secondary | ICD-10-CM | POA: Diagnosis not present

## 2018-05-23 DIAGNOSIS — Z87440 Personal history of urinary (tract) infections: Secondary | ICD-10-CM | POA: Diagnosis not present

## 2018-06-05 ENCOUNTER — Other Ambulatory Visit: Payer: Self-pay | Admitting: Cardiovascular Disease

## 2018-06-23 ENCOUNTER — Other Ambulatory Visit: Payer: Self-pay | Admitting: Cardiovascular Disease

## 2018-06-23 DIAGNOSIS — I4819 Other persistent atrial fibrillation: Secondary | ICD-10-CM

## 2018-06-23 NOTE — Telephone Encounter (Signed)
Refill Request.  

## 2018-07-21 ENCOUNTER — Other Ambulatory Visit: Payer: Self-pay | Admitting: Cardiovascular Disease

## 2018-07-21 DIAGNOSIS — I4819 Other persistent atrial fibrillation: Secondary | ICD-10-CM

## 2018-07-21 NOTE — Telephone Encounter (Signed)
Please review for refill.  

## 2018-07-28 ENCOUNTER — Ambulatory Visit: Payer: Medicare HMO | Admitting: Nurse Practitioner

## 2018-07-28 ENCOUNTER — Encounter: Payer: Self-pay | Admitting: Nurse Practitioner

## 2018-07-28 VITALS — BP 136/60 | HR 55 | Ht 63.0 in | Wt 147.0 lb

## 2018-07-28 DIAGNOSIS — I4819 Other persistent atrial fibrillation: Secondary | ICD-10-CM

## 2018-07-28 DIAGNOSIS — I10 Essential (primary) hypertension: Secondary | ICD-10-CM

## 2018-07-28 DIAGNOSIS — R531 Weakness: Secondary | ICD-10-CM

## 2018-07-28 NOTE — Patient Instructions (Signed)
Medication Instructions:  No changes If you need a refill on your cardiac medications before your next appointment, please call your pharmacy.   Lab work: None  If you have labs (blood work) drawn today and your tests are completely normal, you will receive your results only by: Marland Kitchen MyChart Message (if you have MyChart) OR . A paper copy in the mail If you have any lab test that is abnormal or we need to change your treatment, we will call you to review the results.  Testing/Procedures: None  Follow-Up: At [facility], you and your health needs are our priority.  As part of our continuing mission to provide you with exceptional heart care, we have created designated Provider Care Teams.  These Care Teams include your primary Cardiologist (physician) and Advanced Practice Providers (APPs -  Physician Assistants and Nurse Practitioners) who all work together to provide you with the care you need, when you need it. You will need a follow up appointment in 1 years.  Please call our office 2 months in advance to schedule this appointment.  You may see Dr. Ida Rogue or one of the following Advanced Practice Providers on your designated Care Team:   Murray Hodgkins, NP Christell Faith, PA-C . Marrianne Mood, PA-C  Any Other Special Instructions Will Be Listed Below (If Applicable).

## 2018-07-28 NOTE — Progress Notes (Signed)
Office Visit    Patient Name: NATALIEE SHURTZ Date of Encounter: 07/28/2018  Primary Care Provider:  Derinda Late, MD Primary Cardiologist:  Ida Rogue, MD  Chief Complaint    78 year old female with a history of permanent atrial fibrillation, remote syncope in the setting of dehydration, mild mitral regurgitation, hypertension, stage III chronic kidney disease, hyperlipidemia, and history of epistaxis, who presents for follow-up of A. fib.  Past Medical History    Past Medical History:  Diagnosis Date  . Chronic atrial fibrillation    a. Unsuccessful DCCV 05/2016; b. CHADS2VASc => 4 (HTN, age x 2, female)--> Xarelto.  . CKD (chronic kidney disease), stage III ([place])   . Epistaxis   . Gout   . History of stress test    a. 04/2016 MV: EF 80%, no ischemia.  Marland Kitchen HLD (hyperlipidemia)   . Hypertension   . Mitral regurgitation    a. Echo 04/2016: EF 60-65%, no RWMA, mild AI, mild MR, mild biatrial enlargement, PASP 32 mmHg, trivial pericardial effusion noted posterior to the heart  . Syncope    Past Surgical History:  Procedure Laterality Date  . APPENDECTOMY    . ELECTROPHYSIOLOGIC STUDY N/A 05/18/2016   Procedure: CARDIOVERSION;  Surgeon: Minna Merritts, MD;  Location: [facility] ORS;  Service: Cardiovascular;  Laterality: N/A;  . TONSILLECTOMY    . TOTAL VAGINAL HYSTERECTOMY      Allergies  Allergies  Allergen Reactions  . Codeine Nausea And Vomiting    History of Present Illness    78 year old female with the above past medical history including hypertension, hyperlipidemia, mild mitral regurgitation, stage III chronic kidney disease, and chronic atrial fibrillation.  She also has a history of epistaxis which limited anticoagulation options in the past but more recently has tolerated reduced dose Xarelto without any significant bleeding.  She was last seen in November 2018 and since then has done well.  She does not typically experience dyspnea, chest pain, or  palpitations.  She sometimes will note mild dyspnea with higher levels of activity.  At least twice in the past year, she has been out shopping and has had a sudden sensation of weakness without presyncope or syncope.  This typically resolves with sitting in about 5 minutes.  The last episode was several months ago.  She denies PND, orthopnea, dizziness, edema, or early satiety.    Home Medications    Prior to Admission medications   Medication Sig Start Date End Date Taking? Authorizing Provider  acetaminophen (TYLENOL) 500 MG tablet Take 500-1,000 mg by mouth every 6 (six) hours as needed for moderate pain or headache.   Yes [provider]  cetirizine (ZYRTEC) 10 MG tablet Take 10 mg by mouth daily.   Yes [provider]  Cholecalciferol (VITAMIN D3) 1000 units CAPS Take 1,000 Units by mouth daily.   Yes [provider]  diltiazem (CARDIZEM CD) 240 MG 24 hr capsule Take 1 capsule (240 mg total) by mouth daily. 08/20/16  Yes Minna Merritts, MD  doxazosin (CARDURA) 8 MG tablet Take 8 mg by mouth daily.  03/01/16  Yes [provider]  ranitidine (ZANTAC) 150 MG tablet Take 150 mg by mouth daily as needed for heartburn.   Yes [provider]  XARELTO 15 MG TABS tablet TAKE 1 TABLET (15 MG TOTAL) BY MOUTH DAILY WITH SUPPER. NEED APPT W/ DR. Rockey Situ FOR FURTHER REFILLS. 07/24/18  Yes Minna Merritts, MD    Review of Systems  She denies chest pain, palpitations, dyspnea, PND, orthopnea, dizziness, syncope, edema, or early satiety.  All other systems reviewed and are otherwise negative except as noted above.  Physical Exam    VS:  BP 136/60 (BP Location: Left Arm, Patient Position: Sitting, Cuff Size: Normal)   Pulse (!) 55   Ht 5\' 3"  (1.6 m)   Wt 147 lb (66.7 kg)   BMI 26.04 kg/m  , BMI Body mass index is 26.04 kg/m. GEN: Well nourished, well developed, in no acute distress. HEENT: normal. Neck: Supple, no JVD, carotid bruits, or  masses. Cardiac: Irregularly irregular, 2/6 systolic murmur loudest at the lower sternal borders, no rubs, or gallops. No clubbing, cyanosis, edema.  Radials/PT 2+ and equal bilaterally.  Respiratory:  Respirations regular and unlabored, clear to auscultation bilaterally. GI: Soft, nontender, nondistended, BS + x 4. MS: no deformity or atrophy. Skin: warm and dry, no rash. Neuro:  Strength and sensation are intact. Psych: Normal affect.  Accessory Clinical Findings    ECG personally reviewed by me today -atrial fibrillation, 55- no acute changes.  Labs-July 13, 2018  Hemoglobin 12.6, hematocrit 38.1, WBC 6.2, platelets 785 Sodium 139, potassium 4.7, chloride 104, CO2 27.1, BUN 23, creatinine 1.6, glucose 95 GFR 31 Calcium 9.8, albumin 4.1 Total bilirubin 0.5, total protein 7.0, alkaline phosphatase 72, AST 15, ALT 9 Total cholesterol 200, triglycerides 142, HDL 44, LDL 128  Assessment & Plan    1.  Permanent atrial fibrillation: Rate controlled on diltiazem therapy.  She is anticoagulated with renally dosed Xarelto.  Recent labs showed stable CBC and renal function as above.  2.  Essential hypertension: Blood pressure mildly elevated today at 136/60.  Blood pressure has varied over the past week at home between the 120s and 130s with 2 readings in the 140s.  She is currently on diltiazem and doxazosin therapy.  I have asked her to continue to follow her blood pressures daily for the next 2 weeks and contact us if numbers are consistently in the 140s, at which time we can resume a lower dose of hydralazine which she was previously taking (previously on 50 3 times daily, would consider 10 3 times daily).  3.  Weakness: Patient reports at least 2 episodes in the past year where she had sudden onset of weakness while out shopping.  She denies presyncope or syncope.  No associated chest pain or dyspnea.  Symptoms lasted few minutes and then resolve spontaneously.  We discussed the potential  role for event monitoring but given scarcity of symptoms, the yield would likely be low.  I advised that if she has any worsening of the symptoms or more progressive symptoms, we can certainly place a monitor at that time.  She was agreeable with this plan.  4.  Disposition: Follow-up in 1 year or sooner if necessary.   Murray Hodgkins, NP 07/28/2018, 3:20 PM

## 2018-08-01 ENCOUNTER — Other Ambulatory Visit: Payer: Self-pay | Admitting: Cardiovascular Disease

## 2018-08-30 ENCOUNTER — Ambulatory Visit: Payer: Medicare HMO | Admitting: Cardiovascular Disease

## 2018-09-10 ENCOUNTER — Other Ambulatory Visit: Payer: Self-pay | Admitting: Cardiovascular Disease

## 2018-09-10 DIAGNOSIS — I4819 Other persistent atrial fibrillation: Secondary | ICD-10-CM

## 2018-09-11 ENCOUNTER — Other Ambulatory Visit: Payer: Self-pay

## 2018-09-11 DIAGNOSIS — I4819 Other persistent atrial fibrillation: Secondary | ICD-10-CM

## 2018-09-11 NOTE — Telephone Encounter (Signed)
*  STAT* If patient is at the pharmacy, call can be transferred to refill team.   1. Which medications need to be refilled? (please list name of each medication and dose if known) Xarelto  2. Which pharmacy/location (including street and city if local pharmacy) is medication to be sent to? [facility] [place]  3. Do they need a 30 day or 90 day supply? [place]

## 2018-09-11 NOTE — Telephone Encounter (Signed)
Please review for refill. Thanks!  

## 2019-01-16 ENCOUNTER — Other Ambulatory Visit: Payer: Self-pay | Admitting: Family Medicine

## 2019-01-16 DIAGNOSIS — Z1231 Encounter for screening mammogram for malignant neoplasm of breast: Secondary | ICD-10-CM

## 2019-01-30 ENCOUNTER — Inpatient Hospital Stay: Payer: Medicare HMO | Attending: Internal Medicine | Admitting: Internal Medicine

## 2019-01-30 ENCOUNTER — Other Ambulatory Visit: Payer: Self-pay

## 2019-01-30 ENCOUNTER — Encounter: Payer: Self-pay | Admitting: Internal Medicine

## 2019-01-30 ENCOUNTER — Inpatient Hospital Stay: Payer: Medicare HMO

## 2019-01-30 DIAGNOSIS — M109 Gout, unspecified: Secondary | ICD-10-CM | POA: Insufficient documentation

## 2019-01-30 DIAGNOSIS — I482 Chronic atrial fibrillation, unspecified: Secondary | ICD-10-CM

## 2019-01-30 DIAGNOSIS — N183 Chronic kidney disease, stage 3 (moderate): Secondary | ICD-10-CM | POA: Insufficient documentation

## 2019-01-30 DIAGNOSIS — D75839 Thrombocytosis, unspecified: Secondary | ICD-10-CM

## 2019-01-30 DIAGNOSIS — D473 Essential (hemorrhagic) thrombocythemia: Secondary | ICD-10-CM | POA: Diagnosis not present

## 2019-01-30 DIAGNOSIS — E785 Hyperlipidemia, unspecified: Secondary | ICD-10-CM | POA: Diagnosis not present

## 2019-01-30 DIAGNOSIS — Z7901 Long term (current) use of anticoagulants: Secondary | ICD-10-CM | POA: Diagnosis not present

## 2019-01-30 DIAGNOSIS — Z79899 Other long term (current) drug therapy: Secondary | ICD-10-CM | POA: Diagnosis not present

## 2019-01-30 DIAGNOSIS — Z87891 Personal history of nicotine dependence: Secondary | ICD-10-CM | POA: Diagnosis not present

## 2019-01-30 DIAGNOSIS — I129 Hypertensive chronic kidney disease with stage 1 through stage 4 chronic kidney disease, or unspecified chronic kidney disease: Secondary | ICD-10-CM | POA: Diagnosis not present

## 2019-01-30 DIAGNOSIS — R5383 Other fatigue: Secondary | ICD-10-CM | POA: Diagnosis not present

## 2019-01-30 LAB — CBC WITH DIFFERENTIAL/PLATELET
Abs Immature Granulocytes: 0.08 10*3/uL — ABNORMAL HIGH (ref 0.00–0.07)
Basophils Absolute: 0.1 10*3/uL (ref 0.0–0.1)
Basophils Relative: 1 %
Eosinophils Absolute: 0.1 10*3/uL (ref 0.0–0.5)
Eosinophils Relative: 1 %
HCT: 37.3 % (ref 36.0–46.0)
Hemoglobin: 12.2 g/dL (ref 12.0–15.0)
Immature Granulocytes: 1 %
Lymphocytes Relative: 24 %
Lymphs Abs: 2.3 10*3/uL (ref 0.7–4.0)
MCH: 29 pg (ref 26.0–34.0)
MCHC: 32.7 g/dL (ref 30.0–36.0)
MCV: 88.6 fL (ref 80.0–100.0)
Monocytes Absolute: 1.4 10*3/uL — ABNORMAL HIGH (ref 0.1–1.0)
Monocytes Relative: 14 %
Neutro Abs: 5.9 10*3/uL (ref 1.7–7.7)
Neutrophils Relative %: 59 %
Platelets: 882 10*3/uL — ABNORMAL HIGH (ref 150–400)
RBC: 4.21 MIL/uL (ref 3.87–5.11)
RDW: 14.7 % (ref 11.5–15.5)
WBC: 9.8 10*3/uL (ref 4.0–10.5)
nRBC: 0 % (ref 0.0–0.2)

## 2019-01-30 LAB — COMPREHENSIVE METABOLIC PANEL
ALT: 12 U/L (ref 0–44)
AST: 19 U/L (ref 15–41)
Albumin: 4.1 g/dL (ref 3.5–5.0)
Alkaline Phosphatase: 74 U/L (ref 38–126)
Anion gap: 8 (ref 5–15)
BUN: 26 mg/dL — ABNORMAL HIGH (ref 8–23)
CO2: 25 mmol/L (ref 22–32)
Calcium: 9.8 mg/dL (ref 8.9–10.3)
Chloride: 106 mmol/L (ref 98–111)
Creatinine, Ser: 1.38 mg/dL — ABNORMAL HIGH (ref 0.44–1.00)
GFR calc Af Amer: 42 mL/min — ABNORMAL LOW (ref >60)
GFR calc non Af Amer: 36 mL/min — ABNORMAL LOW (ref >60)
Glucose, Bld: 103 mg/dL — ABNORMAL HIGH (ref 70–99)
Potassium: 4.2 mmol/L (ref 3.5–5.1)
Sodium: 139 mmol/L (ref 135–145)
Total Bilirubin: 0.6 mg/dL (ref 0.3–1.2)
Total Protein: 7.8 g/dL (ref 6.5–8.1)

## 2019-01-30 LAB — LACTATE DEHYDROGENASE: LDH: 242 U/L — ABNORMAL HIGH (ref 98–192)

## 2019-01-30 LAB — TECHNOLOGIST SMEAR REVIEW: Plt Morphology: NORMAL

## 2019-01-30 NOTE — Progress Notes (Signed)
[facility] CONSULT NOTE  Patient Care Team: Derinda Late, MD as PCP - General (Family Medicine) Rockey Situ, Kathlene November, MD as PCP - Cardiology (Cardiology) Minna Merritts, MD as Consulting Physician (Cardiology)  CHIEF COMPLAINTS/PURPOSE OF CONSULTATION: Thrombocytosis  HEMATOLOGY HISTORY  #  THROMBOCYTOSIS Paula Clark 2019- 684; July 2020- platelets-992 ; Hb-12 white count- 9]  # A.fib on xarelto [A.fib]  # Chronic Kidney disease-III  HISTORY OF PRESENTING ILLNESS:  Paula Clark 78 y.o.  female pleasant patient with a history of A. fib on Xarelto/chronic kidney disease has been referred to [name] for further evaluation of elevated platelets which was incidentally found on blood work.  Patient denies any significant mucosal bleeding.  Denies any significant easy bruising.  She states that many months ago she had episode of nosebleed for which she had about the emergency room.  Resolved spontaneously.  No falls.  No blood in stools no black or stools.  Complains of Fatigue.  No weight loss.  No nausea no vomiting no early satiety.  Review of Systems  Constitutional: Positive for malaise/fatigue. Negative for chills, diaphoresis, fever and weight loss.  HENT: Negative for nosebleeds and sore throat.   Eyes: Negative for double vision.  Respiratory: Negative for cough, hemoptysis, sputum production, shortness of breath and wheezing.   Cardiovascular: Negative for chest pain, palpitations, orthopnea and leg swelling.  Gastrointestinal: Negative for abdominal pain, blood in stool, constipation, diarrhea, heartburn, melena, nausea and vomiting.  Genitourinary: Negative for dysuria, frequency and urgency.  Musculoskeletal: Negative for back pain and joint pain.  Skin: Negative.  Negative for itching and rash.  Neurological: Negative for dizziness, tingling, focal weakness, weakness and headaches.  Endo/Heme/Allergies: Does not bruise/bleed easily.  Psychiatric/Behavioral:  Negative for depression. The patient is not nervous/anxious and does not have insomnia.      MEDICAL HISTORY:  Past Medical History:  Diagnosis Date  . Chronic atrial fibrillation    a. Unsuccessful DCCV 05/2016; b. CHADS2VASc => 4 (HTN, age x 2, female)--> Xarelto.  . CKD (chronic kidney disease), stage III ([place])   . Epistaxis   . Gout   . History of stress test    a. 04/2016 MV: EF 80%, no ischemia.  Marland Kitchen HLD (hyperlipidemia)   . Hypertension   . Mitral regurgitation    a. Echo 04/2016: EF 60-65%, no RWMA, mild AI, mild MR, mild biatrial enlargement, PASP 32 mmHg, trivial pericardial effusion noted posterior to the heart  . Syncope     SURGICAL HISTORY: Past Surgical History:  Procedure Laterality Date  . APPENDECTOMY    . ELECTROPHYSIOLOGIC STUDY N/A 05/18/2016   Procedure: CARDIOVERSION;  Surgeon: Minna Merritts, MD;  Location: [facility] ORS;  Service: Cardiovascular;  Laterality: N/A;  . TONSILLECTOMY    . TOTAL VAGINAL HYSTERECTOMY      SOCIAL HISTORY: Social History   Socioeconomic History  . Marital status: Widowed    Spouse name: Not on file  . Number of children: Not on file  . Years of education: Not on file  . Highest education level: Not on file  Occupational History  . Not on file  Social Needs  . Financial resource strain: Not on file  . Food insecurity    Worry: Not on file    Inability: Not on file  . Transportation needs    Medical: Not on file    Non-medical: Not on file  Tobacco Use  . Smoking status: Former Research scientist (life sciences)  . Smokeless tobacco: Never Used  Substance  and Sexual Activity  . Alcohol use: No  . Drug use: No  . Sexual activity: Not on file  Lifestyle  . Physical activity    Days per week: Not on file    Minutes per session: Not on file  . Stress: Not on file  Relationships  . Social Herbalist on phone: Not on file    Gets together: Not on file    Attends religious service: Not on file    Active member of club or  organization: Not on file    Attends meetings of clubs or organizations: Not on file    Relationship status: Not on file  . Intimate partner violence    Fear of current or ex partner: Not on file    Emotionally abused: Not on file    Physically abused: Not on file    Forced sexual activity: Not on file  Other Topics Concern  . Not on file  Social History Narrative   [name]; self; remote smoking; No alcohol; worked in [place] retd.     FAMILY HISTORY: Family History  Problem Relation Age of Onset  . Heart failure Mother   . Atrial fibrillation Brother     ALLERGIES:  is allergic to codeine.  MEDICATIONS:  Current Outpatient Medications  Medication Sig Dispense Refill  . acetaminophen (TYLENOL) 500 MG tablet Take 500-1,000 mg by mouth every 6 (six) hours as needed for moderate pain or headache.    . cetirizine (ZYRTEC) 10 MG tablet Take 10 mg by mouth daily.    . Cholecalciferol (VITAMIN D3) 1000 units CAPS Take 1,000 Units by mouth daily.    Marland Kitchen diltiazem (CARDIZEM CD) 240 MG 24 hr capsule Take 1 capsule (240 mg total) by mouth daily. 60 capsule 1  . diltiazem (TIAZAC) 240 MG 24 hr capsule TAKE 1 CAPSULE BY MOUTH EVERY DAY 60 capsule 11  . doxazosin (CARDURA) 8 MG tablet Take 8 mg by mouth daily.     . Rivaroxaban (XARELTO) 15 MG TABS tablet Take 1 tablet (15 mg total) by mouth daily with supper. 90 tablet 1   No current facility-administered medications for this visit.      PHYSICAL EXAMINATION:   Vitals:   01/30/19 1345 01/30/19 1352  BP:  (!) 159/79  Pulse:  73  Resp: 20   Temp: (!) 96.8 F (36 C) (!) 96.8 F (36 C)   Filed Weights   01/30/19 1352  Weight: 145 lb (65.8 kg)    Physical Exam  Constitutional: She is oriented to person, place, and time and well-developed, well-nourished, and in no distress.  HENT:  Head: Normocephalic and atraumatic.  Mouth/Throat: Oropharynx is clear and moist. No oropharyngeal exudate.  Eyes: Pupils are equal,  round, and reactive to light.  Neck: Normal range of motion. Neck supple.  Cardiovascular: Normal rate and regular rhythm.  Pulmonary/Chest: No respiratory distress. She has no wheezes.  Abdominal: Soft. Bowel sounds are normal. She exhibits no distension and no mass. There is no abdominal tenderness. There is no rebound and no guarding.  Musculoskeletal: Normal range of motion.        General: No tenderness or edema.  Neurological: She is alert and oriented to person, place, and time.  Skin: Skin is warm.  Psychiatric: Affect normal.     LABORATORY DATA:  I have reviewed the data as listed Lab Results  Component Value Date   WBC 9.8 01/30/2019   HGB 12.2 01/30/2019   HCT  37.3 01/30/2019   MCV 88.6 01/30/2019   PLT 882 (H) 01/30/2019   Recent Labs    01/30/19 1438  NA 139  K 4.2  CL 106  CO2 25  GLUCOSE 103*  BUN 26*  CREATININE 1.38*  CALCIUM 9.8  GFRNONAA 36*  GFRAA 42*  PROT 7.8  ALBUMIN 4.1  AST 19  ALT 12  ALKPHOS 74  BILITOT 0.6     No results found.  ASSESSMENT & PLAN:   Thrombocytosis ([place]) # Thrombocytosis-most likely essential thrombocytosis versus reactive [clinically less likely]. Patient is asymptomatic.  Long discussion with the patient regarding potential cause of her abnormal blood counts.    # Discussed the patient is at risk for clotting/and bleeding/secondary von Willebrand [especially platelets are 900,000; especially as she is on Xarelto]   # For now I  recommend checking CBC;CMP jak 2 BCR ABL; MPL; CALR mutation on the peripheral blood.LDH;  Check peripheral smear. Also discussed regarding bone marrow biopsy with the above workup is inconclusive; however I would prefer not to do a bone marrow unless absolutely needed.  #Discussed that if patient's platelet count do come high again around 900,000; I would recommend addition of Hydrea; and also stopping temporarily Xarelto to get a better control of the platelet count.  Discussed the  potential side effects of Hydrea.  # A.fib- on xarelto-see plan above/will discuss with cardiology.  #CKD stage III-stable.  # DISPOSITION:# H- [id number]; no cell/ will  # labs today # follow up in 2 weeks-MD- lab- cbc/bmp  Thank you Dr.Baboff for allowing me to participate in the care of your pleasant patient. Please do not hesitate to contact me with questions or concerns in the interim.  Addendum-patient's platelets 882 highly suspicious for myeloproliferative neoplasm.  LDH slightly elevated.  Further work-up still pending.  Recommend Hydrea 500 mg once a day.  Continue Xarelto for now.  Spoke to patient's son Paula Clark.   #Addendum: Left message for the patient regarding the pharmacy.  Unable to reach son other daughter.  # 60 minutes face-to-face with the patient discussing the above plan of care; more than 50% of time spent on prognosis/ natural history; counseling and coordination.   Cc: Drs. Baboff/Golan   ---------------------------------------------------------------------------------------------------------------------------------------------             Cammie Sickle, MD 01/31/2019 9:41 AM

## 2019-01-30 NOTE — Assessment & Plan Note (Addendum)
#  Thrombocytosis-most likely essential thrombocytosis versus reactive [clinically less likely]. Patient is asymptomatic.  Long discussion with the patient regarding potential cause of her abnormal blood counts.    # Discussed the patient is at risk for clotting/and bleeding/secondary von Willebrand [especially platelets are 900,000; especially as she is on Xarelto]   # For now I  recommend checking CBC;CMP jak 2 BCR ABL; MPL; CALR mutation on the peripheral blood.LDH;  Check peripheral smear. Also discussed regarding bone marrow biopsy with the above workup is inconclusive; however I would prefer not to do a bone marrow unless absolutely needed.  #Discussed that if patient's platelet count do come high again around 900,000; I would recommend addition of Hydrea; and also stopping temporarily Xarelto to get a better control of the platelet count.  Discussed the potential side effects of Hydrea.  # A.fib- on xarelto-see plan above/will discuss with cardiology.  #CKD stage III-stable.  # DISPOSITION:# H- [id number]; no cell/ will  # labs today # follow up in 2 weeks-MD- lab- cbc/bmp  Thank you Dr.Baboff for allowing me to participate in the care of your pleasant patient. Please do not hesitate to contact me with questions or concerns in the interim.  Addendum-patient's platelets 882 highly suspicious for myeloproliferative neoplasm.  LDH slightly elevated.  Further work-up still pending.  Recommend Hydrea 500 mg once a day.  Continue Xarelto for now.  Spoke to patient's son Vonna Drafts.   #Addendum: Left message for the patient regarding the pharmacy.  Unable to reach son other daughter.  # 60 minutes face-to-face with the patient discussing the above plan of care; more than 50% of time spent on prognosis/ natural history; counseling and coordination.   Cc: Drs. Baboff/Golan

## 2019-01-31 MED ORDER — HYDROXYUREA 500 MG PO CAPS: 500.0000 mg | ORAL_CAPSULE | Freq: Every day | ORAL | 0 refills | Status: DC

## 2019-02-05 LAB — CALRETICULIN (CALR) MUTATION ANALYSIS

## 2019-02-05 LAB — JAK2 GENOTYPR

## 2019-02-07 LAB — MPL MUTATION ANALYSIS

## 2019-02-08 LAB — BCR-ABL1 FISH
Cells Analyzed: 200
Cells Counted: 200

## 2019-02-12 ENCOUNTER — Other Ambulatory Visit: Payer: Self-pay

## 2019-02-13 ENCOUNTER — Inpatient Hospital Stay: Payer: Medicare HMO | Attending: Internal Medicine | Admitting: Internal Medicine

## 2019-02-13 ENCOUNTER — Other Ambulatory Visit: Payer: Self-pay

## 2019-02-13 ENCOUNTER — Telehealth: Payer: Self-pay | Admitting: Internal Medicine

## 2019-02-13 ENCOUNTER — Inpatient Hospital Stay: Payer: Medicare HMO

## 2019-02-13 VITALS — BP 147/68 | HR 61 | Temp 97.1°F | Resp 18 | Wt 147.0 lb

## 2019-02-13 DIAGNOSIS — N183 Chronic kidney disease, stage 3 (moderate): Secondary | ICD-10-CM | POA: Insufficient documentation

## 2019-02-13 DIAGNOSIS — Z79899 Other long term (current) drug therapy: Secondary | ICD-10-CM | POA: Insufficient documentation

## 2019-02-13 DIAGNOSIS — R5381 Other malaise: Secondary | ICD-10-CM | POA: Insufficient documentation

## 2019-02-13 DIAGNOSIS — D75839 Thrombocytosis, unspecified: Secondary | ICD-10-CM

## 2019-02-13 DIAGNOSIS — M109 Gout, unspecified: Secondary | ICD-10-CM | POA: Diagnosis not present

## 2019-02-13 DIAGNOSIS — D473 Essential (hemorrhagic) thrombocythemia: Secondary | ICD-10-CM | POA: Insufficient documentation

## 2019-02-13 DIAGNOSIS — I482 Chronic atrial fibrillation, unspecified: Secondary | ICD-10-CM | POA: Insufficient documentation

## 2019-02-13 DIAGNOSIS — Z87891 Personal history of nicotine dependence: Secondary | ICD-10-CM | POA: Insufficient documentation

## 2019-02-13 DIAGNOSIS — E785 Hyperlipidemia, unspecified: Secondary | ICD-10-CM | POA: Insufficient documentation

## 2019-02-13 DIAGNOSIS — R5383 Other fatigue: Secondary | ICD-10-CM | POA: Insufficient documentation

## 2019-02-13 DIAGNOSIS — Z7901 Long term (current) use of anticoagulants: Secondary | ICD-10-CM | POA: Diagnosis not present

## 2019-02-13 DIAGNOSIS — M25562 Pain in left knee: Secondary | ICD-10-CM | POA: Insufficient documentation

## 2019-02-13 DIAGNOSIS — I129 Hypertensive chronic kidney disease with stage 1 through stage 4 chronic kidney disease, or unspecified chronic kidney disease: Secondary | ICD-10-CM | POA: Diagnosis not present

## 2019-02-13 LAB — BASIC METABOLIC PANEL
Anion gap: 8 (ref 5–15)
BUN: 17 mg/dL (ref 8–23)
CO2: 24 mmol/L (ref 22–32)
Calcium: 9.3 mg/dL (ref 8.9–10.3)
Chloride: 106 mmol/L (ref 98–111)
Creatinine, Ser: 1.4 mg/dL — ABNORMAL HIGH (ref 0.44–1.00)
GFR calc Af Amer: 41 mL/min — ABNORMAL LOW (ref >60)
GFR calc non Af Amer: 35 mL/min — ABNORMAL LOW (ref >60)
Glucose, Bld: 125 mg/dL — ABNORMAL HIGH (ref 70–99)
Potassium: 3.9 mmol/L (ref 3.5–5.1)
Sodium: 138 mmol/L (ref 135–145)

## 2019-02-13 LAB — CBC WITH DIFFERENTIAL/PLATELET
Abs Immature Granulocytes: 0.03 10*3/uL (ref 0.00–0.07)
Basophils Absolute: 0 10*3/uL (ref 0.0–0.1)
Basophils Relative: 1 %
Eosinophils Absolute: 0.1 10*3/uL (ref 0.0–0.5)
Eosinophils Relative: 2 %
HCT: 35 % — ABNORMAL LOW (ref 36.0–46.0)
Hemoglobin: 11.5 g/dL — ABNORMAL LOW (ref 12.0–15.0)
Immature Granulocytes: 1 %
Lymphocytes Relative: 30 %
Lymphs Abs: 1.9 10*3/uL (ref 0.7–4.0)
MCH: 29.3 pg (ref 26.0–34.0)
MCHC: 32.9 g/dL (ref 30.0–36.0)
MCV: 89.1 fL (ref 80.0–100.0)
Monocytes Absolute: 0.7 10*3/uL (ref 0.1–1.0)
Monocytes Relative: 11 %
Neutro Abs: 3.7 10*3/uL (ref 1.7–7.7)
Neutrophils Relative %: 55 %
Platelets: 595 10*3/uL — ABNORMAL HIGH (ref 150–400)
RBC: 3.93 MIL/uL (ref 3.87–5.11)
RDW: 15 % (ref 11.5–15.5)
WBC: 6.5 10*3/uL (ref 4.0–10.5)
nRBC: 0 % (ref 0.0–0.2)

## 2019-02-13 MED ORDER — HYDROXYUREA 500 MG PO CAPS: 500.0000 mg | ORAL_CAPSULE | Freq: Every day | ORAL | 1 refills | Status: DC

## 2019-02-13 NOTE — Assessment & Plan Note (Addendum)
#  Essential thrombocytosis-CALR positive; Intermediate risk given her age.  I discussed the pathology/diagnosis of essential thrombocytosis in detail with the patient.  This is considered to be malignancy; however usually life expectancy should not be altered because of this diagnosis.   Patient already on Xarelto.  Recommend Hydrea 500 mg once a day to control the platelets. Patient tolerating Hydrea fairly well.  No major side effects noted.  # A.fib- on xarelto; hold off aspirin.  #CKD stage III-stable.  #I will reach patient's son to discuss the above.  # DISPOSITION:  # labs today-cbc/bmp # follow up in 6 weeks-MD-labs; cbc/cmp- Dr.B  Cc: Drs. Baboff/Golan

## 2019-02-13 NOTE — Progress Notes (Signed)
Patient does not offer any problems today and is here to discuss lab results.

## 2019-02-13 NOTE — Telephone Encounter (Signed)
Left a message for patient's son Bud Ellenbecker-to update on his mom's diagnosis/progress.

## 2019-02-13 NOTE — Progress Notes (Signed)
[facility] CONSULT NOTE  Patient Care Team: Derinda Late, MD as PCP - General (Family Medicine) Rockey Situ Kathlene November, MD as PCP - Cardiology (Cardiology) Minna Merritts, MD as Consulting Physician (Cardiology)  CHIEF COMPLAINTS/PURPOSE OF CONSULTATION: Thrombocytosis  Oncology History Overview Note  # ESSENTIAL THROMBOCYTOSIS [[June 2019- 462; July 2020- platelets-992 ; Hb-12 white count- 9];July 2020-; CALR MUTATION POSITIVE; July 2020- Hydrea 500 mg/day.   # A.fib on xarelto [A.fib]  # Chronic Kidney disease-III   Essential thrombocytosis ([place])  01/30/2019 Initial Diagnosis   Essential thrombocytosis (HCC)       HISTORY OF PRESENTING ILLNESS:  Paula Clark 78 y.o.  female pleasant patient with a history of A. fib on Xarelto/chronic kidney disease-is here for follow-up to discuss results of blood work.   Patient was last evaluated approximately 2 weeks ago for elevated platelets.  Patient was started on Hydrea given platelets of 800 900 given the concerns of essential thrombocytosis.  Patient has been taking Hydrea 500 mg once a day.  Denies having any diarrhea.  Denies any nausea vomiting.  Complains of mild joint pains-left knee pain.  No falls.   Review of Systems  Constitutional: Positive for malaise/fatigue. Negative for chills, diaphoresis, fever and weight loss.  HENT: Negative for nosebleeds and sore throat.   Eyes: Negative for double vision.  Respiratory: Negative for cough, hemoptysis, sputum production, shortness of breath and wheezing.   Cardiovascular: Negative for chest pain, palpitations, orthopnea and leg swelling.  Gastrointestinal: Negative for abdominal pain, blood in stool, constipation, diarrhea, heartburn, melena, nausea and vomiting.  Genitourinary: Negative for dysuria, frequency and urgency.  Musculoskeletal: Positive for joint pain. Negative for back pain.  Skin: Negative.  Negative for itching and rash.  Neurological: Negative  for dizziness, tingling, focal weakness, weakness and headaches.  Endo/Heme/Allergies: Does not bruise/bleed easily.  Psychiatric/Behavioral: Negative for depression. The patient is not nervous/anxious and does not have insomnia.      MEDICAL HISTORY:  Past Medical History:  Diagnosis Date  . Chronic atrial fibrillation    a. Unsuccessful DCCV 05/2016; b. CHADS2VASc => 4 (HTN, age x 2, female)--> Xarelto.  . CKD (chronic kidney disease), stage III ([place])   . Epistaxis   . Gout   . History of stress test    a. 04/2016 MV: EF 80%, no ischemia.  Marland Kitchen HLD (hyperlipidemia)   . Hypertension   . Mitral regurgitation    a. Echo 04/2016: EF 60-65%, no RWMA, mild AI, mild MR, mild biatrial enlargement, PASP 32 mmHg, trivial pericardial effusion noted posterior to the heart  . Syncope     SURGICAL HISTORY: Past Surgical History:  Procedure Laterality Date  . APPENDECTOMY    . ELECTROPHYSIOLOGIC STUDY N/A 05/18/2016   Procedure: CARDIOVERSION;  Surgeon: Minna Merritts, MD;  Location: [facility] ORS;  Service: Cardiovascular;  Laterality: N/A;  . TONSILLECTOMY    . TOTAL VAGINAL HYSTERECTOMY      SOCIAL HISTORY: Social History   Socioeconomic History  . Marital status: Widowed    Spouse name: Not on file  . Number of children: Not on file  . Years of education: Not on file  . Highest education level: Not on file  Occupational History  . Not on file  Social Needs  . Financial resource strain: Not on file  . Food insecurity    Worry: Not on file    Inability: Not on file  . Transportation needs    Medical: Not on file  Non-medical: Not on file  Tobacco Use  . Smoking status: Former Research scientist (life sciences)  . Smokeless tobacco: Never Used  Substance and Sexual Activity  . Alcohol use: No  . Drug use: No  . Sexual activity: Not on file  Lifestyle  . Physical activity    Days per week: Not on file    Minutes per session: Not on file  . Stress: Not on file  Relationships  . Social Product manager on phone: Not on file    Gets together: Not on file    Attends religious service: Not on file    Active member of club or organization: Not on file    Attends meetings of clubs or organizations: Not on file    Relationship status: Not on file  . Intimate partner violence    Fear of current or ex partner: Not on file    Emotionally abused: Not on file    Physically abused: Not on file    Forced sexual activity: Not on file  Other Topics Concern  . Not on file  Social History Narrative   [name]; self; remote smoking; No alcohol; worked in [place] retd.     FAMILY HISTORY: Family History  Problem Relation Age of Onset  . Heart failure Mother   . Atrial fibrillation Brother     ALLERGIES:  is allergic to codeine.  MEDICATIONS:  Current Outpatient Medications  Medication Sig Dispense Refill  . acetaminophen (TYLENOL) 500 MG tablet Take 500-1,000 mg by mouth every 6 (six) hours as needed for moderate pain or headache.    . cetirizine (ZYRTEC) 10 MG tablet Take 10 mg by mouth daily.    . Cholecalciferol (VITAMIN D3) 1000 units CAPS Take 1,000 Units by mouth daily.    Marland Kitchen diltiazem (CARDIZEM CD) 240 MG 24 hr capsule Take 1 capsule (240 mg total) by mouth daily. 60 capsule 1  . doxazosin (CARDURA) 8 MG tablet Take 8 mg by mouth daily.     . hydroxyurea (HYDREA) 500 MG capsule Take 1 capsule (500 mg total) by mouth daily. May take with food to minimize GI side effects. 30 capsule 1  . Rivaroxaban (XARELTO) 15 MG TABS tablet Take 1 tablet (15 mg total) by mouth daily with supper. 90 tablet 1  . diltiazem (TIAZAC) 240 MG 24 hr capsule TAKE 1 CAPSULE BY MOUTH EVERY DAY (Patient not taking: Reported on 02/13/2019) 60 capsule 11   No current facility-administered medications for this visit.      PHYSICAL EXAMINATION:   Vitals:   02/13/19 1412  BP: (!) 147/68  Pulse: 61  Resp: 18  Temp: (!) 97.1 F (36.2 C)   Filed Weights   02/13/19 1412  Weight: 147 lb (66.7  kg)    Physical Exam  Constitutional: She is oriented to person, place, and time and well-developed, well-nourished, and in no distress.  HENT:  Head: Normocephalic and atraumatic.  Mouth/Throat: Oropharynx is clear and moist. No oropharyngeal exudate.  Eyes: Pupils are equal, round, and reactive to light.  Neck: Normal range of motion. Neck supple.  Cardiovascular: Normal rate and regular rhythm.  Pulmonary/Chest: Effort normal and breath sounds normal. No respiratory distress. She has no wheezes.  Abdominal: Soft. Bowel sounds are normal. She exhibits no distension and no mass. There is no abdominal tenderness. There is no rebound and no guarding.  Musculoskeletal: Normal range of motion.        General: No tenderness or edema.  Neurological: She  is alert and oriented to person, place, and time.  Skin: Skin is warm.  Psychiatric: Affect normal.     LABORATORY DATA:  I have reviewed the data as listed Lab Results  Component Value Date   WBC 9.8 01/30/2019   HGB 12.2 01/30/2019   HCT 37.3 01/30/2019   MCV 88.6 01/30/2019   PLT 882 (H) 01/30/2019   Recent Labs    01/30/19 1438  NA 139  K 4.2  CL 106  CO2 25  GLUCOSE 103*  BUN 26*  CREATININE 1.38*  CALCIUM 9.8  GFRNONAA 36*  GFRAA 42*  PROT 7.8  ALBUMIN 4.1  AST 19  ALT 12  ALKPHOS 74  BILITOT 0.6     No results found.  ASSESSMENT & PLAN:   Essential thrombocytosis (HCC) #Essential thrombocytosis-CALR positive; high risk given her age.  I discussed the pathology/diagnosis of essential thrombocytosis in detail with the patient.  This is considered to be malignancy; however usually life expectancy should not be altered because of this diagnosis.   Patient already on Xarelto.  Recommend Hydrea 500 mg once a day to control the platelets. Patient tolerating Hydrea fairly well.  No major side effects noted.  # A.fib- on xarelto; hold off aspirin.  #CKD stage III-stable.  #I will reach patient's son to  discuss the above.  # DISPOSITION:  # labs today-cbc/bmp # follow up in 6 weeks-MD-labs; cbc/cmp- Dr.B  Cc: Drs. Baboff/Golan    Cammie Sickle, MD 02/13/2019 2:55 PM

## 2019-02-22 ENCOUNTER — Other Ambulatory Visit: Payer: Self-pay | Admitting: Internal Medicine

## 2019-02-22 NOTE — Telephone Encounter (Signed)
Basic metabolic panel Order: [id number] Status:  Final result Visible to patient:  Yes (MyChart) Next appt:  02/23/2019 at 02:20 PM in Radiology ([facility]) Dx:  Thrombocytosis ([place])  Ref Range & Units 9d ago 3wk ago 18yr ago  Sodium 135 - 145 mmol/L 138  139  140 R   Potassium 3.5 - 5.1 mmol/L 3.9  4.2  3.9 R   Chloride 98 - 111 mmol/L 106  106  99 R   CO2 22 - 32 mmol/L 24  25  25  R   Glucose, Bld 70 - 99 mg/dL 125High   103High   121High  R   BUN 8 - 23 mg/dL 17  26High   21 R   Creatinine, Ser 0.44 - 1.00 mg/dL 1.40High   1.38High   1.37High  R   Calcium 8.9 - 10.3 mg/dL 9.3  9.8  9.6 R   GFR calc non Af Amer >60 mL/min 35Low   36Low   37Low  R   GFR calc Af Amer >60 mL/min 41Low   42Low   43Low  R   Anion gap 5 - 15 8  8  CM    Comment: Performed at [facility], [address] [place], [place] [address]  Resulting Agency  [facility] CLIN LAB [facility]      Specimen Collected: 02/13/19 14:50 Last Resulted: 02/13/19 15:11     Lab Flowsheet   Order Details   View Encounter   Lab and Collection Details   Routing   Result History     CM=Additional commentsR=Reference range differs from displayed range      Other Results from 02/13/2019  Contains abnormal data CBC with Differential Order: [id number]  Status:  Final result Visible to patient:  Yes (MyChart) Next appt:  02/23/2019 at 02:20 PM in Radiology ([facility]) Dx:  Thrombocytosis ([place])  Ref Range & Units 9d ago 3wk ago 73yr ago  WBC 4.0 - 10.5 K/uL 6.5  9.8  10.9High  R   RBC 3.87 - 5.11 MIL/uL 3.93  4.21  4.02 R, CM   Hemoglobin 12.0 - 15.0 g/dL 11.5Low   12.2  12.0 R   HCT 36.0 - 46.0 % 35.0Low   37.3  36.2 R   MCV 80.0 - 100.0 fL 89.1  88.6  90 R   MCH 26.0 - 34.0 pg 29.3  29.0  29.9 R   MCHC 30.0 - 36.0 g/dL 32.9  32.7  33.1 R   RDW 11.5 - 15.5 % 15.0  14.7  14.8 R   Platelets 150 - 400 K/uL 595High   882High   498High  R   nRBC 0.0 - 0.2 % 0.0  0.0    Neutrophils Relative % % 55  59     Neutro Abs 1.7 - 7.7 K/uL 3.7  5.9    Lymphocytes Relative % 30  24    Lymphs Abs 0.7 - 4.0 K/uL 1.9  2.3    Monocytes Relative % 11  14    Monocytes Absolute 0.1 - 1.0 K/uL 0.7  1.4High     Eosinophils Relative % 2  1    Eosinophils Absolute 0.0 - 0.5 K/uL 0.1  0.1    Basophils Relative % 1  1    Basophils Absolute 0.0 - 0.1 K/uL 0.0  0.1    Immature Granulocytes % 1  1    Abs Immature Granulocytes 0.00 - 0.07 K/uL 0.03  0.08High  CM  Comment: Performed at [facility], [address], [place], [place] [address]  Hematology Comments:    Note: CM   Resulting Agency  [facility] CLIN LAB [facility]      Specimen Collected: 02/13/19 14:50 Last Resulted: 02/13/19 15:02

## 2019-02-23 ENCOUNTER — Ambulatory Visit
Admission: RE | Admit: 2019-02-23 | Discharge: 2019-02-23 | Disposition: A | Discharge status: Home / Self Care | Payer: Medicare HMO | Source: Ambulatory Visit | Attending: Family Medicine | Admitting: Family Medicine

## 2019-02-23 ENCOUNTER — Other Ambulatory Visit: Payer: Self-pay

## 2019-02-23 DIAGNOSIS — Z1231 Encounter for screening mammogram for malignant neoplasm of breast: Secondary | ICD-10-CM | POA: Insufficient documentation

## 2019-03-10 ENCOUNTER — Other Ambulatory Visit: Payer: Self-pay | Admitting: Cardiovascular Disease

## 2019-03-10 DIAGNOSIS — I4819 Other persistent atrial fibrillation: Secondary | ICD-10-CM

## 2019-03-12 NOTE — Telephone Encounter (Signed)
Pt's age 78, wt 66.7 kg, SCr 1.4, CrCl 33.75, last ov w/ CB 07/28/18.

## 2019-03-12 NOTE — Telephone Encounter (Signed)
Please review for refill. Thanks!  

## 2019-03-22 ENCOUNTER — Other Ambulatory Visit: Payer: Self-pay | Admitting: Internal Medicine

## 2019-03-22 NOTE — Telephone Encounter (Signed)
Contains abnormal data Basic metabolic panel Order: [id number] Status:  Final result Visible to patient:  Yes (MyChart) Next appt:  03/27/2019 at 02:30 PM in Oncology ([facility]) Dx:  Thrombocytosis ([place])  Ref Range & Units 73mo ago (02/13/19) 51mo ago (01/30/19) 81yr ago (05/06/16)  Sodium 135 - 145 mmol/L 138  139  140 R   Potassium 3.5 - 5.1 mmol/L 3.9  4.2  3.9 R   Chloride 98 - 111 mmol/L 106  106  99 R   CO2 22 - 32 mmol/L 24  25  25  R   Glucose, Bld 70 - 99 mg/dL 125High   103High   121High  R   BUN 8 - 23 mg/dL 17  26High   21 R   Creatinine, Ser 0.44 - 1.00 mg/dL 1.40High   1.38High   1.37High  R   Calcium 8.9 - 10.3 mg/dL 9.3  9.8  9.6 R   GFR calc non Af Amer >60 mL/min 35Low   36Low   37Low  R   GFR calc Af Amer >60 mL/min 41Low   42Low   43Low  R   Anion gap 5 - 15 8  8  CM    Comment: Performed at [facility], [address] [place], [place] [address]  Resulting Agency  [facility] CLIN LAB [facility]      Specimen Collected: 02/13/19 14:50 Last Resulted: 02/13/19 15:11     Lab Flowsheet   Order Details   View Encounter   Lab and Collection Details   Routing   Result History     CM=Additional commentsR=Reference range differs from displayed range      Other Results from 02/13/2019  Contains abnormal data CBC with Differential Order: [id number]  Status:  Final result Visible to patient:  Yes (MyChart) Next appt:  03/27/2019 at 02:30 PM in Oncology ([facility]) Dx:  Thrombocytosis ([place])  Ref Range & Units 90mo ago (02/13/19) 75mo ago (01/30/19) 89yr ago (05/06/16)  WBC 4.0 - 10.5 K/uL 6.5  9.8  10.9High  R   RBC 3.87 - 5.11 MIL/uL 3.93  4.21  4.02 R, CM   Hemoglobin 12.0 - 15.0 g/dL 11.5Low   12.2  12.0 R   HCT 36.0 - 46.0 % 35.0Low   37.3  36.2 R   MCV 80.0 - 100.0 fL 89.1  88.6  90 R   MCH 26.0 - 34.0 pg 29.3  29.0  29.9 R   MCHC 30.0 - 36.0 g/dL 32.9  32.7  33.1 R   RDW 11.5 - 15.5 % 15.0  14.7  14.8 R   Platelets 150 - 400 K/uL 595High    882High   498High  R   nRBC 0.0 - 0.2 % 0.0  0.0    Neutrophils Relative % % 55  59    Neutro Abs 1.7 - 7.7 K/uL 3.7  5.9    Lymphocytes Relative % 30  24    Lymphs Abs 0.7 - 4.0 K/uL 1.9  2.3    Monocytes Relative % 11  14    Monocytes Absolute 0.1 - 1.0 K/uL 0.7  1.4High     Eosinophils Relative % 2  1    Eosinophils Absolute 0.0 - 0.5 K/uL 0.1  0.1    Basophils Relative % 1  1    Basophils Absolute 0.0 - 0.1 K/uL 0.0  0.1    Immature Granulocytes % 1  1    Abs Immature Granulocytes 0.00 - 0.07  K/uL 0.03  0.Britannicus.Andrea  CM    Comment: Performed at [facility], [address], [place], [place] [address]  Hematology Comments:    Note: CM   Resulting Agency  [facility] CLIN LAB [facility]      Specimen Collected: 02/13/19 14:50 Last Resulted: 02/13/19 15:02

## 2019-03-27 ENCOUNTER — Encounter: Payer: Self-pay | Admitting: Internal Medicine

## 2019-03-27 ENCOUNTER — Other Ambulatory Visit: Payer: Self-pay

## 2019-03-27 ENCOUNTER — Inpatient Hospital Stay: Payer: Medicare HMO | Attending: Internal Medicine

## 2019-03-27 ENCOUNTER — Inpatient Hospital Stay (HOSPITAL_BASED_OUTPATIENT_CLINIC_OR_DEPARTMENT_OTHER): Payer: Medicare HMO | Admitting: Internal Medicine

## 2019-03-27 DIAGNOSIS — I482 Chronic atrial fibrillation, unspecified: Secondary | ICD-10-CM | POA: Insufficient documentation

## 2019-03-27 DIAGNOSIS — R197 Diarrhea, unspecified: Secondary | ICD-10-CM | POA: Insufficient documentation

## 2019-03-27 DIAGNOSIS — D473 Essential (hemorrhagic) thrombocythemia: Secondary | ICD-10-CM

## 2019-03-27 DIAGNOSIS — Z90710 Acquired absence of both cervix and uterus: Secondary | ICD-10-CM | POA: Insufficient documentation

## 2019-03-27 DIAGNOSIS — I10 Essential (primary) hypertension: Secondary | ICD-10-CM | POA: Insufficient documentation

## 2019-03-27 DIAGNOSIS — E785 Hyperlipidemia, unspecified: Secondary | ICD-10-CM | POA: Diagnosis not present

## 2019-03-27 DIAGNOSIS — N183 Chronic kidney disease, stage 3 (moderate): Secondary | ICD-10-CM | POA: Insufficient documentation

## 2019-03-27 DIAGNOSIS — D75839 Thrombocytosis, unspecified: Secondary | ICD-10-CM

## 2019-03-27 DIAGNOSIS — Z7901 Long term (current) use of anticoagulants: Secondary | ICD-10-CM | POA: Insufficient documentation

## 2019-03-27 DIAGNOSIS — Z79899 Other long term (current) drug therapy: Secondary | ICD-10-CM | POA: Insufficient documentation

## 2019-03-27 DIAGNOSIS — Z87891 Personal history of nicotine dependence: Secondary | ICD-10-CM | POA: Insufficient documentation

## 2019-03-27 LAB — CBC WITH DIFFERENTIAL/PLATELET
Abs Immature Granulocytes: 0.04 10*3/uL (ref 0.00–0.07)
Basophils Absolute: 0 10*3/uL (ref 0.0–0.1)
Basophils Relative: 1 %
Eosinophils Absolute: 0.1 10*3/uL (ref 0.0–0.5)
Eosinophils Relative: 1 %
HCT: 34.1 % — ABNORMAL LOW (ref 36.0–46.0)
Hemoglobin: 11.4 g/dL — ABNORMAL LOW (ref 12.0–15.0)
Immature Granulocytes: 1 %
Lymphocytes Relative: 19 %
Lymphs Abs: 1.2 10*3/uL (ref 0.7–4.0)
MCH: 32.2 pg (ref 26.0–34.0)
MCHC: 33.4 g/dL (ref 30.0–36.0)
MCV: 96.3 fL (ref 80.0–100.0)
Monocytes Absolute: 0.7 10*3/uL (ref 0.1–1.0)
Monocytes Relative: 11 %
Neutro Abs: 4.2 10*3/uL (ref 1.7–7.7)
Neutrophils Relative %: 67 %
Platelets: 385 10*3/uL (ref 150–400)
RBC: 3.54 MIL/uL — ABNORMAL LOW (ref 3.87–5.11)
RDW: 21.3 % — ABNORMAL HIGH (ref 11.5–15.5)
WBC: 6.2 10*3/uL (ref 4.0–10.5)
nRBC: 0 % (ref 0.0–0.2)

## 2019-03-27 LAB — COMPREHENSIVE METABOLIC PANEL
ALT: 11 U/L (ref 0–44)
AST: 19 U/L (ref 15–41)
Albumin: 4 g/dL (ref 3.5–5.0)
Alkaline Phosphatase: 66 U/L (ref 38–126)
Anion gap: 10 (ref 5–15)
BUN: 25 mg/dL — ABNORMAL HIGH (ref 8–23)
CO2: 24 mmol/L (ref 22–32)
Calcium: 9.5 mg/dL (ref 8.9–10.3)
Chloride: 104 mmol/L (ref 98–111)
Creatinine, Ser: 1.35 mg/dL — ABNORMAL HIGH (ref 0.44–1.00)
GFR calc Af Amer: 43 mL/min — ABNORMAL LOW (ref >60)
GFR calc non Af Amer: 37 mL/min — ABNORMAL LOW (ref >60)
Glucose, Bld: 130 mg/dL — ABNORMAL HIGH (ref 70–99)
Potassium: 4.2 mmol/L (ref 3.5–5.1)
Sodium: 138 mmol/L (ref 135–145)
Total Bilirubin: 0.6 mg/dL (ref 0.3–1.2)
Total Protein: 7.2 g/dL (ref 6.5–8.1)

## 2019-03-27 NOTE — Progress Notes (Signed)
[place] CONSULT NOTE  Patient Care Team: Derinda Late, MD as PCP - General (Family Medicine) Rockey Situ Kathlene November, MD as PCP - Cardiology (Cardiology) Minna Merritts, MD as Consulting Physician (Cardiology)  CHIEF COMPLAINTS/PURPOSE OF CONSULTATION: Thrombocytosis  Oncology History Overview Note  # ESSENTIAL THROMBOCYTOSIS [[June 2019- [id number]; July 2020- platelets-992 ; Hb-12 white count- 9];INTERMEDIATE RISK;July 2020-; CALR MUTATION POSITIVE; July 2020- Hydrea 500 mg/day.   # A.fib on xarelto [A.fib]  # Chronic Kidney disease-III   Essential thrombocytosis ([place])  01/30/2019 Initial Diagnosis   Essential thrombocytosis (HCC)       HISTORY OF PRESENTING ILLNESS:  Paula Clark 78 y.o.  female pleasant patient with a history of A. fib on Xarelto/chronic kidney disease-; recently diagnosed essential thrombocytosis currently on Hydrea is here for follow-up.  Patient's appetite is good.  No weight loss.  No nausea or vomiting.  Patient complains of diarrhea loose stools intermittently 1-3 a day; 2-3 times a week.  Denies any blood in stools.  She has been keeping herself hydrated.  No dizziness or falls.  Review of Systems  Constitutional: Positive for malaise/fatigue. Negative for chills, diaphoresis, fever and weight loss.  HENT: Negative for nosebleeds and sore throat.   Eyes: Negative for double vision.  Respiratory: Negative for cough, hemoptysis, sputum production, shortness of breath and wheezing.   Cardiovascular: Negative for chest pain, palpitations, orthopnea and leg swelling.  Gastrointestinal: Negative for abdominal pain, blood in stool, constipation, diarrhea, heartburn, melena, nausea and vomiting.  Genitourinary: Negative for dysuria, frequency and urgency.  Musculoskeletal: Positive for joint pain. Negative for back pain.  Skin: Negative.  Negative for itching and rash.  Neurological: Negative for dizziness, tingling, focal weakness, weakness  and headaches.  Endo/Heme/Allergies: Does not bruise/bleed easily.  Psychiatric/Behavioral: Negative for depression. The patient is not nervous/anxious and does not have insomnia.      MEDICAL HISTORY:  Past Medical History:  Diagnosis Date  . Chronic atrial fibrillation    a. Unsuccessful DCCV 05/2016; b. CHADS2VASc => 4 (HTN, age x 2, female)--> Xarelto.  . CKD (chronic kidney disease), stage III ([place])   . Epistaxis   . Gout   . History of stress test    a. 04/2016 MV: EF 80%, no ischemia.  Marland Kitchen HLD (hyperlipidemia)   . Hypertension   . Mitral regurgitation    a. Echo 04/2016: EF 60-65%, no RWMA, mild AI, mild MR, mild biatrial enlargement, PASP 32 mmHg, trivial pericardial effusion noted posterior to the heart  . Syncope     SURGICAL HISTORY: Past Surgical History:  Procedure Laterality Date  . ABDOMINAL HYSTERECTOMY    . APPENDECTOMY    . ELECTROPHYSIOLOGIC STUDY N/A 05/18/2016   Procedure: CARDIOVERSION;  Surgeon: Minna Merritts, MD;  Location: [facility] ORS;  Service: Cardiovascular;  Laterality: N/A;  . OOPHORECTOMY    . TONSILLECTOMY    . TOTAL VAGINAL HYSTERECTOMY      SOCIAL HISTORY: Social History   Socioeconomic History  . Marital status: Widowed    Spouse name: Not on file  . Number of children: Not on file  . Years of education: Not on file  . Highest education level: Not on file  Occupational History  . Not on file  Social Needs  . Financial resource strain: Not on file  . Food insecurity    Worry: Not on file    Inability: Not on file  . Transportation needs    Medical: Not on file    Non-medical:  Not on file  Tobacco Use  . Smoking status: Former Research scientist (life sciences)  . Smokeless tobacco: Never Used  Substance and Sexual Activity  . Alcohol use: No  . Drug use: No  . Sexual activity: Not on file  Lifestyle  . Physical activity    Days per week: Not on file    Minutes per session: Not on file  . Stress: Not on file  Relationships  . Social Product manager on phone: Not on file    Gets together: Not on file    Attends religious service: Not on file    Active member of club or organization: Not on file    Attends meetings of clubs or organizations: Not on file    Relationship status: Not on file  . Intimate partner violence    Fear of current or ex partner: Not on file    Emotionally abused: Not on file    Physically abused: Not on file    Forced sexual activity: Not on file  Other Topics Concern  . Not on file  Social History Narrative   [name]; self; remote smoking; No alcohol; worked in [place] retd.     FAMILY HISTORY: Family History  Problem Relation Age of Onset  . Heart failure Mother   . Atrial fibrillation Brother     ALLERGIES:  is allergic to codeine.  MEDICATIONS:  Current Outpatient Medications  Medication Sig Dispense Refill  . acetaminophen (TYLENOL) 500 MG tablet Take 500-1,000 mg by mouth every 6 (six) hours as needed for moderate pain or headache.    . cetirizine (ZYRTEC) 10 MG tablet Take 10 mg by mouth daily.    . Cholecalciferol (VITAMIN D3) 1000 units CAPS Take 1,000 Units by mouth daily.    . Cholecalciferol-Vitamin C (VITAMIN D3-VITAMIN C) 1000-500 UNIT-MG CAPS Take 1,000 Units by mouth.    . diltiazem (CARDIZEM CD) 240 MG 24 hr capsule Take 1 capsule (240 mg total) by mouth daily. 60 capsule 1  . doxazosin (CARDURA) 8 MG tablet Take 8 mg by mouth daily.     . hydroxyurea (HYDREA) 500 MG capsule TAKE 1 CAPSULE (500 MG TOTAL) BY MOUTH DAILY. MAY TAKE WITH FOOD TO MINIMIZE GI SIDE EFFECTS. 30 capsule 1  . loperamide (IMODIUM A-D) 2 MG capsule Take 2 mg by mouth as needed for diarrhea or loose stools.    Alveda Reasons 15 MG TABS tablet TAKE 1 TABLET (15 MG TOTAL) BY MOUTH DAILY WITH SUPPER. 90 tablet 1   No current facility-administered medications for this visit.      PHYSICAL EXAMINATION:   Vitals:   03/27/19 1501  BP: 134/64  Pulse: (!) 50  Resp: 18  Temp: (!) 96.2 F (35.7 C)   SpO2: 98%   Filed Weights   03/27/19 1501  Weight: 146 lb 9.6 oz (66.5 kg)    Physical Exam  Constitutional: She is oriented to person, place, and time and well-developed, well-nourished, and in no distress.  HENT:  Head: Normocephalic and atraumatic.  Mouth/Throat: Oropharynx is clear and moist. No oropharyngeal exudate.  Eyes: Pupils are equal, round, and reactive to light.  Neck: Normal range of motion. Neck supple.  Cardiovascular: Normal rate and regular rhythm.  Pulmonary/Chest: Effort normal and breath sounds normal. No respiratory distress. She has no wheezes.  Abdominal: Soft. Bowel sounds are normal. She exhibits no distension and no mass. There is no abdominal tenderness. There is no rebound and no guarding.  Musculoskeletal: Normal range  of motion.        General: No tenderness or edema.  Neurological: She is alert and oriented to person, place, and time.  Skin: Skin is warm.  Psychiatric: Affect normal.     LABORATORY DATA:  I have reviewed the data as listed Lab Results  Component Value Date   WBC 6.2 03/27/2019   HGB 11.4 (L) 03/27/2019   HCT 34.1 (L) 03/27/2019   MCV 96.3 03/27/2019   PLT 385 03/27/2019   Recent Labs    01/30/19 1438 02/13/19 1450 03/27/19 1430  NA 139 138 138  K 4.2 3.9 4.2  CL 106 106 104  CO2 25 24 24   GLUCOSE 103* 125* 130*  BUN 26* 17 25*  CREATININE 1.38* 1.40* 1.35*  CALCIUM 9.8 9.3 9.5  GFRNONAA 36* 35* 37*  GFRAA 42* 41* 43*  PROT 7.8  --  7.2  ALBUMIN 4.1  --  4.0  AST 19  --  19  ALT 12  --  11  ALKPHOS 74  --  66  BILITOT 0.6  --  0.6     No results found.  ASSESSMENT & PLAN:   Essential thrombocytosis (HCC) #Essential thrombocytosis-CALR positive; Intermediate risk given her age; on hydrea 500 mg/day; tolerating well-except for mild diarrhea.  #Platelets today 385.  Hemoglobin slightly at 11 white count normal.  Goal platelets less than 400,000  #Diarrhea-grade 1 [1-3 loose stools a day; intermittent]  secondary to Hydrea.  Continue Imodium.  If worse I would recommend Hydrea 1 pill every other day.  # A.fib- on xarelto; hold off aspirin.  Stable  #CKD stage III-stable  I spoke at length with the patient's family, son- regarding the patient's clinical status/plan of care.  Family agreement.   # DISPOSITION:  # follow up in 2 month-MD-labs; cbc/cmp- Dr.B  Cc: Drs. Baboff/Golan    Cammie Sickle, MD 03/27/2019 4:21 PM

## 2019-03-27 NOTE — Progress Notes (Signed)
Pt in for follow up, denies any concerns.  Reports does have loose stools frequently due to medication.  Taking Imodium prn.

## 2019-03-27 NOTE — Assessment & Plan Note (Addendum)
#  Essential thrombocytosis-CALR positive; Intermediate risk given her age; on hydrea 500 mg/day; tolerating well-except for mild diarrhea.  #Platelets today 385.  Hemoglobin slightly at 11 white count normal.  Goal platelets less than 400,000  #Diarrhea-grade 1 [1-3 loose stools a day; intermittent] secondary to Hydrea.  Continue Imodium.  If worse I would recommend Hydrea 1 pill every other day.  # A.fib- on xarelto; hold off aspirin.  Stable  #CKD stage III-stable  I spoke at length with the patient's family, son- regarding the patient's clinical status/plan of care.  Family agreement.   # DISPOSITION:  # follow up in 2 month-MD-labs; cbc/cmp- Dr.B  Cc: Drs. Baboff/Golan

## 2019-05-29 ENCOUNTER — Other Ambulatory Visit: Payer: Self-pay

## 2019-05-29 ENCOUNTER — Inpatient Hospital Stay (HOSPITAL_BASED_OUTPATIENT_CLINIC_OR_DEPARTMENT_OTHER): Payer: Medicare HMO | Admitting: Internal Medicine

## 2019-05-29 ENCOUNTER — Inpatient Hospital Stay: Payer: Medicare HMO | Attending: Internal Medicine

## 2019-05-29 DIAGNOSIS — Z90710 Acquired absence of both cervix and uterus: Secondary | ICD-10-CM | POA: Insufficient documentation

## 2019-05-29 DIAGNOSIS — D473 Essential (hemorrhagic) thrombocythemia: Secondary | ICD-10-CM | POA: Diagnosis not present

## 2019-05-29 DIAGNOSIS — I482 Chronic atrial fibrillation, unspecified: Secondary | ICD-10-CM | POA: Diagnosis not present

## 2019-05-29 DIAGNOSIS — Z90722 Acquired absence of ovaries, bilateral: Secondary | ICD-10-CM | POA: Diagnosis not present

## 2019-05-29 DIAGNOSIS — I10 Essential (primary) hypertension: Secondary | ICD-10-CM | POA: Insufficient documentation

## 2019-05-29 DIAGNOSIS — N183 Chronic kidney disease, stage 3 unspecified: Secondary | ICD-10-CM | POA: Insufficient documentation

## 2019-05-29 DIAGNOSIS — E785 Hyperlipidemia, unspecified: Secondary | ICD-10-CM | POA: Diagnosis not present

## 2019-05-29 DIAGNOSIS — Z87891 Personal history of nicotine dependence: Secondary | ICD-10-CM | POA: Diagnosis not present

## 2019-05-29 DIAGNOSIS — Z8249 Family history of ischemic heart disease and other diseases of the circulatory system: Secondary | ICD-10-CM | POA: Insufficient documentation

## 2019-05-29 DIAGNOSIS — Z79899 Other long term (current) drug therapy: Secondary | ICD-10-CM | POA: Diagnosis not present

## 2019-05-29 DIAGNOSIS — Z7901 Long term (current) use of anticoagulants: Secondary | ICD-10-CM | POA: Insufficient documentation

## 2019-05-29 DIAGNOSIS — R197 Diarrhea, unspecified: Secondary | ICD-10-CM | POA: Insufficient documentation

## 2019-05-29 LAB — CBC WITH DIFFERENTIAL/PLATELET
Abs Immature Granulocytes: 0.05 10*3/uL (ref 0.00–0.07)
Basophils Absolute: 0.1 10*3/uL (ref 0.0–0.1)
Basophils Relative: 1 %
Eosinophils Absolute: 0.1 10*3/uL (ref 0.0–0.5)
Eosinophils Relative: 1 %
HCT: 35.1 % — ABNORMAL LOW (ref 36.0–46.0)
Hemoglobin: 11.7 g/dL — ABNORMAL LOW (ref 12.0–15.0)
Immature Granulocytes: 1 %
Lymphocytes Relative: 26 %
Lymphs Abs: 1.8 10*3/uL (ref 0.7–4.0)
MCH: 35.6 pg — ABNORMAL HIGH (ref 26.0–34.0)
MCHC: 33.3 g/dL (ref 30.0–36.0)
MCV: 106.7 fL — ABNORMAL HIGH (ref 80.0–100.0)
Monocytes Absolute: 0.9 10*3/uL (ref 0.1–1.0)
Monocytes Relative: 12 %
Neutro Abs: 4.2 10*3/uL (ref 1.7–7.7)
Neutrophils Relative %: 59 %
Platelets: 506 10*3/uL — ABNORMAL HIGH (ref 150–400)
RBC: 3.29 MIL/uL — ABNORMAL LOW (ref 3.87–5.11)
RDW: 15.9 % — ABNORMAL HIGH (ref 11.5–15.5)
WBC: 7.1 10*3/uL (ref 4.0–10.5)
nRBC: 0 % (ref 0.0–0.2)

## 2019-05-29 LAB — COMPREHENSIVE METABOLIC PANEL
ALT: 13 U/L (ref 0–44)
AST: 19 U/L (ref 15–41)
Albumin: 4 g/dL (ref 3.5–5.0)
Alkaline Phosphatase: 78 U/L (ref 38–126)
Anion gap: 6 (ref 5–15)
BUN: 22 mg/dL (ref 8–23)
CO2: 25 mmol/L (ref 22–32)
Calcium: 9 mg/dL (ref 8.9–10.3)
Chloride: 104 mmol/L (ref 98–111)
Creatinine, Ser: 1.38 mg/dL — ABNORMAL HIGH (ref 0.44–1.00)
GFR calc Af Amer: 42 mL/min — ABNORMAL LOW (ref >60)
GFR calc non Af Amer: 36 mL/min — ABNORMAL LOW (ref >60)
Glucose, Bld: 125 mg/dL — ABNORMAL HIGH (ref 70–99)
Potassium: 4 mmol/L (ref 3.5–5.1)
Sodium: 135 mmol/L (ref 135–145)
Total Bilirubin: 0.7 mg/dL (ref 0.3–1.2)
Total Protein: 7.3 g/dL (ref 6.5–8.1)

## 2019-05-29 MED ORDER — HYDROXYUREA 500 MG PO CAPS: 500.0000 mg | ORAL_CAPSULE | Freq: Every day | ORAL | 3 refills | Status: DC

## 2019-05-29 NOTE — Assessment & Plan Note (Addendum)
#  Essential thrombocytosis-CALR positive; Intermediate risk given her age; on hydrea 500 mg/day; tolerating well.   #Platelets today 506.  Hemoglobin slightly low at 11.7 white count normal.  Goal platelets less than 400,000. Continue hydrea 500mg /day.   #Diarrhea-grade 1 [1-3 loose stools a day; intermittent] secondary to Hydrea.  Stable.   # A.fib- on xarelto; hold off aspirin.  stable  #CKD stage III-stable  # DISPOSITION:  # follow up in 3 month-MD-labs; cbc/cmp- Dr.B  Cc: Drs. Baboff/Golan

## 2019-05-29 NOTE — Progress Notes (Signed)
[facility] CONSULT NOTE  Patient Care Team: Derinda Late, MD as PCP - General (Family Medicine) Rockey Situ Kathlene November, MD as PCP - Cardiology (Cardiology) Minna Merritts, MD as Consulting Physician (Cardiology)  CHIEF COMPLAINTS/PURPOSE OF CONSULTATION: Thrombocytosis  Oncology History Overview Note  # ESSENTIAL THROMBOCYTOSIS [[June 2019- [id number]; July 2020- platelets-992 ; Hb-12 white count- 9];INTERMEDIATE RISK;July 2020-; CALR MUTATION POSITIVE; July 2020- Hydrea 500 mg/day.   # A.fib on xarelto [A.fib]  # Chronic Kidney disease-III   Essential thrombocytosis ([place])  01/30/2019 Initial Diagnosis   Essential thrombocytosis ([place])       HISTORY OF PRESENTING ILLNESS:  Paula Clark 78 y.o.  female pleasant patient with essential thrombocytosis currently on Hydrea is here for follow-up.  Patient admits to 1-2 loose stools a day.  Otherwise no worsening diarrhea.  She takes Imodium only as needed.  No nausea no vomiting.  No headaches.  No symptoms of stroke.  No falls.   Review of Systems  Constitutional: Positive for malaise/fatigue. Negative for chills, diaphoresis, fever and weight loss.  HENT: Negative for nosebleeds and sore throat.   Eyes: Negative for double vision.  Respiratory: Negative for cough, hemoptysis, sputum production, shortness of breath and wheezing.   Cardiovascular: Negative for chest pain, palpitations, orthopnea and leg swelling.  Gastrointestinal: Negative for abdominal pain, blood in stool, constipation, diarrhea, heartburn, melena, nausea and vomiting.  Genitourinary: Negative for dysuria, frequency and urgency.  Musculoskeletal: Positive for joint pain. Negative for back pain.  Skin: Negative.  Negative for itching and rash.  Neurological: Negative for dizziness, tingling, focal weakness, weakness and headaches.  Endo/Heme/Allergies: Does not bruise/bleed easily.  Psychiatric/Behavioral: Negative for depression. The patient is not  nervous/anxious and does not have insomnia.      MEDICAL HISTORY:  Past Medical History:  Diagnosis Date  . Chronic atrial fibrillation    a. Unsuccessful DCCV 05/2016; b. CHADS2VASc => 4 (HTN, age x 2, female)--> Xarelto.  . CKD (chronic kidney disease), stage III ([place])   . Epistaxis   . Gout   . History of stress test    a. 04/2016 MV: EF 80%, no ischemia.  Marland Kitchen HLD (hyperlipidemia)   . Hypertension   . Mitral regurgitation    a. Echo 04/2016: EF 60-65%, no RWMA, mild AI, mild MR, mild biatrial enlargement, PASP 32 mmHg, trivial pericardial effusion noted posterior to the heart  . Syncope     SURGICAL HISTORY: Past Surgical History:  Procedure Laterality Date  . ABDOMINAL HYSTERECTOMY    . APPENDECTOMY    . ELECTROPHYSIOLOGIC STUDY N/A 05/18/2016   Procedure: CARDIOVERSION;  Surgeon: Minna Merritts, MD;  Location: [facility] ORS;  Service: Cardiovascular;  Laterality: N/A;  . OOPHORECTOMY    . TONSILLECTOMY    . TOTAL VAGINAL HYSTERECTOMY      SOCIAL HISTORY: Social History   Socioeconomic History  . Marital status: Widowed    Spouse name: Not on file  . Number of children: Not on file  . Years of education: Not on file  . Highest education level: Not on file  Occupational History  . Not on file  Social Needs  . Financial resource strain: Not on file  . Food insecurity    Worry: Not on file    Inability: Not on file  . Transportation needs    Medical: Not on file    Non-medical: Not on file  Tobacco Use  . Smoking status: Former Research scientist (life sciences)  . Smokeless tobacco: Never Used  Substance  and Sexual Activity  . Alcohol use: No  . Drug use: No  . Sexual activity: Not on file  Lifestyle  . Physical activity    Days per week: Not on file    Minutes per session: Not on file  . Stress: Not on file  Relationships  . Social Herbalist on phone: Not on file    Gets together: Not on file    Attends religious service: Not on file    Active member of club or  organization: Not on file    Attends meetings of clubs or organizations: Not on file    Relationship status: Not on file  . Intimate partner violence    Fear of current or ex partner: Not on file    Emotionally abused: Not on file    Physically abused: Not on file    Forced sexual activity: Not on file  Other Topics Concern  . Not on file  Social History Narrative   [name]; self; remote smoking; No alcohol; worked in [employer] retd.     FAMILY HISTORY: Family History  Problem Relation Age of Onset  . Heart failure Mother   . Atrial fibrillation Brother     ALLERGIES:  is allergic to codeine.  MEDICATIONS:  Current Outpatient Medications  Medication Sig Dispense Refill  . acetaminophen (TYLENOL) 500 MG tablet Take 500-1,000 mg by mouth every 6 (six) hours as needed for moderate pain or headache.    . cetirizine (ZYRTEC) 10 MG tablet Take 10 mg by mouth daily.    . Cholecalciferol (VITAMIN D3) 1000 units CAPS Take 1,000 Units by mouth daily.    . Cholecalciferol-Vitamin C (VITAMIN D3-VITAMIN C) 1000-500 UNIT-MG CAPS Take 1,000 Units by mouth.    . diltiazem (CARDIZEM CD) 240 MG 24 hr capsule Take 1 capsule (240 mg total) by mouth daily. 60 capsule 1  . doxazosin (CARDURA) 8 MG tablet Take 8 mg by mouth daily.     . hydroxyurea (HYDREA) 500 MG capsule Take 1 capsule (500 mg total) by mouth daily. May take with food to minimize GI side effects. 30 capsule 3  . loperamide (IMODIUM A-D) 2 MG capsule Take 2 mg by mouth as needed for diarrhea or loose stools.    Alveda Reasons 15 MG TABS tablet TAKE 1 TABLET (15 MG TOTAL) BY MOUTH DAILY WITH SUPPER. 90 tablet 1   No current facility-administered medications for this visit.      PHYSICAL EXAMINATION:   Vitals:   05/29/19 1420  BP: (!) 157/71  Pulse: 80  Resp: 16  Temp: 97.8 F (36.6 C)   Filed Weights   05/29/19 1420  Weight: 145 lb 9.6 oz (66 kg)    Physical Exam  Constitutional: She is oriented to person, place,  and time and well-developed, well-nourished, and in no distress.  HENT:  Head: Normocephalic and atraumatic.  Mouth/Throat: Oropharynx is clear and moist. No oropharyngeal exudate.  Eyes: Pupils are equal, round, and reactive to light.  Neck: Normal range of motion. Neck supple.  Cardiovascular: Normal rate and regular rhythm.  Pulmonary/Chest: Effort normal and breath sounds normal. No respiratory distress. She has no wheezes.  Abdominal: Soft. Bowel sounds are normal. She exhibits no distension and no mass. There is no abdominal tenderness. There is no rebound and no guarding.  Musculoskeletal: Normal range of motion.        General: No tenderness or edema.  Neurological: She is alert and oriented to person, place,  and time.  Skin: Skin is warm.  Psychiatric: Affect normal.     LABORATORY DATA:  I have reviewed the data as listed Lab Results  Component Value Date   WBC 7.1 05/29/2019   HGB 11.7 (L) 05/29/2019   HCT 35.1 (L) 05/29/2019   MCV 106.7 (H) 05/29/2019   PLT 506 (H) 05/29/2019   Recent Labs    01/30/19 1438 02/13/19 1450 03/27/19 1430 05/29/19 1414  NA 139 138 138 135  K 4.2 3.9 4.2 4.0  CL 106 106 104 104  CO2 25 24 24 25   GLUCOSE 103* 125* 130* 125*  BUN 26* 17 25* 22  CREATININE 1.38* 1.40* 1.35* 1.38*  CALCIUM 9.8 9.3 9.5 9.0  GFRNONAA 36* 35* 37* 36*  GFRAA 42* 41* 43* 42*  PROT 7.8  --  7.2 7.3  ALBUMIN 4.1  --  4.0 4.0  AST 19  --  19 19  ALT 12  --  11 13  ALKPHOS 74  --  66 78  BILITOT 0.6  --  0.6 0.7     No results found.  ASSESSMENT & PLAN:   Essential thrombocytosis (HCC) #Essential thrombocytosis-CALR positive; Intermediate risk given her age; on hydrea 500 mg/day; tolerating well.   #Platelets today 506.  Hemoglobin slightly low at 11.7 white count normal.  Goal platelets less than 400,000. Continue hydrea 500mg /day.   #Diarrhea-grade 1 [1-3 loose stools a day; intermittent] secondary to Hydrea.  Stable.   # A.fib- on xarelto;  hold off aspirin.  stable  #CKD stage III-stable  # DISPOSITION:  # follow up in 3 month-MD-labs; cbc/cmp- Dr.B  Cc: Drs. Baboff/Golan    Cammie Sickle, MD 05/29/2019 3:11 PM

## 2019-06-12 ENCOUNTER — Telehealth: Payer: Self-pay | Admitting: *Deleted

## 2019-06-12 NOTE — Telephone Encounter (Signed)
Call returned to patient and advised ok to continue Hydrea, but that she needs to check with Dr Baldemar Lenis regarding her Xarelto and ASA for clearance. She stated "ok, I will"

## 2019-06-12 NOTE — Telephone Encounter (Signed)
Paula Clark- it ok to have the pt have her tooth pulled; and hydrea need not be stopped as her counts are overall stable.   But, she is also on xarelto/ asprin- for which she will need clearance her prescribers. Thanks,GB

## 2019-06-12 NOTE — Telephone Encounter (Signed)
Patient called reporting that she needs to ave a tooth pulled and that she is on a low dose of chemotherapy, she is asking if she needs to hold it or what to do to get this taken care of. I see that she takes Hydrea. Please advise

## 2019-08-28 ENCOUNTER — Inpatient Hospital Stay: Payer: Medicare HMO | Admitting: Internal Medicine

## 2019-08-28 ENCOUNTER — Inpatient Hospital Stay: Payer: Medicare HMO | Attending: Internal Medicine

## 2019-09-08 ENCOUNTER — Other Ambulatory Visit: Payer: Self-pay | Admitting: Cardiovascular Disease

## 2019-09-08 DIAGNOSIS — I4819 Other persistent atrial fibrillation: Secondary | ICD-10-CM

## 2019-09-10 NOTE — Telephone Encounter (Signed)
Pt's age 79, wt 36 kg, SCr 1.38, CrCl 33.88, last ov w/ CB 07/28/18 - sched to see TG 09/25/19.

## 2019-09-10 NOTE — Telephone Encounter (Signed)
Refill Request.  

## 2019-09-18 ENCOUNTER — Other Ambulatory Visit: Payer: Self-pay | Admitting: Internal Medicine

## 2019-09-18 NOTE — Telephone Encounter (Signed)
CBC with Differential Order: [id number] Status:  Final result  Visible to patient:  Yes (MyChart)  Next appt:  09/25/2019 at 02:40 PM in Cardiology Ida Rogue, MD)  Dx:  Essential thrombocytosis ([place])  Ref Range & Units 3 mo ago  WBC 4.0 - 10.5 K/uL 7.1   RBC 3.87 - 5.11 MIL/uL 3.29Low    Hemoglobin 12.0 - 15.0 g/dL 11.7Low    HCT 36.0 - 46.0 % 35.1Low    MCV 80.0 - 100.0 fL 106.7High    MCH 26.0 - 34.0 pg 35.6High    MCHC 30.0 - 36.0 g/dL 33.3   RDW 11.5 - 15.5 % 15.9High    Platelets 150 - 400 K/uL 506High    nRBC 0.0 - 0.2 % 0.0   Neutrophils Relative % % 59   Neutro Abs 1.7 - 7.7 K/uL 4.2   Lymphocytes Relative % 26   Lymphs Abs 0.7 - 4.0 K/uL 1.8   Monocytes Relative % 12   Monocytes Absolute 0.1 - 1.0 K/uL 0.9   Eosinophils Relative % 1   Eosinophils Absolute 0.0 - 0.5 K/uL 0.1   Basophils Relative % 1   Basophils Absolute 0.0 - 0.1 K/uL 0.1   Immature Granulocytes % 1   Abs Immature Granulocytes 0.00 - 0.07 K/uL 0.05   Comment: Performed at [facility], [address] [place], [place] [address]  Resulting Agency  [facility]      Specimen Collected: 05/29/19 14:14  Last Resulted: 05/29/19 14:19     Lab Flowsheet   Order Details   View Encounter   Lab and Collection Details   Routing   Result History         Other Results from 05/29/2019  Contains abnormal data Comprehensive metabolic panel  Status:  Final result  Visible to patient:  Yes (MyChart)  Next appt:  09/25/2019 at 02:40 PM in Cardiology Ida Rogue, MD)  Dx:  Essential thrombocytosis ([place]) Order: [id number]  Ref Range & Units 3 mo ago  Sodium 135 - 145 mmol/L 135   Potassium 3.5 - 5.1 mmol/L 4.0   Chloride 98 - 111 mmol/L 104   CO2 22 - 32 mmol/L 25   Glucose, Bld 70 - 99 mg/dL 125High    BUN 8 - 23 mg/dL 22   Creatinine, Ser 0.44 - 1.00 mg/dL 1.38High    Calcium 8.9 - 10.3 mg/dL 9.0   Total Protein 6.5 - 8.1 g/dL 7.3   Albumin 3.5 - 5.0 g/dL 4.0   AST 15 - 41  U/L 19   ALT 0 - 44 U/L 13   Alkaline Phosphatase 38 - 126 U/L 78   Total Bilirubin 0.3 - 1.2 mg/dL 0.7   GFR calc non Af Amer >60 mL/min 36Low    GFR calc Af Amer >60 mL/min 42Low    Anion gap 5 - 15 6   Comment: Performed at [facility], [address] [place], [place] [address]  Resulting Agency  [facility] LAB      Specimen Collected: 05/29/19 14:14  Last Resulted: 05/29/19 14:31

## 2019-09-21 ENCOUNTER — Other Ambulatory Visit: Payer: Self-pay | Admitting: Cardiovascular Disease

## 2019-09-23 NOTE — Progress Notes (Signed)
Cardiology Office Note  Date:  09/25/2019   ID:  Paula Clark, DOB 1941-05-16, MRN [id number]  PCP:  Derinda Late, MD   Chief Complaint  Patient presents with  . office visit    Pt has no complaints. Meds vebally reviewed w/ pt.    HPI:  79 year old woman With history of  hypertension,  persistent atrial fibrillation,   CHADSVASc score of at least 4. syncope in the setting of dehydration,  Stress test 04/30/2016 no ischemia Attempted cardioversion for atrial fibrillation 05/18/2016, failed  severe epistaxis right nostril 04/07/2016 emergency room 04/11/2016 with epistaxis x4,  seen by ENT in the emergency room Admitted to the hospital 04/12/2016 Referred to Dr. Burt Knack for watchman device CRI Who presents for follow-up of her chronic atrial fibrillation  On last clinic visit had numerous issues to discuss chronic fatigue, weakness, legs do not work, unable to walk very far Chronic sinusitis  upper respiratory infection Nosebleeds had resolved ,  tolerating anticoagulation She was having weight loss 9 pounds last year  Fearful of covid vaccine Discussed in detail  Feel well, staying inside  Trace edema in the lower extremities High fluid intake  Does not check her blood pressure at home Running high today Blood pressure [id number] systolic even on my recheck  Lab work reviewed with her in detail Total chol 166, LDL 104  EKG personally reviewed by myself on todays visit shows atrial fibrillation with ventricular rate 75 bpm nonspecific ST abnormality  Other past medical history reviewed evaluated by Dr. Burt Knack November 2017 for watchman device  Dr. Burt Knack was going to discuss case with ENT physician prior to scheduling TEE There was some concern that she would not tolerate aspirin and Plavix TEE was never performed    Noted to be in atrial fibrillation in the emergency room, no prior EKGs available but patient responded at that time that she had atrial fibrillation  before but was not on anticoagulation  Admitted back to the hospital 04/16/2016 with fall, weakness, dehydration, creatinine 1.6 (improved with IV hydration overnight), discharge on 04/18/2016  She was seen by our office, ryan Dunn 04/21/2016, scheduled for cardioversion Cardioversion attempted 05/18/2016 unsuccessful.  suspect  possibly chronic    PMH:   has a past medical history of Chronic atrial fibrillation ([place]), CKD (chronic kidney disease), stage III, Epistaxis, Gout, History of stress test, HLD (hyperlipidemia), Hypertension, Mitral regurgitation, and Syncope.  PSH:    Past Surgical History:  Procedure Laterality Date  . ABDOMINAL HYSTERECTOMY    . APPENDECTOMY    . ELECTROPHYSIOLOGIC STUDY N/A 05/18/2016   Procedure: CARDIOVERSION;  Surgeon: Minna Merritts, MD;  Location: [facility] ORS;  Service: Cardiovascular;  Laterality: N/A;  . OOPHORECTOMY    . TONSILLECTOMY    . TOTAL VAGINAL HYSTERECTOMY      Current Outpatient Medications  Medication Sig Dispense Refill  . acetaminophen (TYLENOL) 500 MG tablet Take 500-1,000 mg by mouth every 6 (six) hours as needed for moderate pain or headache.    . cetirizine (ZYRTEC) 10 MG tablet Take 10 mg by mouth daily.    . Cholecalciferol (VITAMIN D3) 1000 units CAPS Take 1,000 Units by mouth daily.    . Cholecalciferol-Vitamin C (VITAMIN D3-VITAMIN C) 1000-500 UNIT-MG CAPS Take 1,000 Units by mouth.    . diltiazem (CARDIZEM CD) 240 MG 24 hr capsule Take 1 capsule (240 mg total) by mouth daily. 60 capsule 1  . doxazosin (CARDURA) 8 MG tablet Take 8 mg by mouth daily.     Marland Kitchen  hydroxyurea (HYDREA) 500 MG capsule TAKE 1 CAPSULE (500 MG TOTAL) BY MOUTH DAILY. MAY TAKE WITH FOOD TO MINIMIZE GI SIDE EFFECTS. 90 capsule 1  . loperamide (IMODIUM A-D) 2 MG capsule Take 2 mg by mouth as needed for diarrhea or loose stools.    Alveda Reasons 15 MG TABS tablet TAKE 1 TABLET (15 MG TOTAL) BY MOUTH DAILY WITH SUPPER. 90 tablet 1   No current  facility-administered medications for this visit.     Allergies:   Codeine   Social History:  The patient  reports that she has quit smoking. She has never used smokeless tobacco. She reports that she does not drink alcohol or use drugs.   Family History:   family history includes Atrial fibrillation in her brother; Heart failure in her mother.    Review of Systems: Review of Systems  Constitutional: Positive for malaise/fatigue.  HENT: Negative.   Respiratory: Negative.   Cardiovascular: Negative.   Gastrointestinal: Negative.   Musculoskeletal: Negative.        Leg weakness  Neurological: Positive for weakness.  Psychiatric/Behavioral: Negative.   All other systems reviewed and are negative.   PHYSICAL EXAM: VS:  BP (!) 182/82 (BP Location: Left Arm, Patient Position: Sitting, Cuff Size: Normal)   Pulse 75   Ht 5\' 2"  (1.575 m)   Wt 146 lb (66.2 kg)   SpO2 96%   BMI 26.70 kg/m  , BMI Body mass index is 26.7 kg/m. GEN: Well nourished, well developed, in no acute distress  HEENT: normal  Neck: no JVD, carotid bruits, or masses Cardiac:Irregular rate and rhythm,no murmurs, rubs, or gallops,no edema  Respiratory:  clear to auscultation bilaterally, normal work of breathing GI: soft, nontender, nondistended, + BS MS: no deformity or atrophy  Skin: warm and dry, no rash Neuro:  Strength and sensation are intact Psych: euthymic mood, full affect  Recent Labs: 05/29/2019: ALT 13; BUN 22; Creatinine, Ser 1.38; Hemoglobin 11.7; Platelets 506; Potassium 4.0; Sodium 135    Lipid Panel No results found for: CHOL, HDL, LDLCALC, TRIG    Wt Readings from Last 3 Encounters:  09/25/19 146 lb (66.2 kg)  05/29/19 145 lb 9.6 oz (66 kg)  03/27/19 146 lb 9.6 oz (66.5 kg)       ASSESSMENT AND PLAN:  Permanent atrial fibrillation (HCC) - Plan: EKG 12-Lead  tolerating xarelto 15 mg daily Low creatinine clearance,  No further nosebleeds CHADS VASC 4 rate well controlled No  changes made, continue diltiazem  Essential hypertension -  Weight running high, does not check at home Was 140/80 with primary care January 2021 No medication changes made, recommended she monitor blood pressure at home and call us in the next week with some numbers  Epistaxis No recent bleeding, tolerating anticoagulation Stable  Weakness Recommended regular exercise program No recent falls   Total encounter time more than 25 minutes  Greater than 50% was spent in counseling and coordination of care with the patient   Disposition:   F/U  12 months   No orders of the defined types were placed in this encounter.    Signed, Esmond Plants, M.D., Ph.D. 09/25/2019  [place], [place]

## 2019-09-25 ENCOUNTER — Ambulatory Visit (INDEPENDENT_AMBULATORY_CARE_PROVIDER_SITE_OTHER): Payer: Medicare HMO | Admitting: Cardiovascular Disease

## 2019-09-25 ENCOUNTER — Encounter: Payer: Self-pay | Admitting: Cardiovascular Disease

## 2019-09-25 ENCOUNTER — Other Ambulatory Visit: Payer: Self-pay

## 2019-09-25 VITALS — BP 182/82 | HR 75 | Ht 62.0 in | Wt 146.0 lb

## 2019-09-25 DIAGNOSIS — R531 Weakness: Secondary | ICD-10-CM | POA: Diagnosis not present

## 2019-09-25 DIAGNOSIS — I10 Essential (primary) hypertension: Secondary | ICD-10-CM | POA: Diagnosis not present

## 2019-09-25 DIAGNOSIS — I160 Hypertensive urgency: Secondary | ICD-10-CM

## 2019-09-25 DIAGNOSIS — I4819 Other persistent atrial fibrillation: Secondary | ICD-10-CM

## 2019-09-25 MED ORDER — FUROSEMIDE 20 MG PO TABS: 20.0000 mg | ORAL_TABLET | Freq: Every day | ORAL | 3 refills | Status: DC | PRN

## 2019-09-25 NOTE — Patient Instructions (Addendum)
Leg elevation, Compression hose for leg swelling  Medication Instructions:  Please take lasix 20 mg daily as needed for leg swelling Try not to take more than 2x a week  If you need a refill on your cardiac medications before your next appointment, please call your pharmacy.   Lab work: No new labs needed   If you have labs (blood work) drawn today and your tests are completely normal, you will receive your results only by: Marland Kitchen MyChart Message (if you have MyChart) OR . A paper copy in the mail If you have any lab test that is abnormal or we need to change your treatment, we will call you to review the results.   Testing/Procedures: No new testing needed   Follow-Up: At [facility], you and your health needs are our priority.  As part of our continuing mission to provide you with exceptional heart care, we have created designated Provider Care Teams.  These Care Teams include your primary Cardiologist (physician) and Advanced Practice Providers (APPs -  Physician Assistants and Nurse Practitioners) who all work together to provide you with the care you need, when you need it.  . You will need a follow up appointment in 12 months  . Providers on your designated Care Team:   . Murray Hodgkins, NP . Christell Faith, PA-C . Marrianne Mood, PA-C  Any Other Special Instructions Will Be Listed Below (If Applicable).  COVID-19 Vaccine Information can be found at: ShippingScam.co.uk For questions related to vaccine distribution or appointments, please email vaccine@[email or web address] .[email or web address] or call [phone].     How to Take Your Blood Pressure You can take your blood pressure at home with a machine. You may need to check your blood pressure at home:  To check if you have high blood pressure (hypertension).  To check your blood pressure over time.  To make sure your blood pressure medicine is working. Supplies needed: You will  need a blood pressure machine, or monitor. You can buy one at a drugstore or online. When choosing one:  Choose one with an arm cuff.  Choose one that wraps around your upper arm. Only one finger should fit between your arm and the cuff.  Do not choose one that measures your blood pressure from your wrist or finger. Your doctor can suggest a monitor. How to prepare Avoid these things for 30 minutes before checking your blood pressure:  Drinking caffeine.  Drinking alcohol.  Eating.  Smoking.  Exercising. Five minutes before checking your blood pressure:  Pee.  Sit in a dining chair. Avoid sitting in a soft couch or armchair.  Be quiet. Do not talk. How to take your blood pressure Follow the instructions that came with your machine. If you have a digital blood pressure monitor, these may be the instructions: 1. Sit up straight. 2. Place your feet on the floor. Do not cross your ankles or legs. 3. Rest your left arm at the level of your heart. You may rest it on a table, desk, or chair. 4. Pull up your shirt sleeve. 5. Wrap the blood pressure cuff around the upper part of your left arm. The cuff should be 1 inch (2.5 cm) above your elbow. It is best to wrap the cuff around bare skin. 6. Fit the cuff snugly around your arm. You should be able to place only one finger between the cuff and your arm. 7. Put the cord inside the groove of your elbow. 8. Press the power button. 9. Sit quietly  while the cuff fills with air and loses air. 10. Write down the numbers on the screen. 11. Wait 2-3 minutes and then repeat steps 1-10. What do the numbers mean? Two numbers make up your blood pressure. The first number is called systolic pressure. The second is called diastolic pressure. An example of a blood pressure reading is "120 over 80" (or 120/80). If you are an adult and do not have a medical condition, use this guide to find out if your blood pressure is normal: Normal  First  number: below 120.  Second number: below 80. Elevated  First number: 120-129.  Second number: below 80. Hypertension stage 1  First number: 130-139.  Second number: 80-89. Hypertension stage 2  First number: 140 or above.  Second number: 102 or above. Your blood pressure is above normal even if only the top or bottom number is above normal. Follow these instructions at home:  Check your blood pressure as often as your doctor tells you to.  Take your monitor to your next doctor's appointment. Your doctor will: ? Make sure you are using it correctly. ? Make sure it is working right.  Make sure you understand what your blood pressure numbers should be.  Tell your doctor if your medicines are causing side effects. Contact a doctor if:  Your blood pressure keeps being high. Get help right away if:  Your first blood pressure number is higher than 180.  Your second blood pressure number is higher than 120. This information is not intended to replace advice given to you by your health care provider. Make sure you discuss any questions you have with your health care provider. Document Revised: 06/10/2017 Document Reviewed: 12/05/2015 Elsevier Patient Education  2020 [employer].

## 2019-09-26 ENCOUNTER — Encounter: Payer: Self-pay | Admitting: Internal Medicine

## 2019-09-26 ENCOUNTER — Inpatient Hospital Stay: Payer: Medicare HMO

## 2019-09-26 ENCOUNTER — Inpatient Hospital Stay: Payer: Medicare HMO | Attending: Internal Medicine | Admitting: Internal Medicine

## 2019-09-26 DIAGNOSIS — Z79899 Other long term (current) drug therapy: Secondary | ICD-10-CM | POA: Insufficient documentation

## 2019-09-26 DIAGNOSIS — E785 Hyperlipidemia, unspecified: Secondary | ICD-10-CM | POA: Insufficient documentation

## 2019-09-26 DIAGNOSIS — D473 Essential (hemorrhagic) thrombocythemia: Secondary | ICD-10-CM | POA: Insufficient documentation

## 2019-09-26 DIAGNOSIS — Z87891 Personal history of nicotine dependence: Secondary | ICD-10-CM | POA: Insufficient documentation

## 2019-09-26 DIAGNOSIS — R5381 Other malaise: Secondary | ICD-10-CM | POA: Diagnosis not present

## 2019-09-26 DIAGNOSIS — N183 Chronic kidney disease, stage 3 unspecified: Secondary | ICD-10-CM | POA: Diagnosis not present

## 2019-09-26 DIAGNOSIS — R5383 Other fatigue: Secondary | ICD-10-CM | POA: Diagnosis not present

## 2019-09-26 DIAGNOSIS — Z7901 Long term (current) use of anticoagulants: Secondary | ICD-10-CM | POA: Diagnosis not present

## 2019-09-26 DIAGNOSIS — I482 Chronic atrial fibrillation, unspecified: Secondary | ICD-10-CM | POA: Diagnosis not present

## 2019-09-26 DIAGNOSIS — I129 Hypertensive chronic kidney disease with stage 1 through stage 4 chronic kidney disease, or unspecified chronic kidney disease: Secondary | ICD-10-CM | POA: Diagnosis not present

## 2019-09-26 LAB — CBC WITH DIFFERENTIAL/PLATELET
Abs Immature Granulocytes: 0.04 10*3/uL (ref 0.00–0.07)
Basophils Absolute: 0 10*3/uL (ref 0.0–0.1)
Basophils Relative: 1 %
Eosinophils Absolute: 0.1 10*3/uL (ref 0.0–0.5)
Eosinophils Relative: 1 %
HCT: 35.9 % — ABNORMAL LOW (ref 36.0–46.0)
Hemoglobin: 12 g/dL (ref 12.0–15.0)
Immature Granulocytes: 1 %
Lymphocytes Relative: 31 %
Lymphs Abs: 1.9 10*3/uL (ref 0.7–4.0)
MCH: 36.8 pg — ABNORMAL HIGH (ref 26.0–34.0)
MCHC: 33.4 g/dL (ref 30.0–36.0)
MCV: 110.1 fL — ABNORMAL HIGH (ref 80.0–100.0)
Monocytes Absolute: 0.8 10*3/uL (ref 0.1–1.0)
Monocytes Relative: 13 %
Neutro Abs: 3.2 10*3/uL (ref 1.7–7.7)
Neutrophils Relative %: 53 %
Platelets: 450 10*3/uL — ABNORMAL HIGH (ref 150–400)
RBC: 3.26 MIL/uL — ABNORMAL LOW (ref 3.87–5.11)
RDW: 13.6 % (ref 11.5–15.5)
WBC: 6 10*3/uL (ref 4.0–10.5)
nRBC: 0 % (ref 0.0–0.2)

## 2019-09-26 LAB — COMPREHENSIVE METABOLIC PANEL
ALT: 12 U/L (ref 0–44)
AST: 19 U/L (ref 15–41)
Albumin: 4.1 g/dL (ref 3.5–5.0)
Alkaline Phosphatase: 73 U/L (ref 38–126)
Anion gap: 8 (ref 5–15)
BUN: 23 mg/dL (ref 8–23)
CO2: 26 mmol/L (ref 22–32)
Calcium: 9.4 mg/dL (ref 8.9–10.3)
Chloride: 104 mmol/L (ref 98–111)
Creatinine, Ser: 1.43 mg/dL — ABNORMAL HIGH (ref 0.44–1.00)
GFR calc Af Amer: 40 mL/min — ABNORMAL LOW (ref >60)
GFR calc non Af Amer: 34 mL/min — ABNORMAL LOW (ref >60)
Glucose, Bld: 117 mg/dL — ABNORMAL HIGH (ref 70–99)
Potassium: 3.9 mmol/L (ref 3.5–5.1)
Sodium: 138 mmol/L (ref 135–145)
Total Bilirubin: 0.8 mg/dL (ref 0.3–1.2)
Total Protein: 7.2 g/dL (ref 6.5–8.1)

## 2019-09-26 NOTE — Progress Notes (Signed)
[facility] CONSULT NOTE  Patient Care Team: Derinda Late, MD as PCP - General (Family Medicine) Rockey Situ Kathlene November, MD as PCP - Cardiology (Cardiology) Minna Merritts, MD as Consulting Physician (Cardiology)  CHIEF COMPLAINTS/PURPOSE OF CONSULTATION: Thrombocytosis  Oncology History Overview Note  # ESSENTIAL THROMBOCYTOSIS [[June 2019- [id number]; July 2020- platelets-992 ; Hb-12 white count- 9];INTERMEDIATE RISK;July 2020-; CALR MUTATION POSITIVE; July 2020- Hydrea 500 mg/day.   # A.fib on xarelto [A.fib]  # Chronic Kidney disease-III   Essential thrombocytosis ([place])  01/30/2019 Initial Diagnosis   Essential thrombocytosis ([place])       HISTORY OF PRESENTING ILLNESS:  Paula Clark 79 y.o.  female pleasant patient with essential thrombocytosis currently on Hydrea is here for follow-up.  Patient denies any diarrhea.  Denies any nausea vomiting.  No abdominal pain.  No skin rash.  No headaches.  No falls.   Review of Systems  Constitutional: Positive for malaise/fatigue. Negative for chills, diaphoresis, fever and weight loss.  HENT: Negative for nosebleeds and sore throat.   Eyes: Negative for double vision.  Respiratory: Negative for cough, hemoptysis, sputum production, shortness of breath and wheezing.   Cardiovascular: Negative for chest pain, palpitations, orthopnea and leg swelling.  Gastrointestinal: Negative for abdominal pain, blood in stool, constipation, diarrhea, heartburn, melena, nausea and vomiting.  Genitourinary: Negative for dysuria, frequency and urgency.  Musculoskeletal: Positive for joint pain. Negative for back pain.  Skin: Negative.  Negative for itching and rash.  Neurological: Negative for dizziness, tingling, focal weakness, weakness and headaches.  Endo/Heme/Allergies: Does not bruise/bleed easily.  Psychiatric/Behavioral: Negative for depression. The patient is not nervous/anxious and does not have insomnia.      MEDICAL  HISTORY:  Past Medical History:  Diagnosis Date  . Chronic atrial fibrillation ([place])    a. Unsuccessful DCCV 05/2016; b. CHADS2VASc => 4 (HTN, age x 2, female)--> Xarelto.  . CKD (chronic kidney disease), stage III   . Epistaxis   . Gout   . History of stress test    a. 04/2016 MV: EF 80%, no ischemia.  Marland Kitchen HLD (hyperlipidemia)   . Hypertension   . Mitral regurgitation    a. Echo 04/2016: EF 60-65%, no RWMA, mild AI, mild MR, mild biatrial enlargement, PASP 32 mmHg, trivial pericardial effusion noted posterior to the heart  . Syncope     SURGICAL HISTORY: Past Surgical History:  Procedure Laterality Date  . ABDOMINAL HYSTERECTOMY    . APPENDECTOMY    . ELECTROPHYSIOLOGIC STUDY N/A 05/18/2016   Procedure: CARDIOVERSION;  Surgeon: Minna Merritts, MD;  Location: [facility] ORS;  Service: Cardiovascular;  Laterality: N/A;  . OOPHORECTOMY    . TONSILLECTOMY    . TOTAL VAGINAL HYSTERECTOMY      SOCIAL HISTORY: Social History   Socioeconomic History  . Marital status: Widowed    Spouse name: Not on file  . Number of children: Not on file  . Years of education: Not on file  . Highest education level: Not on file  Occupational History  . Not on file  Tobacco Use  . Smoking status: Former Research scientist (life sciences)  . Smokeless tobacco: Never Used  Substance and Sexual Activity  . Alcohol use: No  . Drug use: No  . Sexual activity: Not on file  Other Topics Concern  . Not on file  Social History Narrative   [name]; self; remote smoking; No alcohol; worked in [place] retd.    Social Determinants of Health   Financial Resource Strain:   .  Difficulty of Paying Living Expenses:   Food Insecurity:   . Worried About Charity fundraiser in the Last Year:   . Arboriculturist in the Last Year:   Transportation Needs:   . Film/video editor (Medical):   Marland Kitchen Lack of Transportation (Non-Medical):   Physical Activity:   . Days of Exercise per Week:   . Minutes of Exercise per Session:    Stress:   . Feeling of Stress :   Social Connections:   . Frequency of Communication with Friends and Family:   . Frequency of Social Gatherings with Friends and Family:   . Attends Religious Services:   . Active Member of Clubs or Organizations:   . Attends Archivist Meetings:   Marland Kitchen Marital Status:   Intimate Partner Violence:   . Fear of Current or Ex-Partner:   . Emotionally Abused:   Marland Kitchen Physically Abused:   . Sexually Abused:     FAMILY HISTORY: Family History  Problem Relation Age of Onset  . Heart failure Mother   . Atrial fibrillation Brother     ALLERGIES:  is allergic to codeine.  MEDICATIONS:  Current Outpatient Medications  Medication Sig Dispense Refill  . acetaminophen (TYLENOL) 500 MG tablet Take 500-1,000 mg by mouth every 6 (six) hours as needed for moderate pain or headache.    . cetirizine (ZYRTEC) 10 MG tablet Take 10 mg by mouth daily.    . Cholecalciferol (VITAMIN D3) 1000 units CAPS Take 1,000 Units by mouth daily.    . Cholecalciferol-Vitamin C (VITAMIN D3-VITAMIN C) 1000-500 UNIT-MG CAPS Take 1,000 Units by mouth.    . diltiazem (CARDIZEM CD) 240 MG 24 hr capsule Take 1 capsule (240 mg total) by mouth daily. 60 capsule 1  . doxazosin (CARDURA) 8 MG tablet Take 8 mg by mouth daily.     . furosemide (LASIX) 20 MG tablet Take 1 tablet (20 mg total) by mouth daily as needed. 30 tablet 3  . hydroxyurea (HYDREA) 500 MG capsule TAKE 1 CAPSULE (500 MG TOTAL) BY MOUTH DAILY. MAY TAKE WITH FOOD TO MINIMIZE GI SIDE EFFECTS. 90 capsule 1  . loperamide (IMODIUM A-D) 2 MG capsule Take 2 mg by mouth as needed for diarrhea or loose stools.    Alveda Reasons 15 MG TABS tablet TAKE 1 TABLET (15 MG TOTAL) BY MOUTH DAILY WITH SUPPER. 90 tablet 1   No current facility-administered medications for this visit.     PHYSICAL EXAMINATION:   Vitals:   09/26/19 1427  BP: (!) 174/71  Pulse: (!) 56  Temp: (!) 96.4 F (35.8 C)   Filed Weights   09/26/19 1427   Weight: 147 lb (66.7 kg)    Physical Exam  Constitutional: She is oriented to person, place, and time and well-developed, well-nourished, and in no distress.  HENT:  Head: Normocephalic and atraumatic.  Mouth/Throat: Oropharynx is clear and moist. No oropharyngeal exudate.  Eyes: Pupils are equal, round, and reactive to light.  Cardiovascular: Normal rate and regular rhythm.  Pulmonary/Chest: Effort normal and breath sounds normal. No respiratory distress. She has no wheezes.  Abdominal: Soft. Bowel sounds are normal. She exhibits no distension and no mass. There is no abdominal tenderness. There is no rebound and no guarding.  Musculoskeletal:        General: No tenderness or edema. Normal range of motion.     Cervical back: Normal range of motion and neck supple.  Neurological: She is alert and oriented to  person, place, and time.  Skin: Skin is warm.  Psychiatric: Affect normal.     LABORATORY DATA:  I have reviewed the data as listed Lab Results  Component Value Date   WBC 6.0 09/26/2019   HGB 12.0 09/26/2019   HCT 35.9 (L) 09/26/2019   MCV 110.1 (H) 09/26/2019   PLT 450 (H) 09/26/2019   Recent Labs    03/27/19 1430 05/29/19 1414 09/26/19 1414  NA 138 135 138  K 4.2 4.0 3.9  CL 104 104 104  CO2 24 25 26   GLUCOSE 130* 125* 117*  BUN 25* 22 23  CREATININE 1.35* 1.38* 1.43*  CALCIUM 9.5 9.0 9.4  GFRNONAA 37* 36* 34*  GFRAA 43* 42* 40*  PROT 7.2 7.3 7.2  ALBUMIN 4.0 4.0 4.1  AST 19 19 19   ALT 11 13 12   ALKPHOS 66 78 73  BILITOT 0.6 0.7 0.8     No results found.  ASSESSMENT & PLAN:   Essential thrombocytosis ([facility]) #Essential thrombocytosis-CALR positive; Intermediate risk given her age; on hydrea 500 mg/day; tolerating well.   #Platelets today 450; Hemoglobin slightly low at 12.2 white count normal. Continue hydrea 500mg /day.    # A.fib- on xarelto; hold off aspirin.  stable  #CKD stage III-STABLE.  #Elevated blood pressure-again discussed the  importance of close monitoring of blood pressure; recommend checking blood pressure at home.   # DISPOSITION:  # follow up in 4 month-MD-labs; cbc/cmp- Dr.B  Cc: Drs. Baboff/Golan    Cammie Sickle, MD 09/27/2019 7:41 AM

## 2019-09-26 NOTE — Assessment & Plan Note (Addendum)
#  Essential thrombocytosis-CALR positive; Intermediate risk given her age; on hydrea 500 mg/day; tolerating well.   #Platelets today 450; Hemoglobin slightly low at 12.2 white count normal. Continue hydrea 500mg /day.    # A.fib- on xarelto; hold off aspirin.  stable  #CKD stage III-STABLE.  #Elevated blood pressure-again discussed the importance of close monitoring of blood pressure; recommend checking blood pressure at home.   # DISPOSITION:  # follow up in 4 month-MD-labs; cbc/cmp- Dr.B  Cc: Drs. Baboff/Golan

## 2019-09-29 ENCOUNTER — Other Ambulatory Visit: Payer: Self-pay | Admitting: Cardiovascular Disease

## 2019-10-01 ENCOUNTER — Other Ambulatory Visit: Payer: Self-pay

## 2019-10-01 MED ORDER — DILTIAZEM HCL ER COATED BEADS 240 MG PO CP24: 240.0000 mg | ORAL_CAPSULE | Freq: Every day | ORAL | 0 refills | Status: DC

## 2019-10-01 NOTE — Telephone Encounter (Signed)
°*  STAT* If patient is at the pharmacy, call can be transferred to refill team.   1. Which medications need to be refilled? (please list name of each medication and dose if known)  diltiazem 240 MG 1 capsule daily   2. Which pharmacy/location (including street and city if local pharmacy) is medication to be sent to? [facility] in [place]   3. Do they need a 30 day or 90 day supply? 90 day

## 2019-12-22 ENCOUNTER — Other Ambulatory Visit: Payer: Self-pay | Admitting: Cardiovascular Disease

## 2020-01-23 ENCOUNTER — Inpatient Hospital Stay (HOSPITAL_BASED_OUTPATIENT_CLINIC_OR_DEPARTMENT_OTHER): Payer: Medicare HMO | Admitting: Internal Medicine

## 2020-01-23 ENCOUNTER — Other Ambulatory Visit: Payer: Self-pay

## 2020-01-23 ENCOUNTER — Inpatient Hospital Stay: Payer: Medicare HMO | Attending: Internal Medicine

## 2020-01-23 ENCOUNTER — Encounter: Payer: Self-pay | Admitting: Internal Medicine

## 2020-01-23 DIAGNOSIS — N183 Chronic kidney disease, stage 3 unspecified: Secondary | ICD-10-CM | POA: Insufficient documentation

## 2020-01-23 DIAGNOSIS — R197 Diarrhea, unspecified: Secondary | ICD-10-CM | POA: Diagnosis not present

## 2020-01-23 DIAGNOSIS — E785 Hyperlipidemia, unspecified: Secondary | ICD-10-CM | POA: Diagnosis not present

## 2020-01-23 DIAGNOSIS — D473 Essential (hemorrhagic) thrombocythemia: Secondary | ICD-10-CM

## 2020-01-23 DIAGNOSIS — I129 Hypertensive chronic kidney disease with stage 1 through stage 4 chronic kidney disease, or unspecified chronic kidney disease: Secondary | ICD-10-CM | POA: Diagnosis not present

## 2020-01-23 DIAGNOSIS — I482 Chronic atrial fibrillation, unspecified: Secondary | ICD-10-CM | POA: Insufficient documentation

## 2020-01-23 DIAGNOSIS — Z7901 Long term (current) use of anticoagulants: Secondary | ICD-10-CM | POA: Insufficient documentation

## 2020-01-23 DIAGNOSIS — R5383 Other fatigue: Secondary | ICD-10-CM | POA: Diagnosis not present

## 2020-01-23 DIAGNOSIS — R5381 Other malaise: Secondary | ICD-10-CM | POA: Insufficient documentation

## 2020-01-23 DIAGNOSIS — Z79899 Other long term (current) drug therapy: Secondary | ICD-10-CM | POA: Diagnosis not present

## 2020-01-23 DIAGNOSIS — Z87891 Personal history of nicotine dependence: Secondary | ICD-10-CM | POA: Diagnosis not present

## 2020-01-23 LAB — COMPREHENSIVE METABOLIC PANEL
ALT: 13 U/L (ref 0–44)
AST: 18 U/L (ref 15–41)
Albumin: 3.9 g/dL (ref 3.5–5.0)
Alkaline Phosphatase: 75 U/L (ref 38–126)
Anion gap: 8 (ref 5–15)
BUN: 21 mg/dL (ref 8–23)
CO2: 26 mmol/L (ref 22–32)
Calcium: 9 mg/dL (ref 8.9–10.3)
Chloride: 103 mmol/L (ref 98–111)
Creatinine, Ser: 1.41 mg/dL — ABNORMAL HIGH (ref 0.44–1.00)
GFR calc Af Amer: 40 mL/min — ABNORMAL LOW (ref >60)
GFR calc non Af Amer: 35 mL/min — ABNORMAL LOW (ref >60)
Glucose, Bld: 145 mg/dL — ABNORMAL HIGH (ref 70–99)
Potassium: 3.9 mmol/L (ref 3.5–5.1)
Sodium: 137 mmol/L (ref 135–145)
Total Bilirubin: 0.6 mg/dL (ref 0.3–1.2)
Total Protein: 7.1 g/dL (ref 6.5–8.1)

## 2020-01-23 LAB — CBC WITH DIFFERENTIAL/PLATELET
Abs Immature Granulocytes: 0.04 10*3/uL (ref 0.00–0.07)
Basophils Absolute: 0 10*3/uL (ref 0.0–0.1)
Basophils Relative: 0 %
Eosinophils Absolute: 0 10*3/uL (ref 0.0–0.5)
Eosinophils Relative: 1 %
HCT: 32.1 % — ABNORMAL LOW (ref 36.0–46.0)
Hemoglobin: 11.2 g/dL — ABNORMAL LOW (ref 12.0–15.0)
Immature Granulocytes: 1 %
Lymphocytes Relative: 21 %
Lymphs Abs: 1 10*3/uL (ref 0.7–4.0)
MCH: 38.9 pg — ABNORMAL HIGH (ref 26.0–34.0)
MCHC: 34.9 g/dL (ref 30.0–36.0)
MCV: 111.5 fL — ABNORMAL HIGH (ref 80.0–100.0)
Monocytes Absolute: 0.6 10*3/uL (ref 0.1–1.0)
Monocytes Relative: 13 %
Neutro Abs: 2.9 10*3/uL (ref 1.7–7.7)
Neutrophils Relative %: 64 %
Platelets: 369 10*3/uL (ref 150–400)
RBC: 2.88 MIL/uL — ABNORMAL LOW (ref 3.87–5.11)
RDW: 13.2 % (ref 11.5–15.5)
WBC: 4.5 10*3/uL (ref 4.0–10.5)
nRBC: 0 % (ref 0.0–0.2)

## 2020-01-23 NOTE — Assessment & Plan Note (Addendum)
#  Essential thrombocytosis-CALR positive; Intermediate risk given her age; on hydrea 500 mg/day; tolerating well.   #Platelets today 369; Hemoglobin slightly low at 12.2 white count normal. Continue hydrea 500mg /day.  STABLE.   # A.fib- on xarelto; hold off aspirin.STABLE  #CKD stage III-[GFR-40s] STABLE.   # Elevated blood pressure-Improved- 120s/70s.  Discussed compliance with medications.  # DISPOSITION:  # follow up in 4 month-MD-labs; cbc/cmp- Dr.B

## 2020-01-23 NOTE — Progress Notes (Signed)
[facility] CONSULT NOTE  Patient Care Team: Derinda Late, MD as PCP - General (Family Medicine) Rockey Situ Kathlene November, MD as PCP - Cardiology (Cardiology) Minna Merritts, MD as Consulting Physician (Cardiology) Cammie Sickle, MD as Consulting Physician (Internal Medicine)  CHIEF COMPLAINTS/PURPOSE OF CONSULTATION: Thrombocytosis  Oncology History Overview Note  # ESSENTIAL THROMBOCYTOSIS [[June 2019- 684; July 2020- platelets-992 ; Hb-12 white count- 9];INTERMEDIATE RISK;July 2020-; CALR MUTATION POSITIVE; July 2020- Hydrea 500 mg/day.   # A.fib on xarelto [A.fib]  # Chronic Kidney disease-III   Essential thrombocytosis ([place])  01/30/2019 Initial Diagnosis   Essential thrombocytosis ([place])       HISTORY OF PRESENTING ILLNESS:  Paula Clark 79 y.o.  female pleasant patient with essential thrombocytosis currently on Hydrea is here for follow-up.  Patient admits to mild diarrhea 1-2 loose stools every other day.  Denies any abdominal pain.  No weight loss.  No headaches.  No falls.  No skin rash.  No strokes.   Review of Systems  Constitutional: Positive for malaise/fatigue. Negative for chills, diaphoresis, fever and weight loss.  HENT: Negative for nosebleeds and sore throat.   Eyes: Negative for double vision.  Respiratory: Negative for cough, hemoptysis, sputum production, shortness of breath and wheezing.   Cardiovascular: Negative for chest pain, palpitations, orthopnea and leg swelling.  Gastrointestinal: Negative for abdominal pain, blood in stool, constipation, diarrhea, heartburn, melena, nausea and vomiting.  Genitourinary: Negative for dysuria, frequency and urgency.  Musculoskeletal: Positive for joint pain. Negative for back pain.  Skin: Negative.  Negative for itching and rash.  Neurological: Negative for dizziness, tingling, focal weakness, weakness and headaches.  Endo/Heme/Allergies: Does not bruise/bleed easily.   Psychiatric/Behavioral: Negative for depression. The patient is not nervous/anxious and does not have insomnia.      MEDICAL HISTORY:  Past Medical History:  Diagnosis Date  . Chronic atrial fibrillation ([place])    a. Unsuccessful DCCV 05/2016; b. CHADS2VASc => 4 (HTN, age x 2, female)--> Xarelto.  . CKD (chronic kidney disease), stage III   . Epistaxis   . Gout   . History of stress test    a. 04/2016 MV: EF 80%, no ischemia.  Marland Kitchen HLD (hyperlipidemia)   . Hypertension   . Mitral regurgitation    a. Echo 04/2016: EF 60-65%, no RWMA, mild AI, mild MR, mild biatrial enlargement, PASP 32 mmHg, trivial pericardial effusion noted posterior to the heart  . Syncope     SURGICAL HISTORY: Past Surgical History:  Procedure Laterality Date  . ABDOMINAL HYSTERECTOMY    . APPENDECTOMY    . ELECTROPHYSIOLOGIC STUDY N/A 05/18/2016   Procedure: CARDIOVERSION;  Surgeon: Minna Merritts, MD;  Location: [facility] ORS;  Service: Cardiovascular;  Laterality: N/A;  . OOPHORECTOMY    . TONSILLECTOMY    . TOTAL VAGINAL HYSTERECTOMY      SOCIAL HISTORY: Social History   Socioeconomic History  . Marital status: Widowed    Spouse name: Not on file  . Number of children: Not on file  . Years of education: Not on file  . Highest education level: Not on file  Occupational History  . Not on file  Tobacco Use  . Smoking status: Former Research scientist (life sciences)  . Smokeless tobacco: Never Used  Substance and Sexual Activity  . Alcohol use: No  . Drug use: No  . Sexual activity: Not on file  Other Topics Concern  . Not on file  Social History Narrative   [name]; self; remote smoking; No alcohol;  worked in [place] retd.    Social Determinants of Health   Financial Resource Strain:   . Difficulty of Paying Living Expenses:   Food Insecurity:   . Worried About Charity fundraiser in the Last Year:   . Arboriculturist in the Last Year:   Transportation Needs:   . Film/video editor (Medical):   Marland Kitchen  Lack of Transportation (Non-Medical):   Physical Activity:   . Days of Exercise per Week:   . Minutes of Exercise per Session:   Stress:   . Feeling of Stress :   Social Connections:   . Frequency of Communication with Friends and Family:   . Frequency of Social Gatherings with Friends and Family:   . Attends Religious Services:   . Active Member of Clubs or Organizations:   . Attends Archivist Meetings:   Marland Kitchen Marital Status:   Intimate Partner Violence:   . Fear of Current or Ex-Partner:   . Emotionally Abused:   Marland Kitchen Physically Abused:   . Sexually Abused:     FAMILY HISTORY: Family History  Problem Relation Age of Onset  . Heart failure Mother   . Atrial fibrillation Brother     ALLERGIES:  is allergic to codeine.  MEDICATIONS:  Current Outpatient Medications  Medication Sig Dispense Refill  . acetaminophen (TYLENOL) 500 MG tablet Take 500-1,000 mg by mouth every 6 (six) hours as needed for moderate pain or headache.    . cetirizine (ZYRTEC) 10 MG tablet Take 10 mg by mouth daily.    . Cholecalciferol (VITAMIN D3) 1000 units CAPS Take 1,000 Units by mouth daily.    . Cholecalciferol-Vitamin C (VITAMIN D3-VITAMIN C) 1000-500 UNIT-MG CAPS Take 1,000 Units by mouth.    . diltiazem (CARDIZEM CD) 240 MG 24 hr capsule TAKE 1 CAPSULE BY MOUTH EVERY DAY 90 capsule 0  . doxazosin (CARDURA) 8 MG tablet Take 8 mg by mouth daily.     . furosemide (LASIX) 20 MG tablet Take 1 tablet (20 mg total) by mouth daily as needed. 30 tablet 3  . hydroxyurea (HYDREA) 500 MG capsule TAKE 1 CAPSULE (500 MG TOTAL) BY MOUTH DAILY. MAY TAKE WITH FOOD TO MINIMIZE GI SIDE EFFECTS. 90 capsule 1  . loperamide (IMODIUM A-D) 2 MG capsule Take 2 mg by mouth as needed for diarrhea or loose stools.    Alveda Reasons 15 MG TABS tablet TAKE 1 TABLET (15 MG TOTAL) BY MOUTH DAILY WITH SUPPER. 90 tablet 1   No current facility-administered medications for this visit.     PHYSICAL EXAMINATION:   Vitals:    01/23/20 1436  BP: 124/68  Pulse: 64  Resp: 18  Temp: (!) 97.4 F (36.3 C)  SpO2: 98%   Filed Weights   01/23/20 1436  Weight: 147 lb (66.7 kg)    Physical Exam HENT:     Head: Normocephalic and atraumatic.     Mouth/Throat:     Pharynx: No oropharyngeal exudate.  Eyes:     Pupils: Pupils are equal, round, and reactive to light.  Cardiovascular:     Rate and Rhythm: Normal rate and regular rhythm.  Pulmonary:     Effort: Pulmonary effort is normal. No respiratory distress.     Breath sounds: Normal breath sounds. No wheezing.  Abdominal:     General: Bowel sounds are normal. There is no distension.     Palpations: Abdomen is soft. There is no mass.     Tenderness: There  is no abdominal tenderness. There is no guarding or rebound.  Musculoskeletal:        General: No tenderness. Normal range of motion.     Cervical back: Normal range of motion and neck supple.  Skin:    General: Skin is warm.  Neurological:     Mental Status: She is alert and oriented to person, place, and time.  Psychiatric:        Mood and Affect: Affect normal.      LABORATORY DATA:  I have reviewed the data as listed Lab Results  Component Value Date   WBC 4.5 01/23/2020   HGB 11.2 (L) 01/23/2020   HCT 32.1 (L) 01/23/2020   MCV 111.5 (H) 01/23/2020   PLT 369 01/23/2020   Recent Labs    05/29/19 1414 09/26/19 1414 01/23/20 1418  NA 135 138 137  K 4.0 3.9 3.9  CL 104 104 103  CO2 25 26 26   GLUCOSE 125* 117* 145*  BUN 22 23 21   CREATININE 1.38* 1.43* 1.41*  CALCIUM 9.0 9.4 9.0  GFRNONAA 36* 34* 35*  GFRAA 42* 40* 40*  PROT 7.3 7.2 7.1  ALBUMIN 4.0 4.1 3.9  AST 19 19 18   ALT 13 12 13   ALKPHOS 78 73 75  BILITOT 0.7 0.8 0.6     No results found.  ASSESSMENT & PLAN:   Essential thrombocytosis ([facility]) #Essential thrombocytosis-CALR positive; Intermediate risk given her age; on hydrea 500 mg/day; tolerating well.   #Platelets today 369; Hemoglobin slightly low at 12.2 white  count normal. Continue hydrea 500mg /day.  STABLE.   # A.fib- on xarelto; hold off aspirin.STABLE  #CKD stage III-[GFR-40s] STABLE.   # Elevated blood pressure-Improved- 120s/70s.  Discussed compliance with medications.  # DISPOSITION:  # follow up in 4 month-MD-labs; cbc/cmp- Dr.B      Cammie Sickle, MD 01/23/2020 8:20 PM

## 2020-03-14 ENCOUNTER — Other Ambulatory Visit: Payer: Self-pay | Admitting: Internal Medicine

## 2020-03-15 ENCOUNTER — Other Ambulatory Visit: Payer: Self-pay | Admitting: Cardiovascular Disease

## 2020-03-15 DIAGNOSIS — I4819 Other persistent atrial fibrillation: Secondary | ICD-10-CM

## 2020-03-18 NOTE — Telephone Encounter (Signed)
Refill request

## 2020-03-19 ENCOUNTER — Other Ambulatory Visit: Payer: Self-pay | Admitting: Cardiovascular Disease

## 2020-05-26 ENCOUNTER — Other Ambulatory Visit: Payer: Self-pay

## 2020-05-26 DIAGNOSIS — D473 Essential (hemorrhagic) thrombocythemia: Secondary | ICD-10-CM

## 2020-05-26 DIAGNOSIS — D75839 Thrombocytosis, unspecified: Secondary | ICD-10-CM

## 2020-05-27 ENCOUNTER — Inpatient Hospital Stay (HOSPITAL_BASED_OUTPATIENT_CLINIC_OR_DEPARTMENT_OTHER): Payer: Medicare HMO | Admitting: Internal Medicine

## 2020-05-27 ENCOUNTER — Inpatient Hospital Stay: Payer: Medicare HMO | Attending: Internal Medicine

## 2020-05-27 ENCOUNTER — Other Ambulatory Visit: Payer: Self-pay

## 2020-05-27 DIAGNOSIS — D473 Essential (hemorrhagic) thrombocythemia: Secondary | ICD-10-CM

## 2020-05-27 DIAGNOSIS — Z7901 Long term (current) use of anticoagulants: Secondary | ICD-10-CM | POA: Diagnosis not present

## 2020-05-27 DIAGNOSIS — N183 Chronic kidney disease, stage 3 unspecified: Secondary | ICD-10-CM | POA: Diagnosis not present

## 2020-05-27 DIAGNOSIS — D75839 Thrombocytosis, unspecified: Secondary | ICD-10-CM

## 2020-05-27 DIAGNOSIS — I10 Essential (primary) hypertension: Secondary | ICD-10-CM | POA: Insufficient documentation

## 2020-05-27 DIAGNOSIS — Z8249 Family history of ischemic heart disease and other diseases of the circulatory system: Secondary | ICD-10-CM | POA: Diagnosis not present

## 2020-05-27 DIAGNOSIS — Z87891 Personal history of nicotine dependence: Secondary | ICD-10-CM | POA: Insufficient documentation

## 2020-05-27 DIAGNOSIS — E785 Hyperlipidemia, unspecified: Secondary | ICD-10-CM | POA: Diagnosis not present

## 2020-05-27 DIAGNOSIS — Z79899 Other long term (current) drug therapy: Secondary | ICD-10-CM | POA: Diagnosis not present

## 2020-05-27 DIAGNOSIS — I482 Chronic atrial fibrillation, unspecified: Secondary | ICD-10-CM | POA: Insufficient documentation

## 2020-05-27 LAB — CBC WITH DIFFERENTIAL/PLATELET
Abs Immature Granulocytes: 0.03 10*3/uL (ref 0.00–0.07)
Basophils Absolute: 0 10*3/uL (ref 0.0–0.1)
Basophils Relative: 1 %
Eosinophils Absolute: 0.1 10*3/uL (ref 0.0–0.5)
Eosinophils Relative: 1 %
HCT: 32.5 % — ABNORMAL LOW (ref 36.0–46.0)
Hemoglobin: 11.2 g/dL — ABNORMAL LOW (ref 12.0–15.0)
Immature Granulocytes: 1 %
Lymphocytes Relative: 26 %
Lymphs Abs: 1.4 10*3/uL (ref 0.7–4.0)
MCH: 38.8 pg — ABNORMAL HIGH (ref 26.0–34.0)
MCHC: 34.5 g/dL (ref 30.0–36.0)
MCV: 112.5 fL — ABNORMAL HIGH (ref 80.0–100.0)
Monocytes Absolute: 0.7 10*3/uL (ref 0.1–1.0)
Monocytes Relative: 13 %
Neutro Abs: 3.2 10*3/uL (ref 1.7–7.7)
Neutrophils Relative %: 58 %
Platelets: 471 10*3/uL — ABNORMAL HIGH (ref 150–400)
RBC: 2.89 MIL/uL — ABNORMAL LOW (ref 3.87–5.11)
RDW: 12.4 % (ref 11.5–15.5)
WBC: 5.4 10*3/uL (ref 4.0–10.5)
nRBC: 0 % (ref 0.0–0.2)

## 2020-05-27 LAB — COMPREHENSIVE METABOLIC PANEL
ALT: 11 U/L (ref 0–44)
AST: 17 U/L (ref 15–41)
Albumin: 3.7 g/dL (ref 3.5–5.0)
Alkaline Phosphatase: 68 U/L (ref 38–126)
Anion gap: 8 (ref 5–15)
BUN: 20 mg/dL (ref 8–23)
CO2: 27 mmol/L (ref 22–32)
Calcium: 9.6 mg/dL (ref 8.9–10.3)
Chloride: 103 mmol/L (ref 98–111)
Creatinine, Ser: 1.4 mg/dL — ABNORMAL HIGH (ref 0.44–1.00)
GFR, Estimated: 38 mL/min — ABNORMAL LOW (ref >60)
Glucose, Bld: 137 mg/dL — ABNORMAL HIGH (ref 70–99)
Potassium: 3.5 mmol/L (ref 3.5–5.1)
Sodium: 138 mmol/L (ref 135–145)
Total Bilirubin: 0.7 mg/dL (ref 0.3–1.2)
Total Protein: 7.1 g/dL (ref 6.5–8.1)

## 2020-05-27 MED ORDER — HYDROXYUREA 500 MG PO CAPS
500.0000 mg | ORAL_CAPSULE | Freq: Every day | ORAL | 1 refills | Status: DC
Start: 2020-05-27 — End: 2020-12-15

## 2020-05-27 NOTE — Progress Notes (Signed)
[facility] CONSULT NOTE  Patient Care Team: Derinda Late, MD as PCP - General (Family Medicine) Rockey Situ Kathlene November, MD as PCP - Cardiology (Cardiology) Minna Merritts, MD as Consulting Physician (Cardiology) Cammie Sickle, MD as Consulting Physician (Internal Medicine)  CHIEF COMPLAINTS/PURPOSE OF CONSULTATION: Thrombocytosis  Oncology History Overview Note  # ESSENTIAL THROMBOCYTOSIS [[June 2019- 684; July 2020- platelets-992 ; Hb-12 white count- 9];INTERMEDIATE RISK;July 2020-; CALR MUTATION POSITIVE; July 2020- Hydrea 500 mg/day.   # A.fib on xarelto [A.fib]  # Chronic Kidney disease-III   Essential thrombocytosis ([place])  01/30/2019 Initial Diagnosis   Essential thrombocytosis ([place])       HISTORY OF PRESENTING ILLNESS:  Paula Clark 79 y.o.  female pleasant patient with essential thrombocytosis currently on Hydrea is here for follow-up.  Denies any abdominal pain nausea vomiting pain no weight loss.  No strokes.  No headaches.  1-2 loose stools every other day.  Not any worse.   Review of Systems  Constitutional: Positive for malaise/fatigue. Negative for chills, diaphoresis, fever and weight loss.  HENT: Negative for nosebleeds and sore throat.   Eyes: Negative for double vision.  Respiratory: Negative for cough, hemoptysis, sputum production, shortness of breath and wheezing.   Cardiovascular: Negative for chest pain, palpitations, orthopnea and leg swelling.  Gastrointestinal: Positive for diarrhea. Negative for abdominal pain, blood in stool, constipation, heartburn, melena, nausea and vomiting.  Genitourinary: Negative for dysuria, frequency and urgency.  Musculoskeletal: Positive for joint pain. Negative for back pain.  Skin: Negative.  Negative for itching and rash.  Neurological: Negative for dizziness, tingling, focal weakness, weakness and headaches.  Endo/Heme/Allergies: Does not bruise/bleed easily.  Psychiatric/Behavioral:  Negative for depression. The patient is not nervous/anxious and does not have insomnia.      MEDICAL HISTORY:  Past Medical History:  Diagnosis Date  . Chronic atrial fibrillation ([place])    a. Unsuccessful DCCV 05/2016; b. CHADS2VASc => 4 (HTN, age x 2, female)--> Xarelto.  . CKD (chronic kidney disease), stage III   . Epistaxis   . Gout   . History of stress test    a. 04/2016 MV: EF 80%, no ischemia.  Marland Kitchen HLD (hyperlipidemia)   . Hypertension   . Mitral regurgitation    a. Echo 04/2016: EF 60-65%, no RWMA, mild AI, mild MR, mild biatrial enlargement, PASP 32 mmHg, trivial pericardial effusion noted posterior to the heart  . Syncope     SURGICAL HISTORY: Past Surgical History:  Procedure Laterality Date  . ABDOMINAL HYSTERECTOMY    . APPENDECTOMY    . ELECTROPHYSIOLOGIC STUDY N/A 05/18/2016   Procedure: CARDIOVERSION;  Surgeon: Minna Merritts, MD;  Location: [facility] ORS;  Service: Cardiovascular;  Laterality: N/A;  . OOPHORECTOMY    . TONSILLECTOMY    . TOTAL VAGINAL HYSTERECTOMY      SOCIAL HISTORY: Social History   Socioeconomic History  . Marital status: Widowed    Spouse name: Not on file  . Number of children: Not on file  . Years of education: Not on file  . Highest education level: Not on file  Occupational History  . Not on file  Tobacco Use  . Smoking status: Former Research scientist (life sciences)  . Smokeless tobacco: Never Used  Substance and Sexual Activity  . Alcohol use: No  . Drug use: No  . Sexual activity: Not on file  Other Topics Concern  . Not on file  Social History Narrative   [name]; self; remote smoking; No alcohol; worked in Adult nurse  retd.    Social Determinants of Health   Financial Resource Strain:   . Difficulty of Paying Living Expenses: Not on file  Food Insecurity:   . Worried About Charity fundraiser in the Last Year: Not on file  . Ran Out of Food in the Last Year: Not on file  Transportation Needs:   . Lack of Transportation (Medical):  Not on file  . Lack of Transportation (Non-Medical): Not on file  Physical Activity:   . Days of Exercise per Week: Not on file  . Minutes of Exercise per Session: Not on file  Stress:   . Feeling of Stress : Not on file  Social Connections:   . Frequency of Communication with Friends and Family: Not on file  . Frequency of Social Gatherings with Friends and Family: Not on file  . Attends Religious Services: Not on file  . Active Member of Clubs or Organizations: Not on file  . Attends Archivist Meetings: Not on file  . Marital Status: Not on file  Intimate Partner Violence:   . Fear of Current or Ex-Partner: Not on file  . Emotionally Abused: Not on file  . Physically Abused: Not on file  . Sexually Abused: Not on file    FAMILY HISTORY: Family History  Problem Relation Age of Onset  . Heart failure Mother   . Atrial fibrillation Brother     ALLERGIES:  is allergic to codeine.  MEDICATIONS:  Current Outpatient Medications  Medication Sig Dispense Refill  . acetaminophen (TYLENOL) 500 MG tablet Take 500-1,000 mg by mouth every 6 (six) hours as needed for moderate pain or headache.    . cetirizine (ZYRTEC) 10 MG tablet Take 10 mg by mouth daily.    . Cholecalciferol (VITAMIN D3) 1000 units CAPS Take 1,000 Units by mouth daily.    . Cholecalciferol-Vitamin C (VITAMIN D3-VITAMIN C) 1000-500 UNIT-MG CAPS Take 1,000 Units by mouth.    . diltiazem (CARDIZEM CD) 240 MG 24 hr capsule TAKE 1 CAPSULE BY MOUTH EVERY DAY 90 capsule 2  . doxazosin (CARDURA) 8 MG tablet Take 8 mg by mouth daily.     . furosemide (LASIX) 20 MG tablet Take 1 tablet (20 mg total) by mouth daily as needed. 30 tablet 3  . hydroxyurea (HYDREA) 500 MG capsule Take 1 capsule (500 mg total) by mouth daily. May take with food to minimize GI side effects. 90 capsule 1  . loperamide (IMODIUM A-D) 2 MG capsule Take 2 mg by mouth as needed for diarrhea or loose stools.    Alveda Reasons 15 MG TABS tablet TAKE 1  TABLET (15 MG TOTAL) BY MOUTH DAILY WITH SUPPER. 90 tablet 1   No current facility-administered medications for this visit.     PHYSICAL EXAMINATION:   Vitals:   05/27/20 1442  BP: (!) 168/68  Pulse: (!) 53  Temp: 98.2 F (36.8 C)  SpO2: 100%   Filed Weights   05/27/20 1442  Weight: 147 lb (66.7 kg)    Physical Exam HENT:     Head: Normocephalic and atraumatic.     Mouth/Throat:     Pharynx: No oropharyngeal exudate.  Eyes:     Pupils: Pupils are equal, round, and reactive to light.  Cardiovascular:     Rate and Rhythm: Normal rate and regular rhythm.  Pulmonary:     Effort: Pulmonary effort is normal. No respiratory distress.     Breath sounds: Normal breath sounds. No wheezing.  Abdominal:  General: Bowel sounds are normal. There is no distension.     Palpations: Abdomen is soft. There is no mass.     Tenderness: There is no abdominal tenderness. There is no guarding or rebound.  Musculoskeletal:        General: No tenderness. Normal range of motion.     Cervical back: Normal range of motion and neck supple.  Skin:    General: Skin is warm.  Neurological:     Mental Status: She is alert and oriented to person, place, and time.  Psychiatric:        Mood and Affect: Affect normal.      LABORATORY DATA:  I have reviewed the data as listed Lab Results  Component Value Date   WBC 5.4 05/27/2020   HGB 11.2 (L) 05/27/2020   HCT 32.5 (L) 05/27/2020   MCV 112.5 (H) 05/27/2020   PLT 471 (H) 05/27/2020   Recent Labs    05/29/19 1414 05/29/19 1414 09/26/19 1414 01/23/20 1418 05/27/20 1424  NA 135   < > 138 137 138  K 4.0   < > 3.9 3.9 3.5  CL 104   < > 104 103 103  CO2 25   < > 26 26 27   GLUCOSE 125*   < > 117* 145* 137*  BUN 22   < > 23 21 20   CREATININE 1.38*   < > 1.43* 1.41* 1.40*  CALCIUM 9.0   < > 9.4 9.0 9.6  GFRNONAA 36*   < > 34* 35* 38*  GFRAA 42*  --  40* 40*  --   PROT 7.3   < > 7.2 7.1 7.1  ALBUMIN 4.0   < > 4.1 3.9 3.7  AST 19   <  > 19 18 17   ALT 13   < > 12 13 11   ALKPHOS 78   < > 73 75 68  BILITOT 0.7   < > 0.8 0.6 0.7   < > = values in this interval not displayed.     No results found.  ASSESSMENT & PLAN:   Essential thrombocytosis ([facility]) #Essential thrombocytosis-CALR positive; Intermediate risk given her age; on hydrea 500 mg/day; tolerating well.  No concerns for any transformation.  # Platelets today 471 Hemoglobin slightly low at 11.2 white count normal. Continue hydrea 500mg /day.  STABLE.   #Diarrhea-grade 1 Hydrea.  Monitor for now.  Continue antiemetics as needed.  # A.fib- on xarelto; hold off aspirin.STABLE  #CKD stage III-[GFR-38s] -STABLE.   # Elevated blood pressure-170/70s. Recommend checking BP at home  Discussed compliance with medications.  # DISPOSITION:  # follow up in 4 month-MD-labs; cbc/cmp- Dr.B      Cammie Sickle, MD 05/28/2020 8:34 AM

## 2020-05-27 NOTE — Assessment & Plan Note (Addendum)
#  Essential thrombocytosis-CALR positive; Intermediate risk given her age; on hydrea 500 mg/day; tolerating well.  No concerns for any transformation.  # Platelets today 471 Hemoglobin slightly low at 11.2 white count normal. Continue hydrea 500mg /day.  STABLE.   #Diarrhea-grade 1 Hydrea.  Monitor for now.  Continue antiemetics as needed.  # A.fib- on xarelto; hold off aspirin.STABLE  #CKD stage III-[GFR-38s] -STABLE.   # Elevated blood pressure-170/70s. Recommend checking BP at home  Discussed compliance with medications.  # DISPOSITION:  # follow up in 4 month-MD-labs; cbc/cmp- Dr.B

## 2020-07-16 DIAGNOSIS — N1832 Chronic kidney disease, stage 3b: Secondary | ICD-10-CM | POA: Diagnosis not present

## 2020-07-16 DIAGNOSIS — R739 Hyperglycemia, unspecified: Secondary | ICD-10-CM | POA: Diagnosis not present

## 2020-07-22 IMAGING — MG DIGITAL SCREENING BILATERAL MAMMOGRAM WITH TOMO AND CAD
8 series · 8 of 24 positions shown · non-contrast
Comparison: Previous exam(s).

CLINICAL DATA: Screening.

EXAM:
DIGITAL SCREENING BILATERAL MAMMOGRAM WITH TOMO AND CAD

[R MLO synth-2D]
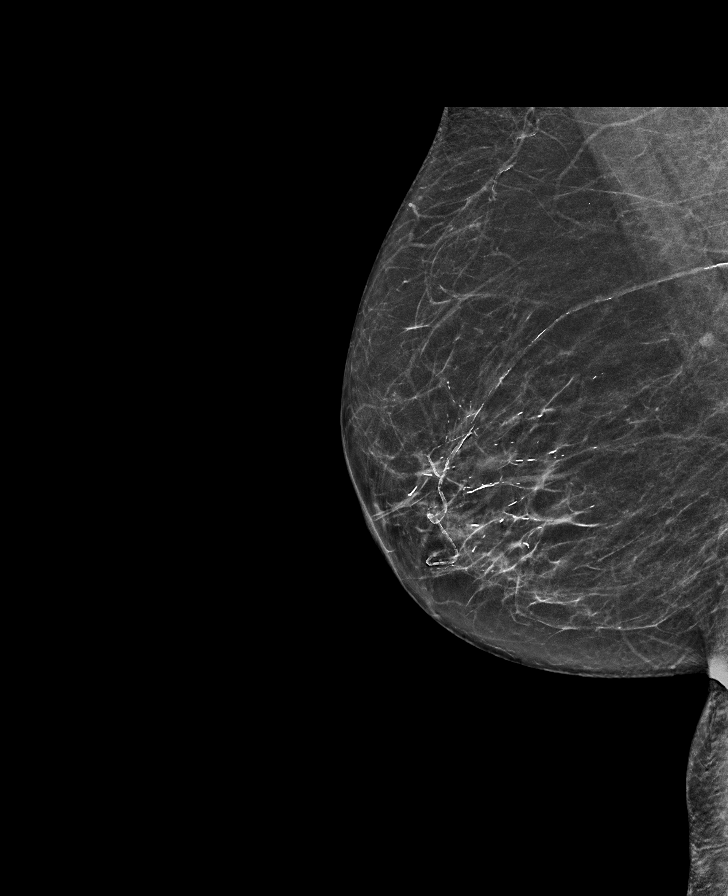

[L CC synth-2D]
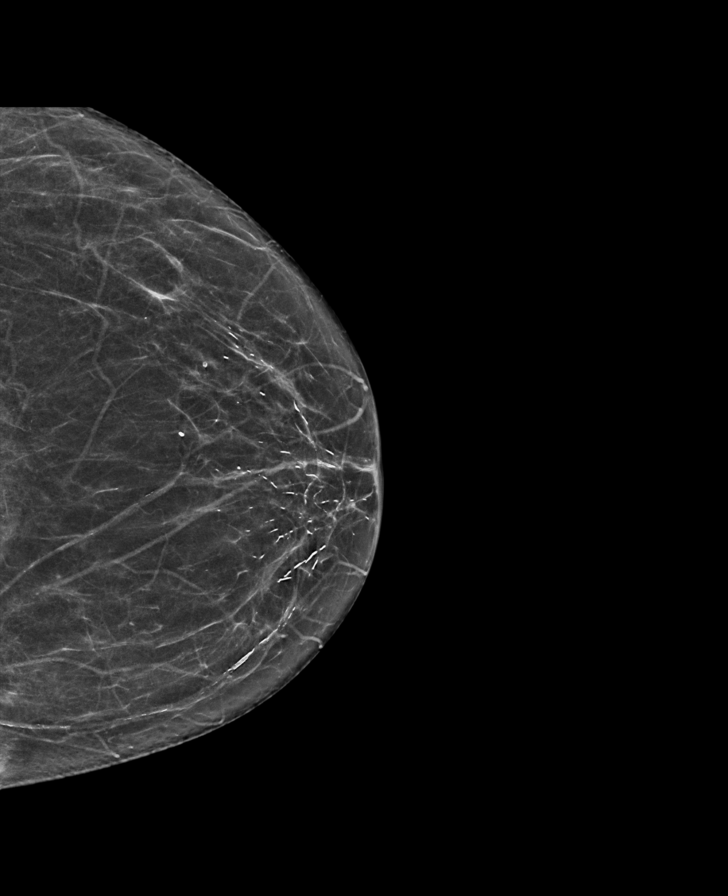

[L MLO synth-2D]
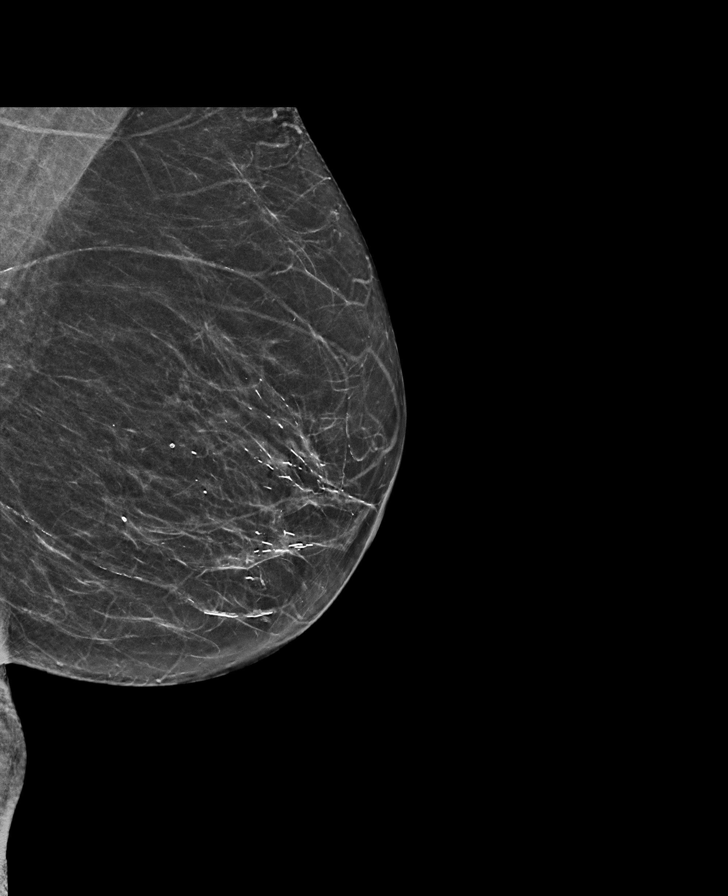

[R CC synth-2D]
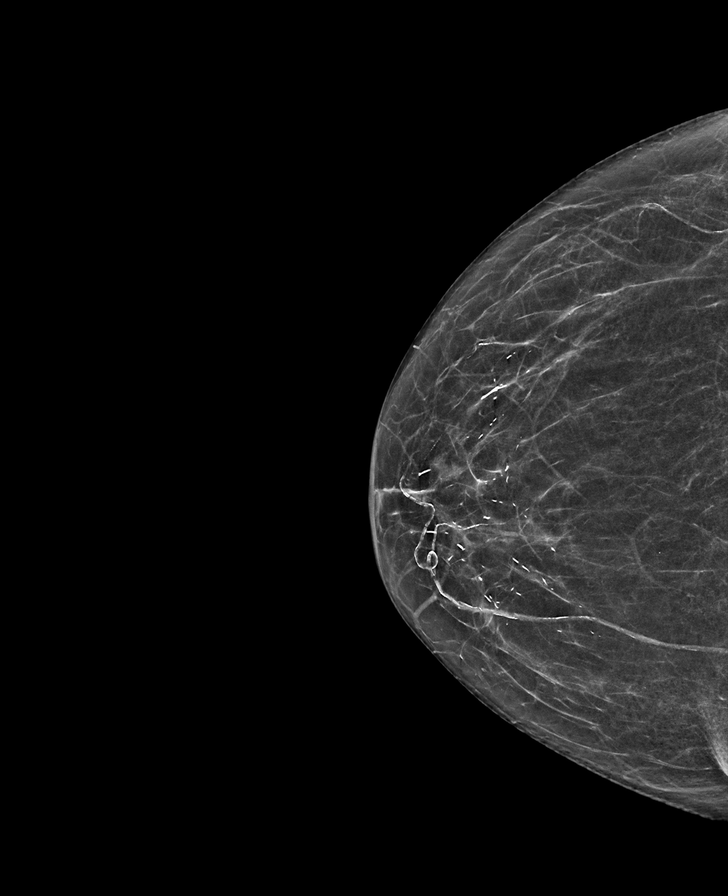

[L CC tomo · tomo slice 31/61.0]
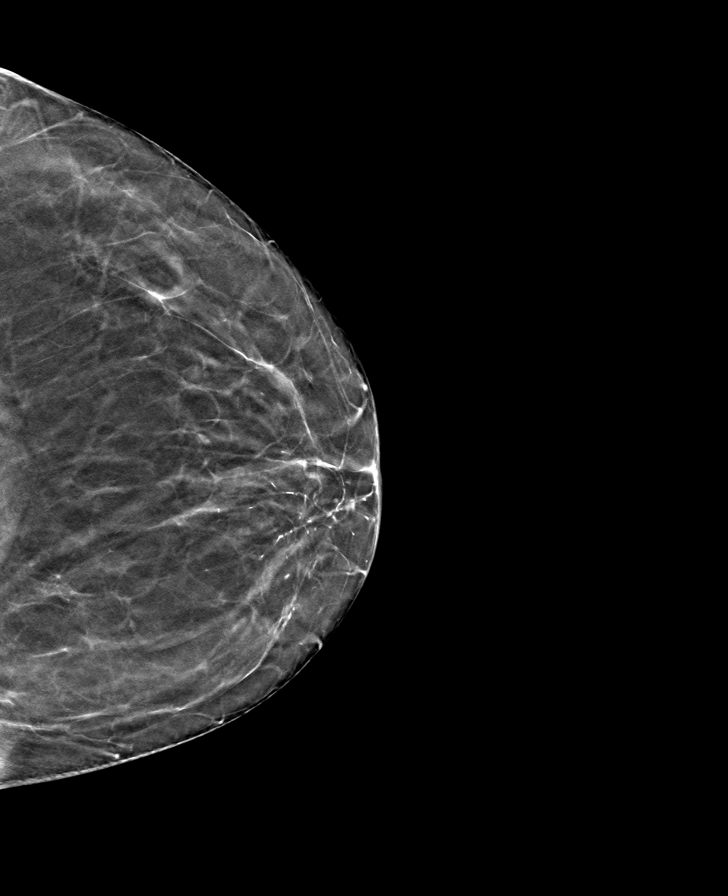

[R CC tomo · tomo slice 31/60.0]
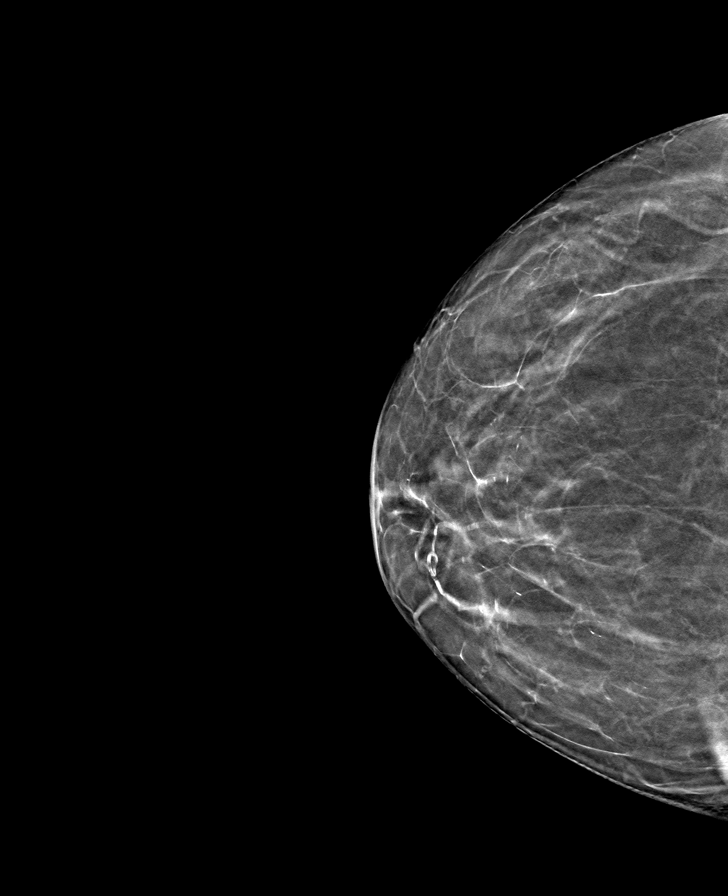

[L MLO tomo · tomo slice 31/62.0]
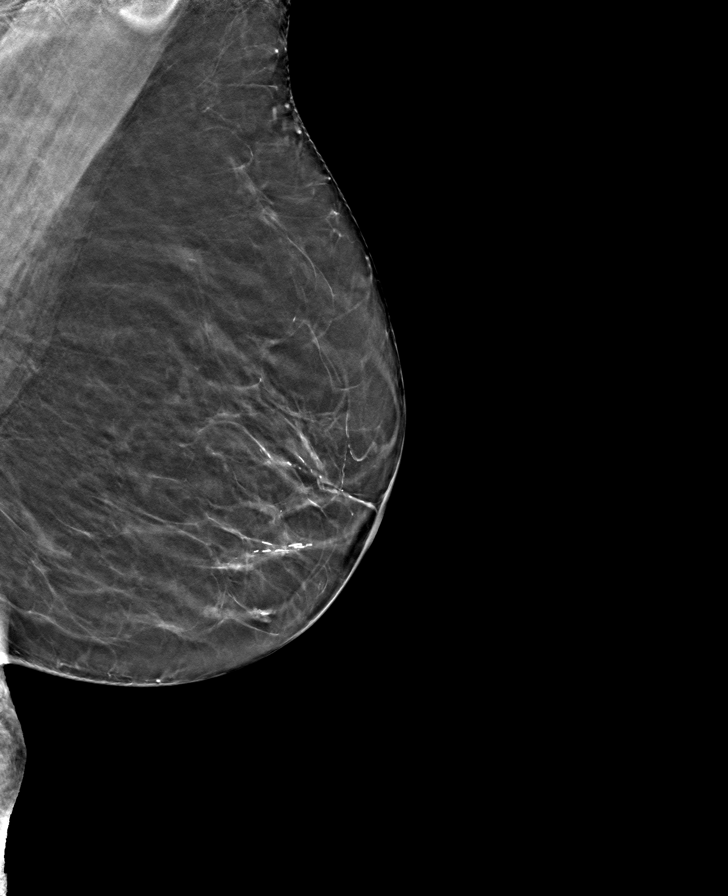

[R MLO tomo · tomo slice 32/63.0]
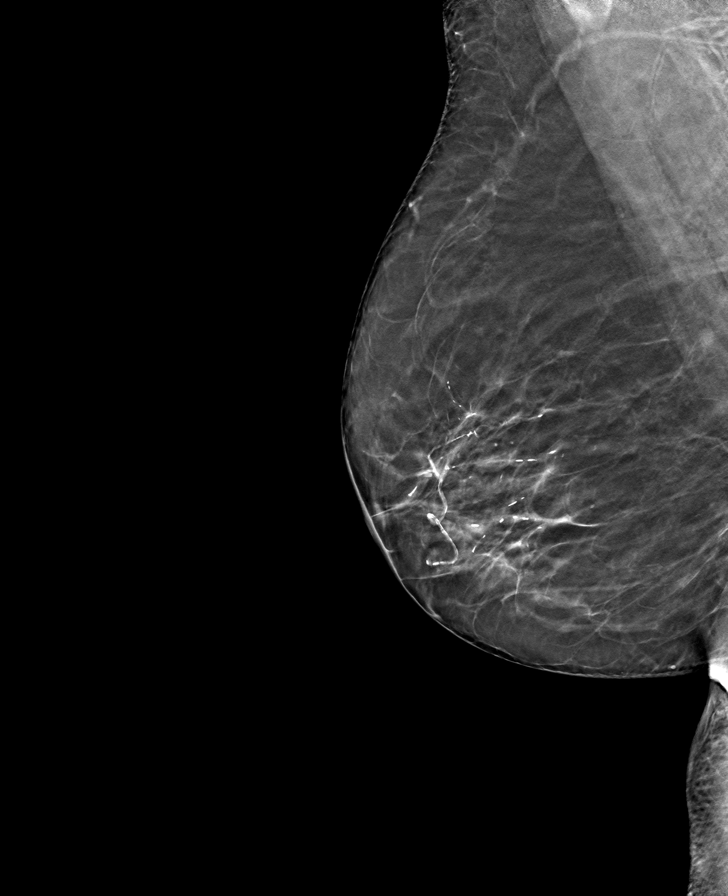

[8 of 24 positions shown; findings below may reference images not displayed]

ACR Breast Density Category b: There are scattered areas of
fibroglandular density.
FINDINGS: There are no findings suspicious for malignancy. Images were
processed with CAD.
IMPRESSION: No mammographic evidence of malignancy. A result letter of this
screening mammogram will be mailed directly to the patient.

RECOMMENDATION:
Screening mammogram in one year. (Code:[id number])

BI-RADS CATEGORY  1: Negative.

## 2020-07-23 DIAGNOSIS — N189 Chronic kidney disease, unspecified: Secondary | ICD-10-CM | POA: Diagnosis not present

## 2020-07-23 DIAGNOSIS — Z23 Encounter for immunization: Secondary | ICD-10-CM | POA: Diagnosis not present

## 2020-07-23 DIAGNOSIS — I4891 Unspecified atrial fibrillation: Secondary | ICD-10-CM | POA: Diagnosis not present

## 2020-07-23 DIAGNOSIS — Z79899 Other long term (current) drug therapy: Secondary | ICD-10-CM | POA: Diagnosis not present

## 2020-07-23 DIAGNOSIS — D75839 Thrombocytosis, unspecified: Secondary | ICD-10-CM | POA: Diagnosis not present

## 2020-08-27 ENCOUNTER — Other Ambulatory Visit: Payer: Self-pay | Admitting: Cardiovascular Disease

## 2020-08-27 DIAGNOSIS — I4819 Other persistent atrial fibrillation: Secondary | ICD-10-CM

## 2020-08-28 NOTE — Telephone Encounter (Signed)
Prescription refill request for Xarelto received.  Indication: Atrial fib Last office visit: 09/25/19 Weight: 67.6kg Age: 80 Scr: 1.40 CrCl: 33.63  Based on above findings Xarelto 15mg  daily (Renal dose) is appropriate.  Refill approved.

## 2020-08-28 NOTE — Telephone Encounter (Signed)
Refill request

## 2020-09-23 ENCOUNTER — Inpatient Hospital Stay (HOSPITAL_BASED_OUTPATIENT_CLINIC_OR_DEPARTMENT_OTHER): Payer: Medicare HMO | Admitting: Internal Medicine

## 2020-09-23 ENCOUNTER — Inpatient Hospital Stay: Payer: Medicare HMO | Attending: Internal Medicine

## 2020-09-23 DIAGNOSIS — Z7901 Long term (current) use of anticoagulants: Secondary | ICD-10-CM | POA: Diagnosis not present

## 2020-09-23 DIAGNOSIS — I482 Chronic atrial fibrillation, unspecified: Secondary | ICD-10-CM | POA: Diagnosis not present

## 2020-09-23 DIAGNOSIS — I10 Essential (primary) hypertension: Secondary | ICD-10-CM | POA: Insufficient documentation

## 2020-09-23 DIAGNOSIS — I4891 Unspecified atrial fibrillation: Secondary | ICD-10-CM | POA: Insufficient documentation

## 2020-09-23 DIAGNOSIS — N183 Chronic kidney disease, stage 3 unspecified: Secondary | ICD-10-CM | POA: Diagnosis not present

## 2020-09-23 DIAGNOSIS — I129 Hypertensive chronic kidney disease with stage 1 through stage 4 chronic kidney disease, or unspecified chronic kidney disease: Secondary | ICD-10-CM | POA: Diagnosis not present

## 2020-09-23 DIAGNOSIS — R197 Diarrhea, unspecified: Secondary | ICD-10-CM | POA: Insufficient documentation

## 2020-09-23 DIAGNOSIS — M109 Gout, unspecified: Secondary | ICD-10-CM | POA: Insufficient documentation

## 2020-09-23 DIAGNOSIS — Z79899 Other long term (current) drug therapy: Secondary | ICD-10-CM | POA: Diagnosis not present

## 2020-09-23 DIAGNOSIS — Z87891 Personal history of nicotine dependence: Secondary | ICD-10-CM | POA: Insufficient documentation

## 2020-09-23 DIAGNOSIS — E785 Hyperlipidemia, unspecified: Secondary | ICD-10-CM | POA: Insufficient documentation

## 2020-09-23 DIAGNOSIS — D473 Essential (hemorrhagic) thrombocythemia: Secondary | ICD-10-CM | POA: Diagnosis not present

## 2020-09-23 DIAGNOSIS — I340 Nonrheumatic mitral (valve) insufficiency: Secondary | ICD-10-CM | POA: Insufficient documentation

## 2020-09-23 LAB — CBC WITH DIFFERENTIAL/PLATELET
Abs Immature Granulocytes: 0.05 10*3/uL (ref 0.00–0.07)
Basophils Absolute: 0 10*3/uL (ref 0.0–0.1)
Basophils Relative: 0 %
Eosinophils Absolute: 0.1 10*3/uL (ref 0.0–0.5)
Eosinophils Relative: 1 %
HCT: 33.5 % — ABNORMAL LOW (ref 36.0–46.0)
Hemoglobin: 11.2 g/dL — ABNORMAL LOW (ref 12.0–15.0)
Immature Granulocytes: 1 %
Lymphocytes Relative: 19 %
Lymphs Abs: 1.3 10*3/uL (ref 0.7–4.0)
MCH: 37.3 pg — ABNORMAL HIGH (ref 26.0–34.0)
MCHC: 33.4 g/dL (ref 30.0–36.0)
MCV: 111.7 fL — ABNORMAL HIGH (ref 80.0–100.0)
Monocytes Absolute: 0.8 10*3/uL (ref 0.1–1.0)
Monocytes Relative: 12 %
Neutro Abs: 4.5 10*3/uL (ref 1.7–7.7)
Neutrophils Relative %: 67 %
Platelets: 501 10*3/uL — ABNORMAL HIGH (ref 150–400)
RBC: 3 MIL/uL — ABNORMAL LOW (ref 3.87–5.11)
RDW: 13 % (ref 11.5–15.5)
WBC: 6.8 10*3/uL (ref 4.0–10.5)
nRBC: 0 % (ref 0.0–0.2)

## 2020-09-23 LAB — COMPREHENSIVE METABOLIC PANEL
ALT: 13 U/L (ref 0–44)
AST: 20 U/L (ref 15–41)
Albumin: 3.8 g/dL (ref 3.5–5.0)
Alkaline Phosphatase: 68 U/L (ref 38–126)
Anion gap: 13 (ref 5–15)
BUN: 23 mg/dL (ref 8–23)
CO2: 22 mmol/L (ref 22–32)
Calcium: 9.2 mg/dL (ref 8.9–10.3)
Chloride: 104 mmol/L (ref 98–111)
Creatinine, Ser: 1.53 mg/dL — ABNORMAL HIGH (ref 0.44–1.00)
GFR, Estimated: 34 mL/min — ABNORMAL LOW (ref >60)
Glucose, Bld: 167 mg/dL — ABNORMAL HIGH (ref 70–99)
Potassium: 3.8 mmol/L (ref 3.5–5.1)
Sodium: 139 mmol/L (ref 135–145)
Total Bilirubin: 0.9 mg/dL (ref 0.3–1.2)
Total Protein: 7.3 g/dL (ref 6.5–8.1)

## 2020-09-23 NOTE — Assessment & Plan Note (Addendum)
#  Essential thrombocytosis-CALR positive; Intermediate risk given her age; on hydrea 500 mg/day; tolerating well.  No concerns for any transformation.  # Platelets today 501; Hemoglobin slightly low at 11.2 white count normal. Continue hydrea 500mg /day.  STABLE.   #Diarrhea-grade 1 Hydrea.  Monitor for now.  Continue antiemetics as needed.  # A.fib- on xarelto; STABLE.   #CKD stage III-[GFR-38s]; on diuretic--STABLE.   # DISPOSITION:  # follow up in 4 month-MD-labs; cbc/cmp- Dr.B

## 2020-09-23 NOTE — Progress Notes (Signed)
[facility] CONSULT NOTE  Patient Care Team: Derinda Late, MD as PCP - General (Family Medicine) Rockey Situ Kathlene November, MD as PCP - Cardiology (Cardiology) Minna Merritts, MD as Consulting Physician (Cardiology) Cammie Sickle, MD as Consulting Physician (Internal Medicine)  CHIEF COMPLAINTS/PURPOSE OF CONSULTATION: Thrombocytosis  Oncology History Overview Note  # ESSENTIAL THROMBOCYTOSIS [[June 2019- 684; July 2020- platelets-992 ; Hb-12 white count- 9];INTERMEDIATE RISK;July 2020-; CALR MUTATION POSITIVE; July 2020- Hydrea 500 mg/day.   # A.fib on xarelto [A.fib]  # Chronic Kidney disease-III   Essential thrombocytosis ([place])  01/30/2019 Initial Diagnosis   Essential thrombocytosis ([place])       HISTORY OF PRESENTING ILLNESS:  Paula Clark 80 y.o.  female pleasant patient with essential thrombocytosis currently on Hydrea is here for follow-up.  Patient denies any strokes.  Denies any headaches.  Denies any nausea vomiting abdominal pain.  No weight loss.  No skin rash.  1-2 loose stools every other day not any worse.  Review of Systems  Constitutional: Positive for malaise/fatigue. Negative for chills, diaphoresis, fever and weight loss.  HENT: Negative for nosebleeds and sore throat.   Eyes: Negative for double vision.  Respiratory: Negative for cough, hemoptysis, sputum production, shortness of breath and wheezing.   Cardiovascular: Negative for chest pain, palpitations, orthopnea and leg swelling.  Gastrointestinal: Positive for diarrhea. Negative for abdominal pain, blood in stool, constipation, heartburn, melena, nausea and vomiting.  Genitourinary: Negative for dysuria, frequency and urgency.  Musculoskeletal: Positive for joint pain. Negative for back pain.  Skin: Negative.  Negative for itching and rash.  Neurological: Negative for dizziness, tingling, focal weakness, weakness and headaches.  Endo/Heme/Allergies: Does not bruise/bleed easily.   Psychiatric/Behavioral: Negative for depression. The patient is not nervous/anxious and does not have insomnia.      MEDICAL HISTORY:  Past Medical History:  Diagnosis Date  . Chronic atrial fibrillation ([place])    a. Unsuccessful DCCV 05/2016; b. CHADS2VASc => 4 (HTN, age x 2, female)--> Xarelto.  . CKD (chronic kidney disease), stage III   . Epistaxis   . Gout   . History of stress test    a. 04/2016 MV: EF 80%, no ischemia.  Marland Kitchen HLD (hyperlipidemia)   . Hypertension   . Mitral regurgitation    a. Echo 04/2016: EF 60-65%, no RWMA, mild AI, mild MR, mild biatrial enlargement, PASP 32 mmHg, trivial pericardial effusion noted posterior to the heart  . Syncope     SURGICAL HISTORY: Past Surgical History:  Procedure Laterality Date  . ABDOMINAL HYSTERECTOMY    . APPENDECTOMY    . ELECTROPHYSIOLOGIC STUDY N/A 05/18/2016   Procedure: CARDIOVERSION;  Surgeon: Minna Merritts, MD;  Location: [facility] ORS;  Service: Cardiovascular;  Laterality: N/A;  . OOPHORECTOMY    . TONSILLECTOMY    . TOTAL VAGINAL HYSTERECTOMY      SOCIAL HISTORY: Social History   Socioeconomic History  . Marital status: Widowed    Spouse name: Not on file  . Number of children: Not on file  . Years of education: Not on file  . Highest education level: Not on file  Occupational History  . Not on file  Tobacco Use  . Smoking status: Former Research scientist (life sciences)  . Smokeless tobacco: Never Used  Substance and Sexual Activity  . Alcohol use: No  . Drug use: No  . Sexual activity: Not on file  Other Topics Concern  . Not on file  Social History Narrative   [name]; self; remote smoking; No  alcohol; worked in [place] retd.    Social Determinants of Health   Financial Resource Strain: Not on file  Food Insecurity: Not on file  Transportation Needs: Not on file  Physical Activity: Not on file  Stress: Not on file  Social Connections: Not on file  Intimate Partner Violence: Not on file    FAMILY  HISTORY: Family History  Problem Relation Age of Onset  . Heart failure Mother   . Atrial fibrillation Brother     ALLERGIES:  is allergic to codeine.  MEDICATIONS:  Current Outpatient Medications  Medication Sig Dispense Refill  . acetaminophen (TYLENOL) 500 MG tablet Take 500-1,000 mg by mouth every 6 (six) hours as needed for moderate pain or headache.    . cetirizine (ZYRTEC) 10 MG tablet Take 10 mg by mouth daily.    . Cholecalciferol (VITAMIN D3) 1000 units CAPS Take 1,000 Units by mouth daily.    . Cholecalciferol-Vitamin C (VITAMIN D3-VITAMIN C) 1000-500 UNIT-MG CAPS Take 1,000 Units by mouth.    . colchicine 0.6 MG tablet Take 2 tablets (1.2mg ) by mouth at first sign of gout flare followed by 1 tablet (0.6mg ) after 1 hour. (Max 1.8mg  within 1 hour)    . diltiazem (CARDIZEM CD) 240 MG 24 hr capsule TAKE 1 CAPSULE BY MOUTH EVERY DAY 90 capsule 2  . doxazosin (CARDURA) 8 MG tablet Take 8 mg by mouth daily.     . furosemide (LASIX) 20 MG tablet Take 1 tablet (20 mg total) by mouth daily as needed. 30 tablet 3  . hydroxyurea (HYDREA) 500 MG capsule Take 1 capsule (500 mg total) by mouth daily. May take with food to minimize GI side effects. 90 capsule 1  . loperamide (IMODIUM) 2 MG capsule Take 2 mg by mouth as needed for diarrhea or loose stools.    Alveda Reasons 15 MG TABS tablet TAKE 1 TABLET (15 MG TOTAL) BY MOUTH DAILY WITH SUPPER. 90 tablet 1   No current facility-administered medications for this visit.     PHYSICAL EXAMINATION:   Vitals:   09/23/20 1442  BP: (!) 128/49  Pulse: (!) 55  Temp: 98.4 F (36.9 C)  SpO2: 98%   Filed Weights   09/23/20 1442  Weight: 145 lb (65.8 kg)    Physical Exam HENT:     Head: Normocephalic and atraumatic.     Mouth/Throat:     Pharynx: No oropharyngeal exudate.  Eyes:     Pupils: Pupils are equal, round, and reactive to light.  Cardiovascular:     Rate and Rhythm: Normal rate and regular rhythm.  Pulmonary:     Effort:  Pulmonary effort is normal. No respiratory distress.     Breath sounds: Normal breath sounds. No wheezing.  Abdominal:     General: Bowel sounds are normal. There is no distension.     Palpations: Abdomen is soft. There is no mass.     Tenderness: There is no abdominal tenderness. There is no guarding or rebound.  Musculoskeletal:        General: No tenderness. Normal range of motion.     Cervical back: Normal range of motion and neck supple.  Skin:    General: Skin is warm.  Neurological:     Mental Status: She is alert and oriented to person, place, and time.  Psychiatric:        Mood and Affect: Affect normal.      LABORATORY DATA:  I have reviewed the data as listed Lab  Results  Component Value Date   WBC 6.8 09/23/2020   HGB 11.2 (L) 09/23/2020   HCT 33.5 (L) 09/23/2020   MCV 111.7 (H) 09/23/2020   PLT 501 (H) 09/23/2020   Recent Labs    09/26/19 1414 01/23/20 1418 05/27/20 1424 09/23/20 1423  NA 138 137 138 139  K 3.9 3.9 3.5 3.8  CL 104 103 103 104  CO2 26 26 27 22   GLUCOSE 117* 145* 137* 167*  BUN 23 21 20 23   CREATININE 1.43* 1.41* 1.40* 1.53*  CALCIUM 9.4 9.0 9.6 9.2  GFRNONAA 34* 35* 38* 34*  GFRAA 40* 40*  --   --   PROT 7.2 7.1 7.1 7.3  ALBUMIN 4.1 3.9 3.7 3.8  AST 19 18 17 20   ALT 12 13 11 13   ALKPHOS 73 75 68 68  BILITOT 0.8 0.6 0.7 0.9     No results found.  ASSESSMENT & PLAN:   Essential thrombocytosis ([facility]) #Essential thrombocytosis-CALR positive; Intermediate risk given her age; on hydrea 500 mg/day; tolerating well.  No concerns for any transformation.  # Platelets today 501; Hemoglobin slightly low at 11.2 white count normal. Continue hydrea 500mg /day.  STABLE.   #Diarrhea-grade 1 Hydrea.  Monitor for now.  Continue antiemetics as needed.  # A.fib- on xarelto; STABLE.   #CKD stage III-[GFR-38s]; on diuretic--STABLE.   # HTN- 128/98- STABLE.   # DISPOSITION:  # follow up in 4 month-MD-labs; cbc/cmp- Dr.B      Cammie Sickle, MD 09/23/2020 3:20 PM

## 2020-09-23 NOTE — Progress Notes (Signed)
Patient here for follow up. Her BP and HR are low advised patient to monitor at home and notify PCP if it remains low.

## 2020-10-02 DIAGNOSIS — M25562 Pain in left knee: Secondary | ICD-10-CM | POA: Diagnosis not present

## 2020-10-17 DIAGNOSIS — Z85828 Personal history of other malignant neoplasm of skin: Secondary | ICD-10-CM | POA: Diagnosis not present

## 2020-10-17 DIAGNOSIS — D485 Neoplasm of uncertain behavior of skin: Secondary | ICD-10-CM | POA: Diagnosis not present

## 2020-10-17 DIAGNOSIS — D2261 Melanocytic nevi of right upper limb, including shoulder: Secondary | ICD-10-CM | POA: Diagnosis not present

## 2020-10-17 DIAGNOSIS — L570 Actinic keratosis: Secondary | ICD-10-CM | POA: Diagnosis not present

## 2020-10-17 DIAGNOSIS — D2262 Melanocytic nevi of left upper limb, including shoulder: Secondary | ICD-10-CM | POA: Diagnosis not present

## 2020-10-17 DIAGNOSIS — D2272 Melanocytic nevi of left lower limb, including hip: Secondary | ICD-10-CM | POA: Diagnosis not present

## 2020-10-17 DIAGNOSIS — D225 Melanocytic nevi of trunk: Secondary | ICD-10-CM | POA: Diagnosis not present

## 2020-11-03 NOTE — Progress Notes (Signed)
Cardiology Office Note  Date:  11/04/2020   ID:  Paula Clark, DOB 11-29-1940, MRN [id number]  PCP:  Derinda Late, MD   Chief Complaint  Patient presents with  . 12 month follow up     "doing well." Medications reviewed by the patient verbally.     HPI:  80 year old woman With history of  hypertension,  persistent atrial fibrillation,   CHADSVASc score of at least 4 syncope in the setting of dehydration,  Stress test 04/30/2016 no ischemia Attempted cardioversion for atrial fibrillation 05/18/2016, failed  severe epistaxis right nostril 04/07/2016 emergency room 04/11/2016 with epistaxis x4,  seen by ENT in the emergency room Admitted to the hospital 04/12/2016 Referred to Dr. Burt Knack for watchman device CRI On chemo for bone marrow cancer/high platelets Who presents for follow-up of her chronic atrial fibrillation  LOV 09/2019 Sedentary at baseline Denies any anginal symptoms Denies any symptoms from her permanent atrial fibrillation  Leg /back problems, comes and goes Rarely uses walker  Reviewed notes from oncology On medication for bone marrow cancer Recent lab work reviewed platelets: 500,  History of thrombocytopenia  Trace edema in the lower extremities  Lab work reviewed with her in detail Total chol 166, LDL 104 HGB 11 CR 1.4 A1C 5.6  EKG personally reviewed by myself on todays visit shows atrial fibrillation with ventricular rate 65 bpm nonspecific ST abnormality  Other past medical history reviewed evaluated by Dr. Burt Knack November 2017 for watchman device  Dr. Burt Knack was going to discuss case with ENT physician prior to scheduling TEE There was some concern that she would not tolerate aspirin and Plavix TEE was never performed    Noted to be in atrial fibrillation in the emergency room, no prior EKGs available but patient responded at that time that she had atrial fibrillation before but was not on anticoagulation  Admitted back to the hospital  04/16/2016 with fall, weakness, dehydration, creatinine 1.6 (improved with IV hydration overnight), discharge on 04/18/2016  She was seen by our office, ryan Dunn 04/21/2016, scheduled for cardioversion Cardioversion attempted 05/18/2016 unsuccessful.  suspect  possibly chronic   PMH:   has a past medical history of Chronic atrial fibrillation ([place]), CKD (chronic kidney disease), stage III ([place]), Epistaxis, Gout, History of stress test, HLD (hyperlipidemia), Hypertension, Mitral regurgitation, and Syncope.  PSH:    Past Surgical History:  Procedure Laterality Date  . ABDOMINAL HYSTERECTOMY    . APPENDECTOMY    . ELECTROPHYSIOLOGIC STUDY N/A 05/18/2016   Procedure: CARDIOVERSION;  Surgeon: Minna Merritts, MD;  Location: [facility] ORS;  Service: Cardiovascular;  Laterality: N/A;  . OOPHORECTOMY    . TONSILLECTOMY    . TOTAL VAGINAL HYSTERECTOMY      Current Outpatient Medications  Medication Sig Dispense Refill  . acetaminophen (TYLENOL) 500 MG tablet Take 500-1,000 mg by mouth every 6 (six) hours as needed for moderate pain or headache.    . cetirizine (ZYRTEC) 10 MG tablet Take 10 mg by mouth daily.    . Cholecalciferol (VITAMIN D3) 1000 units CAPS Take 1,000 Units by mouth daily.    . Cholecalciferol-Vitamin C (VITAMIN D3-VITAMIN C) 1000-500 UNIT-MG CAPS Take 1,000 Units by mouth.    . colchicine 0.6 MG tablet Take 2 tablets (1.2mg ) by mouth at first sign of gout flare followed by 1 tablet (0.6mg ) after 1 hour. (Max 1.8mg  within 1 hour)    . diltiazem (CARDIZEM CD) 240 MG 24 hr capsule TAKE 1 CAPSULE BY MOUTH EVERY DAY 90 capsule 2  .  doxazosin (CARDURA) 8 MG tablet Take 4 mg by mouth in the morning and at bedtime.    . furosemide (LASIX) 20 MG tablet Take 1 tablet (20 mg total) by mouth daily as needed. 30 tablet 3  . hydroxyurea (HYDREA) 500 MG capsule Take 1 capsule (500 mg total) by mouth daily. May take with food to minimize GI side effects. 90 capsule 1  . loperamide (IMODIUM) 2 MG  capsule Take 2 mg by mouth as needed for diarrhea or loose stools.    Alveda Reasons 15 MG TABS tablet TAKE 1 TABLET (15 MG TOTAL) BY MOUTH DAILY WITH SUPPER. 90 tablet 1   No current facility-administered medications for this visit.     Allergies:   Codeine   Social History:  The patient  reports that she has quit smoking. She has never used smokeless tobacco. She reports that she does not drink alcohol and does not use drugs.   Family History:   family history includes Atrial fibrillation in her brother; Heart failure in her mother.    Review of Systems: Review of Systems  Constitutional: Negative.   HENT: Negative.   Respiratory: Negative.   Cardiovascular: Negative.   Gastrointestinal: Negative.   Musculoskeletal: Negative.   Neurological: Negative.   Psychiatric/Behavioral: Negative.   All other systems reviewed and are negative.   PHYSICAL EXAM: VS:  BP 128/60 (BP Location: Left Arm, Patient Position: Sitting, Cuff Size: Normal)   Pulse 65   Ht 5\' 3"  (1.6 m)   Wt 143 lb 6 oz (65 kg)   SpO2 98%   BMI 25.40 kg/m  , BMI Body mass index is 25.4 kg/m. Constitutional:  oriented to person, place, and time. No distress.  HENT:  Head: Grossly normal Eyes:  no discharge. No scleral icterus.  Neck: No JVD, no carotid bruits  Cardiovascular: Regular rate and rhythm, no murmurs appreciated Pulmonary/Chest: Clear to auscultation bilaterally, no wheezes or rails Abdominal: Soft.  no distension.  no tenderness.  Musculoskeletal: Normal range of motion Neurological:  normal muscle tone. Coordination normal. No atrophy Skin: Skin warm and dry Psychiatric: normal affect, pleasant  Recent Labs: 09/23/2020: ALT 13; BUN 23; Creatinine, Ser 1.53; Hemoglobin 11.2; Platelets 501; Potassium 3.8; Sodium 139    Lipid Panel No results found for: CHOL, HDL, LDLCALC, TRIG    Wt Readings from Last 3 Encounters:  11/04/20 143 lb 6 oz (65 kg)  09/23/20 145 lb (65.8 kg)  05/27/20 147 lb  (66.7 kg)       ASSESSMENT AND PLAN:  Permanent atrial fibrillation (HCC) - Plan: EKG 12-Lead  tolerating xarelto 15 mg daily, no bleeding CHADS VASC 4 rate well controlled  continue diltiazem stable  Essential hypertension -  Blood pressure is well controlled on today's visit. No changes made to the medications. Suggested she try cardura 4 BID rather than 8 daily in the morning cardiazem 240 daily, discussed potentially changing to ER 120 twice daily for any orthostasis symptoms  Epistaxis Prior history, none recently Tolerating anticoagulation  Weakness Suggest walking program Discussed various strategies for leg strengthening   Total encounter time more than 25 minutes  Greater than 50% was spent in counseling and coordination of care with the patient   Orders Placed This Encounter  Procedures  . EKG 12-Lead     Signed, Esmond Plants, M.D., Ph.D. 11/04/2020  [place], [place]

## 2020-11-04 ENCOUNTER — Encounter: Payer: Self-pay | Admitting: Cardiovascular Disease

## 2020-11-04 ENCOUNTER — Other Ambulatory Visit: Payer: Self-pay

## 2020-11-04 ENCOUNTER — Ambulatory Visit: Payer: Medicare HMO | Admitting: Cardiovascular Disease

## 2020-11-04 VITALS — BP 128/60 | HR 65 | Ht 63.0 in | Wt 143.4 lb

## 2020-11-04 DIAGNOSIS — I4819 Other persistent atrial fibrillation: Secondary | ICD-10-CM | POA: Diagnosis not present

## 2020-11-04 DIAGNOSIS — I10 Essential (primary) hypertension: Secondary | ICD-10-CM

## 2020-11-04 DIAGNOSIS — I160 Hypertensive urgency: Secondary | ICD-10-CM | POA: Diagnosis not present

## 2020-11-04 DIAGNOSIS — R531 Weakness: Secondary | ICD-10-CM

## 2020-11-04 NOTE — Patient Instructions (Addendum)
Medication Instructions:  Reduce doxazosin in 1/2 (4 mg)  twice a day  (rather than 8 mg in the morning )  If you need a refill on your cardiac medications before your next appointment, please call your pharmacy.    Lab work: No new labs needed  Testing/Procedures: No new testing needed   Follow-Up:  . You will need a follow up appointment in 12 months  . Providers on your designated Care Team:   . Murray Hodgkins, NP . Christell Faith, PA-C . Marrianne Mood, PA-C  COVID-19 Vaccine Information can be found at: ShippingScam.co.uk For questions related to vaccine distribution or appointments, please email vaccine@[email or web address] .com or call [phone].

## 2020-11-18 DIAGNOSIS — C44722 Squamous cell carcinoma of skin of right lower limb, including hip: Secondary | ICD-10-CM | POA: Diagnosis not present

## 2020-12-02 DIAGNOSIS — C44319 Basal cell carcinoma of skin of other parts of face: Secondary | ICD-10-CM | POA: Diagnosis not present

## 2020-12-02 DIAGNOSIS — C44311 Basal cell carcinoma of skin of nose: Secondary | ICD-10-CM | POA: Diagnosis not present

## 2020-12-09 DIAGNOSIS — D0439 Carcinoma in situ of skin of other parts of face: Secondary | ICD-10-CM | POA: Diagnosis not present

## 2020-12-14 ENCOUNTER — Other Ambulatory Visit: Payer: Self-pay | Admitting: Internal Medicine

## 2020-12-14 ENCOUNTER — Other Ambulatory Visit: Payer: Self-pay | Admitting: Cardiovascular Disease

## 2021-01-13 DIAGNOSIS — Z79899 Other long term (current) drug therapy: Secondary | ICD-10-CM | POA: Diagnosis not present

## 2021-01-13 DIAGNOSIS — N1832 Chronic kidney disease, stage 3b: Secondary | ICD-10-CM | POA: Diagnosis not present

## 2021-01-13 DIAGNOSIS — I10 Essential (primary) hypertension: Secondary | ICD-10-CM | POA: Diagnosis not present

## 2021-01-13 DIAGNOSIS — R739 Hyperglycemia, unspecified: Secondary | ICD-10-CM | POA: Diagnosis not present

## 2021-01-20 DIAGNOSIS — Z0000 Encounter for general adult medical examination without abnormal findings: Secondary | ICD-10-CM | POA: Diagnosis not present

## 2021-01-20 DIAGNOSIS — Z1331 Encounter for screening for depression: Secondary | ICD-10-CM | POA: Diagnosis not present

## 2021-01-20 DIAGNOSIS — D75839 Thrombocytosis, unspecified: Secondary | ICD-10-CM | POA: Diagnosis not present

## 2021-01-20 DIAGNOSIS — Z79899 Other long term (current) drug therapy: Secondary | ICD-10-CM | POA: Diagnosis not present

## 2021-01-20 DIAGNOSIS — N189 Chronic kidney disease, unspecified: Secondary | ICD-10-CM | POA: Diagnosis not present

## 2021-01-20 DIAGNOSIS — I480 Paroxysmal atrial fibrillation: Secondary | ICD-10-CM | POA: Diagnosis not present

## 2021-01-22 ENCOUNTER — Other Ambulatory Visit: Payer: Self-pay | Admitting: *Deleted

## 2021-01-22 DIAGNOSIS — D473 Essential (hemorrhagic) thrombocythemia: Secondary | ICD-10-CM

## 2021-01-23 ENCOUNTER — Inpatient Hospital Stay: Payer: Medicare HMO | Attending: Internal Medicine

## 2021-01-23 ENCOUNTER — Inpatient Hospital Stay (HOSPITAL_BASED_OUTPATIENT_CLINIC_OR_DEPARTMENT_OTHER): Payer: Medicare HMO | Admitting: Internal Medicine

## 2021-01-23 ENCOUNTER — Other Ambulatory Visit: Payer: Self-pay

## 2021-01-23 ENCOUNTER — Encounter: Payer: Self-pay | Admitting: Internal Medicine

## 2021-01-23 DIAGNOSIS — N183 Chronic kidney disease, stage 3 unspecified: Secondary | ICD-10-CM | POA: Insufficient documentation

## 2021-01-23 DIAGNOSIS — R030 Elevated blood-pressure reading, without diagnosis of hypertension: Secondary | ICD-10-CM | POA: Insufficient documentation

## 2021-01-23 DIAGNOSIS — I4891 Unspecified atrial fibrillation: Secondary | ICD-10-CM | POA: Insufficient documentation

## 2021-01-23 DIAGNOSIS — R197 Diarrhea, unspecified: Secondary | ICD-10-CM | POA: Insufficient documentation

## 2021-01-23 DIAGNOSIS — D473 Essential (hemorrhagic) thrombocythemia: Secondary | ICD-10-CM

## 2021-01-23 DIAGNOSIS — Z7901 Long term (current) use of anticoagulants: Secondary | ICD-10-CM | POA: Insufficient documentation

## 2021-01-23 LAB — CBC WITH DIFFERENTIAL/PLATELET
Abs Immature Granulocytes: 0.04 10*3/uL (ref 0.00–0.07)
Basophils Absolute: 0.1 10*3/uL (ref 0.0–0.1)
Basophils Relative: 1 %
Eosinophils Absolute: 0.1 10*3/uL (ref 0.0–0.5)
Eosinophils Relative: 2 %
HCT: 33.9 % — ABNORMAL LOW (ref 36.0–46.0)
Hemoglobin: 11.3 g/dL — ABNORMAL LOW (ref 12.0–15.0)
Immature Granulocytes: 1 %
Lymphocytes Relative: 24 %
Lymphs Abs: 1.2 10*3/uL (ref 0.7–4.0)
MCH: 37.3 pg — ABNORMAL HIGH (ref 26.0–34.0)
MCHC: 33.3 g/dL (ref 30.0–36.0)
MCV: 111.9 fL — ABNORMAL HIGH (ref 80.0–100.0)
Monocytes Absolute: 0.8 10*3/uL (ref 0.1–1.0)
Monocytes Relative: 15 %
Neutro Abs: 2.8 10*3/uL (ref 1.7–7.7)
Neutrophils Relative %: 57 %
Platelets: 547 10*3/uL — ABNORMAL HIGH (ref 150–400)
RBC: 3.03 MIL/uL — ABNORMAL LOW (ref 3.87–5.11)
RDW: 13.5 % (ref 11.5–15.5)
WBC: 5 10*3/uL (ref 4.0–10.5)
nRBC: 0 % (ref 0.0–0.2)

## 2021-01-23 LAB — COMPREHENSIVE METABOLIC PANEL
ALT: 12 U/L (ref 0–44)
AST: 19 U/L (ref 15–41)
Albumin: 3.9 g/dL (ref 3.5–5.0)
Alkaline Phosphatase: 64 U/L (ref 38–126)
Anion gap: 8 (ref 5–15)
BUN: 24 mg/dL — ABNORMAL HIGH (ref 8–23)
CO2: 25 mmol/L (ref 22–32)
Calcium: 9.1 mg/dL (ref 8.9–10.3)
Chloride: 103 mmol/L (ref 98–111)
Creatinine, Ser: 1.3 mg/dL — ABNORMAL HIGH (ref 0.44–1.00)
GFR, Estimated: 41 mL/min — ABNORMAL LOW (ref >60)
Glucose, Bld: 110 mg/dL — ABNORMAL HIGH (ref 70–99)
Potassium: 4.2 mmol/L (ref 3.5–5.1)
Sodium: 136 mmol/L (ref 135–145)
Total Bilirubin: 0.6 mg/dL (ref 0.3–1.2)
Total Protein: 7.2 g/dL (ref 6.5–8.1)

## 2021-01-23 NOTE — Progress Notes (Signed)
Patient here for oncology follow-up appointment, expresses concerns of back pain

## 2021-01-23 NOTE — Progress Notes (Signed)
[facility] CONSULT NOTE  Patient Care Team: Derinda Late, MD as PCP - General (Family Medicine) Rockey Situ Kathlene November, MD as PCP - Cardiology (Cardiology) Minna Merritts, MD as Consulting Physician (Cardiology) Cammie Sickle, MD as Consulting Physician (Internal Medicine)  CHIEF COMPLAINTS/PURPOSE OF CONSULTATION: Thrombocytosis  Oncology History Overview Note  # ESSENTIAL THROMBOCYTOSIS [[June 2019- 684; July 2020- platelets-992 ; Hb-12 white count- 9];INTERMEDIATE RISK;July 2020-; CALR MUTATION POSITIVE; July 2020- Hydrea 500 mg/day.   # A.fib on xarelto [A.fib]  # Chronic Kidney disease-III   Essential thrombocytosis ([place])  01/30/2019 Initial Diagnosis   Essential thrombocytosis ([place])       HISTORY OF PRESENTING ILLNESS:  Paula Clark 80 y.o.  female pleasant patient with essential thrombocytosis currently on Hydrea is here for follow-up.  Patient denies any recent stroke or blood clots mentioned.  Denies any headaches.  No weight loss.  No nausea no vomiting.  Mild diarrhea 1-2 loose stools a day.  No skin rash.  No blood in stools or black-colored stools.  Review of Systems  Constitutional:  Positive for malaise/fatigue. Negative for chills, diaphoresis, fever and weight loss.  HENT:  Negative for nosebleeds and sore throat.   Eyes:  Negative for double vision.  Respiratory:  Negative for cough, hemoptysis, sputum production, shortness of breath and wheezing.   Cardiovascular:  Negative for chest pain, palpitations, orthopnea and leg swelling.  Gastrointestinal:  Positive for diarrhea. Negative for abdominal pain, blood in stool, constipation, heartburn, melena, nausea and vomiting.  Genitourinary:  Negative for dysuria, frequency and urgency.  Musculoskeletal:  Positive for joint pain. Negative for back pain.  Skin: Negative.  Negative for itching and rash.  Neurological:  Negative for dizziness, tingling, focal weakness, weakness and headaches.   Endo/Heme/Allergies:  Does not bruise/bleed easily.  Psychiatric/Behavioral:  Negative for depression. The patient is not nervous/anxious and does not have insomnia.     MEDICAL HISTORY:  Past Medical History:  Diagnosis Date   Chronic atrial fibrillation ([place])    a. Unsuccessful DCCV 05/2016; b. CHADS2VASc => 4 (HTN, age x 2, female)--> Xarelto.   CKD (chronic kidney disease), stage III (HCC)    Epistaxis    Gout    History of stress test    a. 04/2016 MV: EF 80%, no ischemia.   HLD (hyperlipidemia)    Hypertension    Mitral regurgitation    a. Echo 04/2016: EF 60-65%, no RWMA, mild AI, mild MR, mild biatrial enlargement, PASP 32 mmHg, trivial pericardial effusion noted posterior to the heart   Syncope     SURGICAL HISTORY: Past Surgical History:  Procedure Laterality Date   ABDOMINAL HYSTERECTOMY     APPENDECTOMY     ELECTROPHYSIOLOGIC STUDY N/A 05/18/2016   Procedure: CARDIOVERSION;  Surgeon: Minna Merritts, MD;  Location: [facility] ORS;  Service: Cardiovascular;  Laterality: N/A;   OOPHORECTOMY     TONSILLECTOMY     TOTAL VAGINAL HYSTERECTOMY      SOCIAL HISTORY: Social History   Socioeconomic History   Marital status: Widowed    Spouse name: Not on file   Number of children: Not on file   Years of education: Not on file   Highest education level: Not on file  Occupational History   Not on file  Tobacco Use   Smoking status: Former   Smokeless tobacco: Never  Vaping Use   Vaping Use: Never used  Substance and Sexual Activity   Alcohol use: No   Drug use: No  Sexual activity: Not on file  Other Topics Concern   Not on file  Social History Narrative   [name]; self; remote smoking; No alcohol; worked in [employer] retd.    Social Determinants of Health   Financial Resource Strain: Not on file  Food Insecurity: Not on file  Transportation Needs: Not on file  Physical Activity: Not on file  Stress: Not on file  Social Connections: Not on file   Intimate Partner Violence: Not on file    FAMILY HISTORY: Family History  Problem Relation Age of Onset   Heart failure Mother    Atrial fibrillation Brother     ALLERGIES:  is allergic to codeine.  MEDICATIONS:  Current Outpatient Medications  Medication Sig Dispense Refill   acetaminophen (TYLENOL) 500 MG tablet Take 500-1,000 mg by mouth every 6 (six) hours as needed for moderate pain or headache.     cetirizine (ZYRTEC) 10 MG tablet Take 10 mg by mouth daily.     Cholecalciferol (VITAMIN D3) 1000 units CAPS Take 1,000 Units by mouth daily.     Cholecalciferol-Vitamin C (VITAMIN D3-VITAMIN C) 1000-500 UNIT-MG CAPS Take 1,000 Units by mouth.     diltiazem (CARDIZEM CD) 240 MG 24 hr capsule TAKE 1 CAPSULE BY MOUTH EVERY DAY 90 capsule 2   doxazosin (CARDURA) 8 MG tablet Take 4 mg by mouth in the morning and at bedtime.     furosemide (LASIX) 20 MG tablet Take 1 tablet (20 mg total) by mouth daily as needed. 30 tablet 3   hydroxyurea (HYDREA) 500 MG capsule TAKE 1 CAPSULE (500 MG TOTAL) BY MOUTH DAILY. MAY TAKE WITH FOOD TO MINIMIZE GI SIDE EFFECTS. 90 capsule 1   loperamide (IMODIUM) 2 MG capsule Take 2 mg by mouth as needed for diarrhea or loose stools.     XARELTO 15 MG TABS tablet TAKE 1 TABLET (15 MG TOTAL) BY MOUTH DAILY WITH SUPPER. 90 tablet 1   colchicine 0.6 MG tablet Take 2 tablets (1.2mg ) by mouth at first sign of gout flare followed by 1 tablet (0.6mg ) after 1 hour. (Max 1.8mg  within 1 hour) (Patient not taking: Reported on 01/23/2021)     No current facility-administered medications for this visit.     PHYSICAL EXAMINATION:   Vitals:   01/23/21 1344  BP: (!) 193/63  Pulse: 74  Resp: 16  Temp: (!) 97.4 F (36.3 C)  SpO2: 99%   Filed Weights   01/23/21 1344  Weight: 143 lb (64.9 kg)    Physical Exam HENT:     Head: Normocephalic and atraumatic.     Mouth/Throat:     Pharynx: No oropharyngeal exudate.  Eyes:     Pupils: Pupils are equal, round, and  reactive to light.  Cardiovascular:     Rate and Rhythm: Normal rate and regular rhythm.  Pulmonary:     Effort: Pulmonary effort is normal. No respiratory distress.     Breath sounds: Normal breath sounds. No wheezing.  Abdominal:     General: Bowel sounds are normal. There is no distension.     Palpations: Abdomen is soft. There is no mass.     Tenderness: no abdominal tenderness There is no guarding or rebound.  Musculoskeletal:        General: No tenderness. Normal range of motion.     Cervical back: Normal range of motion and neck supple.  Skin:    General: Skin is warm.  Neurological:     Mental Status: She is alert and  oriented to person, place, and time.  Psychiatric:        Mood and Affect: Affect normal.     LABORATORY DATA:  I have reviewed the data as listed Lab Results  Component Value Date   WBC 5.0 01/23/2021   HGB 11.3 (L) 01/23/2021   HCT 33.9 (L) 01/23/2021   MCV 111.9 (H) 01/23/2021   PLT 547 (H) 01/23/2021   Recent Labs    05/27/20 1424 09/23/20 1423 01/23/21 1327  NA 138 139 136  K 3.5 3.8 4.2  CL 103 104 103  CO2 27 22 25   GLUCOSE 137* 167* 110*  BUN 20 23 24*  CREATININE 1.40* 1.53* 1.30*  CALCIUM 9.6 9.2 9.1  GFRNONAA 38* 34* 41*  PROT 7.1 7.3 7.2  ALBUMIN 3.7 3.8 3.9  AST 17 20 19   ALT 11 13 12   ALKPHOS 68 68 64  BILITOT 0.7 0.9 0.6     No results found.  ASSESSMENT & PLAN:   Essential thrombocytosis (HCC) #Essential thrombocytosis-CALR positive; Intermediate risk given her age; on hydrea 500 mg/day; tolerating well.  No concerns for any transformation.  # Platelets today 547; Hemoglobin slightly low at 11.2 white count normal. Continue hydrea 500mg /day.  STABLE.   # Elevated BP 190 [at PCP- 120s]; 130s- repeat; monitor for now.   #Diarrhea-grade 1 Hydrea.  Monitor for now.  Continue anti-diarrheals as needed.  # A.fib- on xarelto; -STABLE.  #CKD stage III-[GFR-38s]; on diuretic- -STABLE.  # DISPOSITION:  # follow up in  6 month-MD-labs; cbc/cmp/LDH- Dr.B    Cammie Sickle, MD 02/01/2021 11:04 PM

## 2021-01-23 NOTE — Assessment & Plan Note (Addendum)
#  Essential thrombocytosis-CALR positive; Intermediate risk given her age; on hydrea 500 mg/day; tolerating well.  No concerns for any transformation.  # Platelets today 547; Hemoglobin slightly low at 11.2 white count normal. Continue hydrea 500mg /day.  STABLE.   # Elevated BP 190 [at PCP- 120s]; 130s- repeat; monitor for now.   #Diarrhea-grade 1 Hydrea.  Monitor for now.  Continue anti-diarrheals as needed.  # A.fib- on xarelto; -STABLE.  #CKD stage III-[GFR-38s]; on diuretic- -STABLE.  # DISPOSITION:  # follow up in 6 month-MD-labs; cbc/cmp/LDH- Dr.B

## 2021-02-22 ENCOUNTER — Other Ambulatory Visit: Payer: Self-pay | Admitting: Cardiovascular Disease

## 2021-02-22 DIAGNOSIS — I4819 Other persistent atrial fibrillation: Secondary | ICD-10-CM

## 2021-02-23 NOTE — Telephone Encounter (Signed)
Prescription refill request for Xarelto received.  Indication:afib Last office visit: gollan 11/04/20 Weight:64.9kg Age:50fScr:1.3 7/15 22 CrCl:34.2

## 2021-02-23 NOTE — Telephone Encounter (Signed)
Prescription refill request for Xarelto received.  Indication: Atrial fib Last office visit: 11/04/20  Johnny Bridge MD Weight: 65kg Age: 80 Scr: 1.30 on 01/23/21 CrCl: 34.24  Based on above findings Xarelto '15mg'$  daily (renal) is the appropriate dose.  Refill approved.

## 2021-02-23 NOTE — Telephone Encounter (Signed)
Refill Request.  

## 2021-03-20 DIAGNOSIS — L570 Actinic keratosis: Secondary | ICD-10-CM | POA: Diagnosis not present

## 2021-03-20 DIAGNOSIS — D2262 Melanocytic nevi of left upper limb, including shoulder: Secondary | ICD-10-CM | POA: Diagnosis not present

## 2021-03-20 DIAGNOSIS — D485 Neoplasm of uncertain behavior of skin: Secondary | ICD-10-CM | POA: Diagnosis not present

## 2021-03-20 DIAGNOSIS — D2261 Melanocytic nevi of right upper limb, including shoulder: Secondary | ICD-10-CM | POA: Diagnosis not present

## 2021-03-20 DIAGNOSIS — D0462 Carcinoma in situ of skin of left upper limb, including shoulder: Secondary | ICD-10-CM | POA: Diagnosis not present

## 2021-03-20 DIAGNOSIS — Z85828 Personal history of other malignant neoplasm of skin: Secondary | ICD-10-CM | POA: Diagnosis not present

## 2021-03-20 DIAGNOSIS — D2272 Melanocytic nevi of left lower limb, including hip: Secondary | ICD-10-CM | POA: Diagnosis not present

## 2021-03-20 DIAGNOSIS — L821 Other seborrheic keratosis: Secondary | ICD-10-CM | POA: Diagnosis not present

## 2021-04-06 DIAGNOSIS — D0462 Carcinoma in situ of skin of left upper limb, including shoulder: Secondary | ICD-10-CM | POA: Diagnosis not present

## 2021-06-13 ENCOUNTER — Other Ambulatory Visit: Payer: Self-pay | Admitting: Internal Medicine

## 2021-07-16 ENCOUNTER — Other Ambulatory Visit: Payer: Self-pay | Admitting: *Deleted

## 2021-07-16 DIAGNOSIS — D473 Essential (hemorrhagic) thrombocythemia: Secondary | ICD-10-CM

## 2021-07-21 DIAGNOSIS — I10 Essential (primary) hypertension: Secondary | ICD-10-CM | POA: Diagnosis not present

## 2021-07-21 DIAGNOSIS — R739 Hyperglycemia, unspecified: Secondary | ICD-10-CM | POA: Diagnosis not present

## 2021-07-21 DIAGNOSIS — Z79899 Other long term (current) drug therapy: Secondary | ICD-10-CM | POA: Diagnosis not present

## 2021-07-24 ENCOUNTER — Inpatient Hospital Stay: Payer: Medicare HMO | Attending: Internal Medicine

## 2021-07-24 ENCOUNTER — Inpatient Hospital Stay (HOSPITAL_BASED_OUTPATIENT_CLINIC_OR_DEPARTMENT_OTHER): Payer: Medicare HMO | Admitting: Internal Medicine

## 2021-07-24 ENCOUNTER — Other Ambulatory Visit: Payer: Self-pay

## 2021-07-24 ENCOUNTER — Encounter: Payer: Self-pay | Admitting: Internal Medicine

## 2021-07-24 DIAGNOSIS — R197 Diarrhea, unspecified: Secondary | ICD-10-CM | POA: Diagnosis not present

## 2021-07-24 DIAGNOSIS — D631 Anemia in chronic kidney disease: Secondary | ICD-10-CM | POA: Insufficient documentation

## 2021-07-24 DIAGNOSIS — I129 Hypertensive chronic kidney disease with stage 1 through stage 4 chronic kidney disease, or unspecified chronic kidney disease: Secondary | ICD-10-CM | POA: Diagnosis not present

## 2021-07-24 DIAGNOSIS — Z7901 Long term (current) use of anticoagulants: Secondary | ICD-10-CM | POA: Insufficient documentation

## 2021-07-24 DIAGNOSIS — I482 Chronic atrial fibrillation, unspecified: Secondary | ICD-10-CM | POA: Diagnosis not present

## 2021-07-24 DIAGNOSIS — Z90710 Acquired absence of both cervix and uterus: Secondary | ICD-10-CM | POA: Insufficient documentation

## 2021-07-24 DIAGNOSIS — Z87891 Personal history of nicotine dependence: Secondary | ICD-10-CM | POA: Insufficient documentation

## 2021-07-24 DIAGNOSIS — D75839 Thrombocytosis, unspecified: Secondary | ICD-10-CM | POA: Diagnosis not present

## 2021-07-24 DIAGNOSIS — D473 Essential (hemorrhagic) thrombocythemia: Secondary | ICD-10-CM | POA: Diagnosis not present

## 2021-07-24 DIAGNOSIS — N183 Chronic kidney disease, stage 3 unspecified: Secondary | ICD-10-CM | POA: Diagnosis not present

## 2021-07-24 LAB — COMPREHENSIVE METABOLIC PANEL
ALT: 12 U/L (ref 0–44)
AST: 19 U/L (ref 15–41)
Albumin: 4 g/dL (ref 3.5–5.0)
Alkaline Phosphatase: 67 U/L (ref 38–126)
Anion gap: 5 (ref 5–15)
BUN: 23 mg/dL (ref 8–23)
CO2: 25 mmol/L (ref 22–32)
Calcium: 8.9 mg/dL (ref 8.9–10.3)
Chloride: 105 mmol/L (ref 98–111)
Creatinine, Ser: 1.13 mg/dL — ABNORMAL HIGH (ref 0.44–1.00)
GFR, Estimated: 49 mL/min — ABNORMAL LOW (ref >60)
Glucose, Bld: 99 mg/dL (ref 70–99)
Potassium: 3.8 mmol/L (ref 3.5–5.1)
Sodium: 135 mmol/L (ref 135–145)
Total Bilirubin: 0.7 mg/dL (ref 0.3–1.2)
Total Protein: 7.1 g/dL (ref 6.5–8.1)

## 2021-07-24 LAB — CBC WITH DIFFERENTIAL/PLATELET
Abs Immature Granulocytes: 0.04 10*3/uL (ref 0.00–0.07)
Basophils Absolute: 0 10*3/uL (ref 0.0–0.1)
Basophils Relative: 1 %
Eosinophils Absolute: 0.1 10*3/uL (ref 0.0–0.5)
Eosinophils Relative: 2 %
HCT: 32.4 % — ABNORMAL LOW (ref 36.0–46.0)
Hemoglobin: 10.8 g/dL — ABNORMAL LOW (ref 12.0–15.0)
Immature Granulocytes: 1 %
Lymphocytes Relative: 27 %
Lymphs Abs: 1.5 10*3/uL (ref 0.7–4.0)
MCH: 37.5 pg — ABNORMAL HIGH (ref 26.0–34.0)
MCHC: 33.3 g/dL (ref 30.0–36.0)
MCV: 112.5 fL — ABNORMAL HIGH (ref 80.0–100.0)
Monocytes Absolute: 0.8 10*3/uL (ref 0.1–1.0)
Monocytes Relative: 14 %
Neutro Abs: 3.1 10*3/uL (ref 1.7–7.7)
Neutrophils Relative %: 55 %
Platelets: 550 10*3/uL — ABNORMAL HIGH (ref 150–400)
RBC: 2.88 MIL/uL — ABNORMAL LOW (ref 3.87–5.11)
RDW: 13.3 % (ref 11.5–15.5)
WBC: 5.6 10*3/uL (ref 4.0–10.5)
nRBC: 0 % (ref 0.0–0.2)

## 2021-07-24 LAB — IRON AND TIBC
Iron: 76 ug/dL (ref 28–170)
Saturation Ratios: 20 % (ref 10.4–31.8)
TIBC: 372 ug/dL (ref 250–450)
UIBC: 296 ug/dL

## 2021-07-24 LAB — LACTATE DEHYDROGENASE: LDH: 232 U/L — ABNORMAL HIGH (ref 98–192)

## 2021-07-24 LAB — FERRITIN: Ferritin: 45 ng/mL (ref 11–307)

## 2021-07-24 NOTE — Progress Notes (Signed)
Patient denies new problems/concerns today.   °

## 2021-07-24 NOTE — Patient Instructions (Signed)
#  Recommend taking 1 a day-it is over-the-counter gentle iron [iron biglycinate] over-the-counter tablet

## 2021-07-24 NOTE — Progress Notes (Signed)
[place] NOTE  Patient Care Team: Derinda Late, MD as PCP - General (Family Medicine) Rockey Situ Kathlene November, MD as PCP - Cardiology (Cardiology) Minna Merritts, MD as Consulting Physician (Cardiology) Cammie Sickle, MD as Consulting Physician (Internal Medicine)  CHIEF COMPLAINTS/PURPOSE OF CONSULTATION: ET  Oncology History Overview Note  # ESSENTIAL THROMBOCYTOSIS [name] 2019- 684; July 2020- platelets-992 ; Hb-12 white count- 9];INTERMEDIATE RISK;July 2020-; CALR MUTATION POSITIVE; July 2020- Hydrea 500 mg/day.   # A.fib on xarelto [A.fib]  # Chronic Kidney disease-III   Essential thrombocytosis ([place])  01/30/2019 Initial Diagnosis   Essential thrombocytosis ([place])       HISTORY OF PRESENTING ILLNESS:  Paula Clark 81 y.o.  female pleasant patient with essential thrombocytosis currently on Hydrea is here for follow-up.  Patient denies any blood in stools or black or stools.  Mild diarrhea 1-2 loose stools a day.  Denies any skin ulcerations.  No rash.  No history of strokes or blood clots.  Review of Systems  Constitutional:  Positive for malaise/fatigue. Negative for chills, diaphoresis, fever and weight loss.  HENT:  Negative for nosebleeds and sore throat.   Eyes:  Negative for double vision.  Respiratory:  Negative for cough, hemoptysis, sputum production, shortness of breath and wheezing.   Cardiovascular:  Negative for chest pain, palpitations, orthopnea and leg swelling.  Gastrointestinal:  Positive for diarrhea. Negative for abdominal pain, blood in stool, constipation, heartburn, melena, nausea and vomiting.  Genitourinary:  Negative for dysuria, frequency and urgency.  Musculoskeletal:  Positive for joint pain. Negative for back pain.  Skin: Negative.  Negative for itching and rash.  Neurological:  Negative for dizziness, tingling, focal weakness, weakness and headaches.  Endo/Heme/Allergies:  Does not bruise/bleed easily.   Psychiatric/Behavioral:  Negative for depression. The patient is not nervous/anxious and does not have insomnia.     MEDICAL HISTORY:  Past Medical History:  Diagnosis Date   Chronic atrial fibrillation ([place])    a. Unsuccessful DCCV 05/2016; b. CHADS2VASc => 4 (HTN, age x 2, female)--> Xarelto.   CKD (chronic kidney disease), stage III (HCC)    Epistaxis    Gout    History of stress test    a. 04/2016 MV: EF 80%, no ischemia.   HLD (hyperlipidemia)    Hypertension    Mitral regurgitation    a. Echo 04/2016: EF 60-65%, no RWMA, mild AI, mild MR, mild biatrial enlargement, PASP 32 mmHg, trivial pericardial effusion noted posterior to the heart   Syncope     SURGICAL HISTORY: Past Surgical History:  Procedure Laterality Date   ABDOMINAL HYSTERECTOMY     APPENDECTOMY     ELECTROPHYSIOLOGIC STUDY N/A 05/18/2016   Procedure: CARDIOVERSION;  Surgeon: Minna Merritts, MD;  Location: [facility] ORS;  Service: Cardiovascular;  Laterality: N/A;   OOPHORECTOMY     TONSILLECTOMY     TOTAL VAGINAL HYSTERECTOMY      SOCIAL HISTORY: Social History   Socioeconomic History   Marital status: Widowed    Spouse name: Not on file   Number of children: Not on file   Years of education: Not on file   Highest education level: Not on file  Occupational History   Not on file  Tobacco Use   Smoking status: Former   Smokeless tobacco: Never  Vaping Use   Vaping Use: Never used  Substance and Sexual Activity   Alcohol use: No   Drug use: No   Sexual activity: Not on file  Other Topics Concern   Not on file  Social History Narrative   [name]; self; remote smoking; No alcohol; worked in [employer] retd.    Social Determinants of Health   Financial Resource Strain: Not on file  Food Insecurity: Not on file  Transportation Needs: Not on file  Physical Activity: Not on file  Stress: Not on file  Social Connections: Not on file  Intimate Partner Violence: Not on file    FAMILY  HISTORY: Family History  Problem Relation Age of Onset   Heart failure Mother    Atrial fibrillation Brother     ALLERGIES:  is allergic to codeine.  MEDICATIONS:  Current Outpatient Medications  Medication Sig Dispense Refill   acetaminophen (TYLENOL) 500 MG tablet Take 500-1,000 mg by mouth every 6 (six) hours as needed for moderate pain or headache.     cetirizine (ZYRTEC) 10 MG tablet Take 10 mg by mouth daily.     Cholecalciferol (VITAMIN D3) 1000 units CAPS Take 1,000 Units by mouth daily.     diltiazem (CARDIZEM CD) 240 MG 24 hr capsule TAKE 1 CAPSULE BY MOUTH EVERY DAY 90 capsule 2   doxazosin (CARDURA) 8 MG tablet Take 4 mg by mouth in the morning and at bedtime.     furosemide (LASIX) 20 MG tablet Take 1 tablet (20 mg total) by mouth daily as needed. 30 tablet 3   hydroxyurea (HYDREA) 500 MG capsule TAKE 1 CAPSULE (500 MG TOTAL) BY MOUTH DAILY. MAY TAKE WITH FOOD TO MINIMIZE STOMACH SIDE EFFECTS. 90 capsule 1   loperamide (IMODIUM) 2 MG capsule Take 2 mg by mouth as needed for diarrhea or loose stools.     XARELTO 15 MG TABS tablet TAKE 1 TABLET (15 MG TOTAL) BY MOUTH DAILY WITH SUPPER. 90 tablet 1   Cholecalciferol-Vitamin C (VITAMIN D3-VITAMIN C) 1000-500 UNIT-MG CAPS Take 1,000 Units by mouth. (Patient not taking: Reported on 07/24/2021)     colchicine 0.6 MG tablet Take 2 tablets (1.2mg ) by mouth at first sign of gout flare followed by 1 tablet (0.6mg ) after 1 hour. (Max 1.8mg  within 1 hour) (Patient not taking: Reported on 01/23/2021)     No current facility-administered medications for this visit.     PHYSICAL EXAMINATION:   Vitals:   07/24/21 1352  BP: (!) 183/72  Pulse: 64  Resp: 16  Temp: (!) 96.7 F (35.9 C)   Filed Weights   07/24/21 1352  Weight: 146 lb 4.8 oz (66.4 kg)    Physical Exam HENT:     Head: Normocephalic and atraumatic.     Mouth/Throat:     Pharynx: No oropharyngeal exudate.  Eyes:     Pupils: Pupils are equal, round, and reactive to  light.  Cardiovascular:     Rate and Rhythm: Normal rate and regular rhythm.  Pulmonary:     Effort: Pulmonary effort is normal. No respiratory distress.     Breath sounds: Normal breath sounds. No wheezing.  Abdominal:     General: Bowel sounds are normal. There is no distension.     Palpations: Abdomen is soft. There is no mass.     Tenderness: There is no abdominal tenderness. There is no guarding or rebound.  Musculoskeletal:        General: No tenderness. Normal range of motion.     Cervical back: Normal range of motion and neck supple.  Skin:    General: Skin is warm.  Neurological:     Mental Status: She is alert  and oriented to person, place, and time.  Psychiatric:        Mood and Affect: Affect normal.     LABORATORY DATA:  I have reviewed the data as listed Lab Results  Component Value Date   WBC 5.6 07/24/2021   HGB 10.8 (L) 07/24/2021   HCT 32.4 (L) 07/24/2021   MCV 112.5 (H) 07/24/2021   PLT 550 (H) 07/24/2021   Recent Labs    09/23/20 1423 01/23/21 1327 07/24/21 1337  NA 139 136 135  K 3.8 4.2 3.8  CL 104 103 105  CO2 22 25 25   GLUCOSE 167* 110* 99  BUN 23 24* 23  CREATININE 1.53* 1.30* 1.13*  CALCIUM 9.2 9.1 8.9  GFRNONAA 34* 41* 49*  PROT 7.3 7.2 7.1  ALBUMIN 3.8 3.9 4.0  AST 20 19 19   ALT 13 12 12   ALKPHOS 68 64 67  BILITOT 0.9 0.6 0.7     No results found.  ASSESSMENT & PLAN:   Essential thrombocytosis (HCC) #Essential thrombocytosis-CALR positive; Intermediate risk given her age; on hydrea 500 mg/day; tolerating well.   I am not concerned about progressive myelofibrosis/acute leukemia.  # Platelets today 550; Hemoglobin slightly low at 10.8 white count normal. Continue hydrea 500mg /day.  STABLE.   #Anemia-hemoglobin ~10-11- ?  Secondary CKD/Hydrea.  We will check iron studies.  Recommend oral iron/gentle.   # #CKD stage III-[GFR-38s]; on diuretic- -STABLE.  # Elevated BP 190 [at PCP- 120s]; 130s- repeat; monitor for now.    #Diarrhea-grade 1 Hydrea.  Monitor for now.  Continue anti-diarrheals as needed.  # A.fib- on xarelto; -STABLE.  # Bil LE- recommend compression stockings.   # DISPOSITION:  # add iron studies/ferritin to todays labs # follow up in 4 month-MD-labs; cbc/cmp/LDH- Dr.B    Cammie Sickle, MD 07/24/2021 3:31 PM

## 2021-07-24 NOTE — Assessment & Plan Note (Addendum)
#  Essential thrombocytosis-CALR positive; Intermediate risk given her age; on hydrea 500 mg/day; tolerating well.   I am not concerned about progressive myelofibrosis/acute leukemia.  # Platelets today 550; Hemoglobin slightly low at 10.8 white count normal. Continue hydrea 500mg /day.  STABLE.   #Anemia-hemoglobin ~10-11- ?  Secondary CKD/Hydrea.  We will check iron studies.  Recommend oral iron/gentle.   # #CKD stage III-[GFR-38s]; on diuretic- -STABLE.  # Elevated BP 190 [at PCP- 120s]; 130s- repeat; monitor for now.   #Diarrhea-grade 1 Hydrea.  Monitor for now.  Continue anti-diarrheals as needed.  # A.fib- on xarelto; -STABLE.  # Bil LE- recommend compression stockings.   # DISPOSITION:  # add iron studies/ferritin to todays labs # follow up in 4 month-MD-labs; cbc/cmp/LDH- Dr.B

## 2021-08-24 DIAGNOSIS — N189 Chronic kidney disease, unspecified: Secondary | ICD-10-CM | POA: Diagnosis not present

## 2021-08-24 DIAGNOSIS — D75839 Thrombocytosis, unspecified: Secondary | ICD-10-CM | POA: Diagnosis not present

## 2021-08-24 DIAGNOSIS — I129 Hypertensive chronic kidney disease with stage 1 through stage 4 chronic kidney disease, or unspecified chronic kidney disease: Secondary | ICD-10-CM | POA: Diagnosis not present

## 2021-08-24 DIAGNOSIS — I4891 Unspecified atrial fibrillation: Secondary | ICD-10-CM | POA: Diagnosis not present

## 2021-08-30 ENCOUNTER — Other Ambulatory Visit: Payer: Self-pay | Admitting: Cardiovascular Disease

## 2021-08-30 DIAGNOSIS — I4819 Other persistent atrial fibrillation: Secondary | ICD-10-CM

## 2021-08-31 NOTE — Telephone Encounter (Signed)
Pt last saw Dr Rockey Situ 11/04/20, last labs 07/24/21 Creat 1.13, age 81, weight 66.4kg, CrCl 40.24, based on CrCl pt is on appropriate dosage of Xarelto 15mg  QD for afib.  Will refill rx.

## 2021-08-31 NOTE — Telephone Encounter (Signed)
Refill request

## 2021-09-07 DIAGNOSIS — X32XXXA Exposure to sunlight, initial encounter: Secondary | ICD-10-CM | POA: Diagnosis not present

## 2021-09-07 DIAGNOSIS — D0361 Melanoma in situ of right upper limb, including shoulder: Secondary | ICD-10-CM | POA: Diagnosis not present

## 2021-09-07 DIAGNOSIS — Z85828 Personal history of other malignant neoplasm of skin: Secondary | ICD-10-CM | POA: Diagnosis not present

## 2021-09-07 DIAGNOSIS — L570 Actinic keratosis: Secondary | ICD-10-CM | POA: Diagnosis not present

## 2021-09-07 DIAGNOSIS — D171 Benign lipomatous neoplasm of skin and subcutaneous tissue of trunk: Secondary | ICD-10-CM | POA: Diagnosis not present

## 2021-09-07 DIAGNOSIS — D485 Neoplasm of uncertain behavior of skin: Secondary | ICD-10-CM | POA: Diagnosis not present

## 2021-09-07 DIAGNOSIS — D2272 Melanocytic nevi of left lower limb, including hip: Secondary | ICD-10-CM | POA: Diagnosis not present

## 2021-09-07 DIAGNOSIS — D2262 Melanocytic nevi of left upper limb, including shoulder: Secondary | ICD-10-CM | POA: Diagnosis not present

## 2021-09-07 DIAGNOSIS — D2261 Melanocytic nevi of right upper limb, including shoulder: Secondary | ICD-10-CM | POA: Diagnosis not present

## 2021-09-17 ENCOUNTER — Other Ambulatory Visit: Payer: Self-pay | Admitting: Cardiovascular Disease

## 2021-09-30 DIAGNOSIS — L989 Disorder of the skin and subcutaneous tissue, unspecified: Secondary | ICD-10-CM | POA: Diagnosis not present

## 2021-09-30 DIAGNOSIS — D0361 Melanoma in situ of right upper limb, including shoulder: Secondary | ICD-10-CM | POA: Diagnosis not present

## 2021-10-02 DIAGNOSIS — D0361 Melanoma in situ of right upper limb, including shoulder: Secondary | ICD-10-CM | POA: Diagnosis not present

## 2021-11-02 NOTE — Progress Notes (Signed)
Cardiology Office Note ? ?Date:  11/03/2021  ? ?ID:  Paula Clark, DOB 08-Jun-1941, MRN [id number] ? ?PCP:  Derinda Late, MD  ? ?Chief Complaint  ?Patient presents with  ? 12 month follow up   ?  Patient c/o shortness of breath, weakness and fatigue with walking a short distance. Medications reviewed by the patient verbally.   ? ? ?HPI:  ?81 year old woman With history of  ?hypertension,  ?persistent atrial fibrillation,   ?CHADSVASc score of at least 4 ?syncope in the setting of dehydration,  ?Stress test 04/30/2016 no ischemia ?Attempted cardioversion for atrial fibrillation 05/18/2016, failed ? severe epistaxis right nostril 04/07/2016 ?emergency room 04/11/2016 with epistaxis x4, ? seen by ENT in the emergency room ?Admitted to the hospital 04/12/2016 ?Referred to Dr. Burt Knack for watchman device ?CRI ?On chemo for bone marrow cancer/high platelets ?Who presents for follow-up of her chronic atrial fibrillation ? ?LOV April 2022 ?In follow-up today reports having some shortness of breath on exertion ?Continues to have trace leg edema ?Does not take Lasix daily, bothered by frequent urination ? ?Discussed medications with her: ?For blood pressure, taking cardizem 240 daily, cardura 8 mg in the AM ? ?Sedentary at baseline ?On hydrea, per oncology ? ?Labs reviewed ?HGB 10.8 ?CR 1.13, BUN 23 ? ?Denies sx from atrial fibrillation ?Takes lasix daily PRN ? ?Denies any anginal symptoms ? ?Previously reported leg /back problems, comes and goes ?Rarely uses walker ? ?On Hydrea for bone marrow cancer ?History of thrombocytopenia ? ?EKG personally reviewed by myself on todays visit ?shows atrial fibrillation with ventricular rate 67 bpm nonspecific ST abnormality ? ?Other past medical history reviewed ?evaluated by Dr. Burt Knack November 2017 for watchman device ? Dr. Burt Knack was going to discuss case with ENT physician prior to scheduling TEE ?There was some concern that she would not tolerate aspirin and Plavix ?TEE was never  performed   ? ?Noted to be in atrial fibrillation in the emergency room, no prior EKGs available but patient responded at that time that she had atrial fibrillation before but was not on anticoagulation ? ?Admitted back to the hospital 04/16/2016 with fall, weakness, dehydration, creatinine 1.6 (improved with IV hydration overnight), discharge on 04/18/2016 ? ?She was seen by our office, ryan Dunn 04/21/2016, scheduled for cardioversion ?Cardioversion attempted 05/18/2016 unsuccessful.  suspect  possibly chronic ? ? ?PMH:   has a past medical history of Chronic atrial fibrillation ([place]), CKD (chronic kidney disease), stage III ([place]), Epistaxis, Gout, History of stress test, HLD (hyperlipidemia), Hypertension, Mitral regurgitation, and Syncope. ? ?PSH:    ?Past Surgical History:  ?Procedure Laterality Date  ? ABDOMINAL HYSTERECTOMY    ? APPENDECTOMY    ? ELECTROPHYSIOLOGIC STUDY N/A 05/18/2016  ? Procedure: CARDIOVERSION;  Surgeon: Minna Merritts, MD;  Location: [facility] ORS;  Service: Cardiovascular;  Laterality: N/A;  ? OOPHORECTOMY    ? TONSILLECTOMY    ? TOTAL VAGINAL HYSTERECTOMY    ? ? ?Current Outpatient Medications  ?Medication Sig Dispense Refill  ? acetaminophen (TYLENOL) 500 MG tablet Take 500-1,000 mg by mouth every 6 (six) hours as needed for moderate pain or headache.    ? cetirizine (ZYRTEC) 10 MG tablet Take 10 mg by mouth daily.    ? Cholecalciferol (VITAMIN D3) 1000 units CAPS Take 1,000 Units by mouth daily.    ? diltiazem (CARDIZEM CD) 240 MG 24 hr capsule TAKE 1 CAPSULE BY MOUTH EVERY DAY 90 capsule 0  ? doxazosin (CARDURA) 8 MG tablet Take 8 mg by mouth  daily.    ? furosemide (LASIX) 20 MG tablet Take 1 tablet (20 mg total) by mouth daily as needed. 30 tablet 3  ? hydroxyurea (HYDREA) 500 MG capsule TAKE 1 CAPSULE (500 MG TOTAL) BY MOUTH DAILY. MAY TAKE WITH FOOD TO MINIMIZE STOMACH SIDE EFFECTS. 90 capsule 1  ? loperamide (IMODIUM) 2 MG capsule Take 2 mg by mouth as needed for diarrhea or loose  stools.    ? XARELTO 15 MG TABS tablet TAKE 1 TABLET (15 MG TOTAL) BY MOUTH DAILY WITH SUPPER. 90 tablet 1  ? Cholecalciferol-Vitamin C (VITAMIN D3-VITAMIN C) 1000-500 UNIT-MG CAPS Take 1,000 Units by mouth. (Patient not taking: Reported on 07/24/2021)    ? colchicine 0.6 MG tablet Take 2 tablets (1.'2mg'$ ) by mouth at first sign of gout flare followed by 1 tablet (0.'6mg'$ ) after 1 hour. (Max 1.'8mg'$  within 1 hour) (Patient not taking: Reported on 01/23/2021)    ? ?No current facility-administered medications for this visit.  ? ? ?Allergies:   Codeine  ? ?Social History:  The patient  reports that she has quit smoking. She has never used smokeless tobacco. She reports that she does not drink alcohol and does not use drugs.  ? ?Family History:   family history includes Atrial fibrillation in her brother; Heart failure in her mother.  ? ? ?Review of Systems: ?Review of Systems  ?Constitutional: Negative.   ?HENT: Negative.    ?Respiratory: Negative.    ?Cardiovascular: Negative.   ?Gastrointestinal: Negative.   ?Musculoskeletal: Negative.   ?Neurological: Negative.   ?Psychiatric/Behavioral: Negative.    ?All other systems reviewed and are negative. ? ?PHYSICAL EXAM: ?VS:  BP (!) 158/62 (BP Location: Left Arm, Patient Position: Sitting, Cuff Size: Normal)   Ht 5' 2.5" (1.588 m)   Wt 146 lb 6 oz (66.4 kg)   SpO2 98%   BMI 26.35 kg/m?  , BMI Body mass index is 26.35 kg/m?Marland Kitchen ?Constitutional:  oriented to person, place, and time. No distress.  ?HENT:  ?Head: Grossly normal ?Eyes:  no discharge. No scleral icterus.  ?Neck: No JVD, no carotid bruits  ?Cardiovascular: Irregularly irregular, no murmurs appreciated ?Trace lower extremity edema ?Pulmonary/Chest: Clear to auscultation bilaterally, no wheezes or rails ?Abdominal: Soft.  no distension.  no tenderness.  ?Musculoskeletal: Normal range of motion ?Neurological:  normal muscle tone. Coordination normal. No atrophy ?Skin: Skin warm and dry ?Psychiatric: normal affect,  pleasant ? ?Recent Labs: ?07/24/2021: ALT 12; BUN 23; Creatinine, Ser 1.13; Hemoglobin 10.8; Platelets 550; Potassium 3.8; Sodium 135  ? ? ?Lipid Panel ?No results found for: CHOL, HDL, LDLCALC, TRIG ?  ? ?Wt Readings from Last 3 Encounters:  ?11/03/21 146 lb 6 oz (66.4 kg)  ?07/24/21 146 lb 4.8 oz (66.4 kg)  ?01/23/21 143 lb (64.9 kg)  ?  ? ? ?ASSESSMENT AND PLAN: ? ?Permanent atrial fibrillation ([place]) - Plan: EKG 12-Lead ? tolerating xarelto 15 mg daily, no bleeding ?CHADS VASC 4 ,rate well controlled ? continue diltiazem for rate control ? ?Diastolic CHF ?Recommend she take Lasix daily ?Has shortness of breath on exertion with lower extremity edema ?More prominent murmur, echocardiogram ordered ? ?Essential hypertension -  ?Continues to take Cardizem 240 morning, also takes doxazosin 8 mg in the morning ?Tried cutting the Cardura dose in half but was forgetting the evening dose, went back to 1 a day.  Reports blood pressure well controlled at home ? ?Epistaxis ?Prior history, none recently ?Tolerating anticoagulation, xarelto ?Has anemia ? ?Weakness ?Suggest walking program ? ?Murmur ?Echocardiogram  pending ? ? ? Total encounter time more than 30 minutes ? Greater than 50% was spent in counseling and coordination of care with the patient ? ? ?No orders of the defined types were placed in this encounter. ? ? ? ?Signed, ?Esmond Plants, M.D., Ph.D. ?11/03/2021  ?[place] ?[phone] ? ? ?

## 2021-11-03 ENCOUNTER — Encounter: Payer: Self-pay | Admitting: Cardiovascular Disease

## 2021-11-03 ENCOUNTER — Ambulatory Visit: Payer: Medicare HMO | Admitting: Cardiovascular Disease

## 2021-11-03 VITALS — BP 158/62 | HR 67 | Ht 62.5 in | Wt 146.4 lb

## 2021-11-03 DIAGNOSIS — R531 Weakness: Secondary | ICD-10-CM | POA: Diagnosis not present

## 2021-11-03 DIAGNOSIS — I130 Hypertensive heart and chronic kidney disease with heart failure and stage 1 through stage 4 chronic kidney disease, or unspecified chronic kidney disease: Secondary | ICD-10-CM | POA: Diagnosis not present

## 2021-11-03 DIAGNOSIS — I5030 Unspecified diastolic (congestive) heart failure: Secondary | ICD-10-CM | POA: Diagnosis not present

## 2021-11-03 DIAGNOSIS — I160 Hypertensive urgency: Secondary | ICD-10-CM

## 2021-11-03 DIAGNOSIS — R0602 Shortness of breath: Secondary | ICD-10-CM

## 2021-11-03 DIAGNOSIS — I4819 Other persistent atrial fibrillation: Secondary | ICD-10-CM | POA: Diagnosis not present

## 2021-11-03 DIAGNOSIS — I10 Essential (primary) hypertension: Secondary | ICD-10-CM

## 2021-11-03 DIAGNOSIS — R011 Cardiac murmur, unspecified: Secondary | ICD-10-CM

## 2021-11-03 NOTE — Patient Instructions (Addendum)
Medication Instructions:  ?No changes ? ?If you need a refill on your cardiac medications before your next appointment, please call your pharmacy.  ? ?Lab work: ?No new labs needed ? ? ?Testing/Procedures: ? ?1) Echocardiogram: ?- Your physician has requested that you have an echocardiogram. Echocardiography is a painless test that uses sound waves to create images of your heart. It provides your doctor with information about the size and shape of your heart and how well your heart?s chambers and valves are working. This procedure takes approximately one hour. There are no restrictions for this procedure. There is a possibility that an IV may need to be started during your test to inject an image enhancing agent. This is done to obtain more optimal pictures of your heart. Therefore we ask that you do at least drink some water prior to coming in to hydrate your veins.  ? ? ?Follow-Up: ?At [facility], you and your health needs are our priority.  As part of our continuing mission to provide you with exceptional heart care, we have created designated Provider Care Teams.  These Care Teams include your primary Cardiologist (physician) and Advanced Practice Providers (APPs -  Physician Assistants and Nurse Practitioners) who all work together to provide you with the care you need, when you need it. ? ?You will need a follow up appointment in 6 months ? ?Providers on your designated Care Team:   ?Murray Hodgkins, NP ?Christell Faith, PA-C ?Cadence Kathlen Mody, PA-C ? ?COVID-19 Vaccine Information can be found at: ShippingScam.co.uk For questions related to vaccine distribution or appointments, please email [email or web address] or call [phone].  ? ? ?Echocardiogram ?An echocardiogram is a test that uses sound waves (ultrasound) to produce images of the heart. ?Images from an echocardiogram can provide important information about: ?Heart size and shape. ?The size  and thickness and movement of your heart's walls. ?Heart muscle function and strength. ?Heart valve function or if you have stenosis. Stenosis is when the heart valves are too narrow. ?If blood is flowing backward through the heart valves (regurgitation). ?A tumor or infectious growth around the heart valves. ?Areas of heart muscle that are not working well because of poor blood flow or injury from a heart attack. ?Aneurysm detection. An aneurysm is a weak or damaged part of an artery wall. The wall bulges out from the normal force of blood pumping through the body. ?Tell a health care provider about: ?Any allergies you have. ?All medicines you are taking, including vitamins, herbs, eye drops, creams, and over-the-counter medicines. ?Any blood disorders you have. ?Any surgeries you have had. ?Any medical conditions you have. ?Whether you are pregnant or may be pregnant. ?What are the risks? ?Generally, this is a safe test. However, problems may occur, including an allergic reaction to dye (contrast) that may be used during the test. ?What happens before the test? ?No specific preparation is needed. You may eat and drink normally. ?What happens during the test? ? ?You will take off your clothes from the waist up and put on a hospital gown. ?Electrodes or electrocardiogram (ECG)patches may be placed on your chest. The electrodes or patches are then connected to a device that monitors your heart rate and rhythm. ?You will lie down on a table for an ultrasound exam. A gel will be applied to your chest to help sound waves pass through your skin. ?A handheld device, called a transducer, will be pressed against your chest and moved over your heart. The transducer produces sound waves that travel to  your heart and bounce back (or "echo" back) to the transducer. These sound waves will be captured in real-time and changed into images of your heart that can be viewed on a video monitor. The images will be recorded on a  computer and reviewed by your health care provider. ?You may be asked to change positions or hold your breath for a short time. This makes it easier to get different views or better views of your heart. ?In some cases, you may receive contrast through an IV in one of your veins. This can improve the quality of the pictures from your heart. ?The procedure may vary among health care providers and hospitals. ?What can I expect after the test? ?You may return to your normal, everyday life, including diet, activities, and medicines, unless your health care provider tells you not to do that. ?Follow these instructions at home: ?It is up to you to get the results of your test. Ask your health care provider, or the department that is doing the test, when your results will be ready. ?Keep all follow-up visits. This is important. ?Summary ?An echocardiogram is a test that uses sound waves (ultrasound) to produce images of the heart. ?Images from an echocardiogram can provide important information about the size and shape of your heart, heart muscle function, heart valve function, and other possible heart problems. ?You do not need to do anything to prepare before this test. You may eat and drink normally. ?After the echocardiogram is completed, you may return to your normal, everyday life, unless your health care provider tells you not to do that. ?This information is not intended to replace advice given to you by your health care provider. Make sure you discuss any questions you have with your health care provider. ?Document Revised: 03/11/2021 Document Reviewed: 02/19/2020 ?Elsevier Patient Education ? [place]. ? ?

## 2021-11-17 ENCOUNTER — Ambulatory Visit (INDEPENDENT_AMBULATORY_CARE_PROVIDER_SITE_OTHER): Payer: Medicare HMO

## 2021-11-17 DIAGNOSIS — R011 Cardiac murmur, unspecified: Secondary | ICD-10-CM | POA: Diagnosis not present

## 2021-11-17 DIAGNOSIS — R0602 Shortness of breath: Secondary | ICD-10-CM

## 2021-11-17 LAB — ECHOCARDIOGRAM COMPLETE
AR max vel: 2.12 cm2
AV Area VTI: 2.11 cm2
AV Area mean vel: 2.17 cm2
AV Mean grad: 5 mmHg
AV Peak grad: 8.9 mmHg
AV Vena cont: 0.4 cm
Ao pk vel: 1.49 m/s
Area-P 1/2: 4.21 cm2
Calc EF: 58.4 %
S' Lateral: 2.7 cm
Single Plane A2C EF: 54.5 %
Single Plane A4C EF: 59.6 %

## 2021-11-20 ENCOUNTER — Encounter: Payer: Self-pay | Admitting: Internal Medicine

## 2021-11-20 ENCOUNTER — Inpatient Hospital Stay: Payer: Medicare HMO | Admitting: Internal Medicine

## 2021-11-20 ENCOUNTER — Inpatient Hospital Stay: Payer: Medicare HMO | Attending: Internal Medicine

## 2021-11-20 ENCOUNTER — Other Ambulatory Visit: Payer: Self-pay

## 2021-11-20 ENCOUNTER — Telehealth: Payer: Self-pay | Admitting: Emergency Medicine

## 2021-11-20 DIAGNOSIS — I129 Hypertensive chronic kidney disease with stage 1 through stage 4 chronic kidney disease, or unspecified chronic kidney disease: Secondary | ICD-10-CM | POA: Diagnosis not present

## 2021-11-20 DIAGNOSIS — R197 Diarrhea, unspecified: Secondary | ICD-10-CM | POA: Insufficient documentation

## 2021-11-20 DIAGNOSIS — I4891 Unspecified atrial fibrillation: Secondary | ICD-10-CM | POA: Insufficient documentation

## 2021-11-20 DIAGNOSIS — N183 Chronic kidney disease, stage 3 unspecified: Secondary | ICD-10-CM | POA: Insufficient documentation

## 2021-11-20 DIAGNOSIS — D75839 Thrombocytosis, unspecified: Secondary | ICD-10-CM | POA: Insufficient documentation

## 2021-11-20 DIAGNOSIS — Z90710 Acquired absence of both cervix and uterus: Secondary | ICD-10-CM | POA: Diagnosis not present

## 2021-11-20 DIAGNOSIS — D473 Essential (hemorrhagic) thrombocythemia: Secondary | ICD-10-CM | POA: Diagnosis not present

## 2021-11-20 DIAGNOSIS — Z87891 Personal history of nicotine dependence: Secondary | ICD-10-CM | POA: Diagnosis not present

## 2021-11-20 DIAGNOSIS — Z7901 Long term (current) use of anticoagulants: Secondary | ICD-10-CM | POA: Diagnosis not present

## 2021-11-20 DIAGNOSIS — D649 Anemia, unspecified: Secondary | ICD-10-CM | POA: Diagnosis not present

## 2021-11-20 LAB — CBC WITH DIFFERENTIAL/PLATELET
Abs Immature Granulocytes: 0.06 10*3/uL (ref 0.00–0.07)
Basophils Absolute: 0.1 10*3/uL (ref 0.0–0.1)
Basophils Relative: 1 %
Eosinophils Absolute: 0.1 10*3/uL (ref 0.0–0.5)
Eosinophils Relative: 2 %
HCT: 32.9 % — ABNORMAL LOW (ref 36.0–46.0)
Hemoglobin: 10.9 g/dL — ABNORMAL LOW (ref 12.0–15.0)
Immature Granulocytes: 1 %
Lymphocytes Relative: 20 %
Lymphs Abs: 1.2 10*3/uL (ref 0.7–4.0)
MCH: 37.6 pg — ABNORMAL HIGH (ref 26.0–34.0)
MCHC: 33.1 g/dL (ref 30.0–36.0)
MCV: 113.4 fL — ABNORMAL HIGH (ref 80.0–100.0)
Monocytes Absolute: 0.9 10*3/uL (ref 0.1–1.0)
Monocytes Relative: 14 %
Neutro Abs: 3.8 10*3/uL (ref 1.7–7.7)
Neutrophils Relative %: 62 %
Platelets: 541 10*3/uL — ABNORMAL HIGH (ref 150–400)
RBC: 2.9 MIL/uL — ABNORMAL LOW (ref 3.87–5.11)
RDW: 13.5 % (ref 11.5–15.5)
WBC: 6.2 10*3/uL (ref 4.0–10.5)
nRBC: 0 % (ref 0.0–0.2)

## 2021-11-20 LAB — COMPREHENSIVE METABOLIC PANEL
ALT: 13 U/L (ref 0–44)
AST: 21 U/L (ref 15–41)
Albumin: 3.8 g/dL (ref 3.5–5.0)
Alkaline Phosphatase: 74 U/L (ref 38–126)
Anion gap: 10 (ref 5–15)
BUN: 24 mg/dL — ABNORMAL HIGH (ref 8–23)
CO2: 23 mmol/L (ref 22–32)
Calcium: 9 mg/dL (ref 8.9–10.3)
Chloride: 104 mmol/L (ref 98–111)
Creatinine, Ser: 1.27 mg/dL — ABNORMAL HIGH (ref 0.44–1.00)
GFR, Estimated: 42 mL/min — ABNORMAL LOW (ref >60)
Glucose, Bld: 138 mg/dL — ABNORMAL HIGH (ref 70–99)
Potassium: 4.5 mmol/L (ref 3.5–5.1)
Sodium: 137 mmol/L (ref 135–145)
Total Bilirubin: 0.8 mg/dL (ref 0.3–1.2)
Total Protein: 7.3 g/dL (ref 6.5–8.1)

## 2021-11-20 LAB — FERRITIN: Ferritin: 57 ng/mL (ref 11–307)

## 2021-11-20 LAB — IRON AND TIBC
Iron: 81 ug/dL (ref 28–170)
Saturation Ratios: 23 % (ref 10.4–31.8)
TIBC: 353 ug/dL (ref 250–450)
UIBC: 272 ug/dL

## 2021-11-20 LAB — LACTATE DEHYDROGENASE: LDH: 212 U/L — ABNORMAL HIGH (ref 98–192)

## 2021-11-20 NOTE — Progress Notes (Signed)
[place] ?CONSULT NOTE ? ?Patient Care Team: ?Derinda Late, MD as PCP - General (Family Medicine) ?Minna Merritts, MD as PCP - Cardiology (Cardiology) ?Minna Merritts, MD as Consulting Physician (Cardiology) ?Cammie Sickle, MD as Consulting Physician (Internal Medicine) ? ?CHIEF COMPLAINTS/PURPOSE OF CONSULTATION: ET ? ?Oncology History Overview Note  ?# ESSENTIAL THROMBOCYTOSIS [[June 2019- 993; July 2020- platelets-992 ; Hb-12 white count- 9];INTERMEDIATE RISK;July 2020-; CALR MUTATION POSITIVE; July 2020- Hydrea 500 mg/day.  ? ?# A.fib on xarelto [A.fib] ? ?# Chronic Kidney disease-III ?  ?Essential thrombocytosis ([place])  ?01/30/2019 Initial Diagnosis  ? Essential thrombocytosis ([place]) ?  ? ? ? ? ?HISTORY OF PRESENTING ILLNESS:  ?Paula Clark 81 y.o.  female pleasant patient with essential thrombocytosis currently on Hydrea is here for follow-up. ? ?Patient denies any blood in stools or black or stools.  Mild diarrhea 1-2 loose stools a day.  Denies any skin ulcerations.  No rash.  No history of strokes or blood clots. ? ?Review of Systems  ?Constitutional:  Positive for malaise/fatigue. Negative for chills, diaphoresis, fever and weight loss.  ?HENT:  Negative for nosebleeds and sore throat.   ?Eyes:  Negative for double vision.  ?Respiratory:  Negative for cough, hemoptysis, sputum production, shortness of breath and wheezing.   ?Cardiovascular:  Negative for chest pain, palpitations, orthopnea and leg swelling.  ?Gastrointestinal:  Positive for diarrhea. Negative for abdominal pain, blood in stool, constipation, heartburn, melena, nausea and vomiting.  ?Genitourinary:  Negative for dysuria, frequency and urgency.  ?Musculoskeletal:  Positive for joint pain. Negative for back pain.  ?Skin: Negative.  Negative for itching and rash.  ?Neurological:  Negative for dizziness, tingling, focal weakness, weakness and headaches.  ?Endo/Heme/Allergies:  Does not bruise/bleed easily.   ?Psychiatric/Behavioral:  Negative for depression. The patient is not nervous/anxious and does not have insomnia.   ? ? ?MEDICAL HISTORY:  ?Past Medical History:  ?Diagnosis Date  ? Chronic atrial fibrillation (HCC)   ? a. Unsuccessful DCCV 05/2016; b. CHADS2VASc => 4 (HTN, age x 2, female)--> Xarelto.  ? CKD (chronic kidney disease), stage III ([place])   ? Epistaxis   ? Gout   ? History of stress test   ? a. 04/2016 MV: EF 80%, no ischemia.  ? HLD (hyperlipidemia)   ? Hypertension   ? Mitral regurgitation   ? a. Echo 04/2016: EF 60-65%, no RWMA, mild AI, mild MR, mild biatrial enlargement, PASP 32 mmHg, trivial pericardial effusion noted posterior to the heart  ? Syncope   ? ? ?SURGICAL HISTORY: ?Past Surgical History:  ?Procedure Laterality Date  ? ABDOMINAL HYSTERECTOMY    ? APPENDECTOMY    ? ELECTROPHYSIOLOGIC STUDY N/A 05/18/2016  ? Procedure: CARDIOVERSION;  Surgeon: Minna Merritts, MD;  Location: [facility] ORS;  Service: Cardiovascular;  Laterality: N/A;  ? OOPHORECTOMY    ? TONSILLECTOMY    ? TOTAL VAGINAL HYSTERECTOMY    ? ? ?SOCIAL HISTORY: ?Social History  ? ?Socioeconomic History  ? Marital status: Widowed  ?  Spouse name: Not on file  ? Number of children: Not on file  ? Years of education: Not on file  ? Highest education level: Not on file  ?Occupational History  ? Not on file  ?Tobacco Use  ? Smoking status: Former  ? Smokeless tobacco: Never  ?Vaping Use  ? Vaping Use: Never used  ?Substance and Sexual Activity  ? Alcohol use: No  ? Drug use: No  ? Sexual activity: Not on file  ?  Other Topics Concern  ? Not on file  ?Social History Narrative  ? [name]; self; remote smoking; No alcohol; worked in [employer] retd.   ? ?Social Determinants of Health  ? ?Financial Resource Strain: Not on file  ?Food Insecurity: Not on file  ?Transportation Needs: Not on file  ?Physical Activity: Not on file  ?Stress: Not on file  ?Social Connections: Not on file  ?Intimate Partner Violence: Not on file  ? ? ?FAMILY  HISTORY: ?Family History  ?Problem Relation Age of Onset  ? Heart failure Mother   ? Atrial fibrillation Brother   ? ? ?ALLERGIES:  is allergic to codeine. ? ?MEDICATIONS:  ?Current Outpatient Medications  ?Medication Sig Dispense Refill  ? acetaminophen (TYLENOL) 500 MG tablet Take 500-1,000 mg by mouth every 6 (six) hours as needed for moderate pain or headache.    ? cetirizine (ZYRTEC) 10 MG tablet Take 10 mg by mouth daily.    ? Cholecalciferol (VITAMIN D3) 1000 units CAPS Take 1,000 Units by mouth daily.    ? Cholecalciferol-Vitamin C (VITAMIN D3-VITAMIN C) 1000-500 UNIT-MG CAPS Take 1,000 Units by mouth.    ? colchicine 0.6 MG tablet     ? diltiazem (CARDIZEM CD) 240 MG 24 hr capsule TAKE 1 CAPSULE BY MOUTH EVERY DAY 90 capsule 0  ? doxazosin (CARDURA) 8 MG tablet Take 8 mg by mouth daily.    ? furosemide (LASIX) 20 MG tablet Take 1 tablet (20 mg total) by mouth daily as needed. 30 tablet 3  ? hydroxyurea (HYDREA) 500 MG capsule TAKE 1 CAPSULE (500 MG TOTAL) BY MOUTH DAILY. MAY TAKE WITH FOOD TO MINIMIZE STOMACH SIDE EFFECTS. 90 capsule 1  ? loperamide (IMODIUM) 2 MG capsule Take 2 mg by mouth as needed for diarrhea or loose stools.    ? losartan (COZAAR) 25 MG tablet Take 25 mg by mouth daily.    ? XARELTO 15 MG TABS tablet TAKE 1 TABLET (15 MG TOTAL) BY MOUTH DAILY WITH SUPPER. 90 tablet 1  ? ?No current facility-administered medications for this visit.  ? ?  ?PHYSICAL EXAMINATION: ? ? ?Vitals:  ? 11/20/21 1421  ?BP: (!) 157/65  ?Pulse: 67  ?Temp: (!) 97.4 ?F (36.3 ?C)  ?SpO2: 99%  ? ?Filed Weights  ? 11/20/21 1421  ?Weight: 144 lb 3.2 oz (65.4 kg)  ? ? ?Physical Exam ?HENT:  ?   Head: Normocephalic and atraumatic.  ?   Mouth/Throat:  ?   Pharynx: No oropharyngeal exudate.  ?Eyes:  ?   Pupils: Pupils are equal, round, and reactive to light.  ?Cardiovascular:  ?   Rate and Rhythm: Normal rate and regular rhythm.  ?Pulmonary:  ?   Effort: Pulmonary effort is normal. No respiratory distress.  ?   Breath sounds:  Normal breath sounds. No wheezing.  ?Abdominal:  ?   General: Bowel sounds are normal. There is no distension.  ?   Palpations: Abdomen is soft. There is no mass.  ?   Tenderness: There is no abdominal tenderness. There is no guarding or rebound.  ?Musculoskeletal:     ?   General: No tenderness. Normal range of motion.  ?   Cervical back: Normal range of motion and neck supple.  ?Skin: ?   General: Skin is warm.  ?Neurological:  ?   Mental Status: She is alert and oriented to person, place, and time.  ?Psychiatric:     ?   Mood and Affect: Affect normal.  ? ? ? ?LABORATORY  DATA:  ?I have reviewed the data as listed ?Lab Results  ?Component Value Date  ? WBC 6.2 11/20/2021  ? HGB 10.9 (L) 11/20/2021  ? HCT 32.9 (L) 11/20/2021  ? MCV 113.4 (H) 11/20/2021  ? PLT 541 (H) 11/20/2021  ? ?Recent Labs  ?  01/23/21 ?1327 07/24/21 ?1337 11/20/21 ?1402  ?NA 136 135 137  ?K 4.2 3.8 4.5  ?CL 103 105 104  ?CO2 '25 25 23  '$ ?GLUCOSE 110* 99 138*  ?BUN 24* 23 24*  ?CREATININE 1.30* 1.13* 1.27*  ?CALCIUM 9.1 8.9 9.0  ?GFRNONAA 41* 49* 42*  ?PROT 7.2 7.1 7.3  ?ALBUMIN 3.9 4.0 3.8  ?AST '19 19 21  '$ ?ALT '12 12 13  '$ ?ALKPHOS 64 67 74  ?BILITOT 0.6 0.7 0.8  ? ? ? ?ECHOCARDIOGRAM COMPLETE ? ?Result Date: 11/17/2021 ?   ECHOCARDIOGRAM REPORT   Patient Name:   Paula Clark Date of Exam: 11/17/2021 Medical Rec #:  [id number]     Height:       62.5 in Accession #:    [id number]    Weight:       146.4 lb Date of Birth:  [birth year]     BSA:          1.684 m? Patient Age:    41 years      BP:           150/68 mmHg Patient Gender: F             HR:           60 bpm. Exam Location:  [place] Procedure: 2D Echo, Cardiac Doppler and Color Doppler Indications:    R01.1 Murmur; R06.02 SOB  History:        Patient has prior history of Echocardiogram examinations, most                 recent 04/13/2016. Arrythmias:Atrial Fibrillation,                 Signs/Symptoms:Shortness of Breath and Murmur; Risk                 Factors:Hypertension and Former Smoker.   Sonographer:    Pilar Jarvis RDMS, RVT, RDCS Referring Phys: [place]  1. Left ventricular ejection fraction, by estimation, is 60 to 65%. The left ventricle has normal function. The lef

## 2021-11-20 NOTE — Telephone Encounter (Signed)
Called patient, went over results and recommendations. Pt verbalized understanding, and questions, if any, were answered.  ?

## 2021-11-20 NOTE — Telephone Encounter (Signed)
-----   Message from Minna Merritts, MD sent at 11/18/2021  9:35 AM EDT ----- ?Echocardiogram ?Normal left and right ventricular size and function ?No significant valvular heart disease ?There is mildly elevated right heart pressures ?These mild high pressures could contribute to shortness of breath and ankle swelling ?Would take Lasix as needed for swelling,  ?might help the breathing to some degree ?

## 2021-11-20 NOTE — Assessment & Plan Note (Signed)
#  Essential thrombocytosis-CALR positive; Intermediate risk given her age; on hydrea 500 mg/day; tolerating well.   I am not concerned about progressive myelofibrosis/acute leukemia. ? ?# Platelets today 541; Hemoglobin slightly low at 10.9 white count normal. Continue hydrea '500mg'$ /day. STABLE ? ?#Anemia-hemoglobin ~10-11- ?  Secondary CKD/Hydrea. Recommend oral iron/gentle.  ? ?# #CKD stage III-[GFR-42s]; on diuretic- -STABLE. ? ?#Diarrhea-grade 1 Hydrea.  Monitor for now.  Continue anti-diarrheals as needed. ? ?# A.fib- on xarelto; -STABLE. ? ?# DISPOSITION:  ?# add iron studies/ferritin ?# follow up in 4 month-MD-labs; cbc/cmp/LDH- Dr.B ?

## 2021-12-08 ENCOUNTER — Other Ambulatory Visit: Payer: Self-pay | Admitting: Internal Medicine

## 2021-12-08 ENCOUNTER — Other Ambulatory Visit: Payer: Self-pay | Admitting: Cardiovascular Disease

## 2021-12-09 NOTE — Telephone Encounter (Signed)
CBC with Differential/Platelet Order: [id number] Status: Final result    Visible to patient: Yes (not seen)    Next appt: 03/23/2022 at 02:30 PM in Oncology ([facility])    Dx: Essential thrombocytosis ([place])    0 Result Notes           Component Ref Range & Units 2 wk ago (11/20/21) 4 mo ago (07/24/21) 10 mo ago (01/23/21) 1 yr ago (09/23/20) 1 yr ago (05/27/20) 1 yr ago (01/23/20) 2 yr ago (09/26/19)  WBC 4.0 - 10.5 K/uL 6.2  5.6  5.0  6.8  5.4  4.5  6.0   RBC 3.87 - 5.11 MIL/uL 2.90 Low   2.88 Low   3.03 Low   3.00 Low   2.89 Low   2.88 Low   3.26 Low    Hemoglobin 12.0 - 15.0 g/dL 10.9 Low   10.8 Low   11.3 Low   11.2 Low   11.2 Low   11.2 Low   12.0   HCT 36.0 - 46.0 % 32.9 Low   32.4 Low   33.9 Low   33.5 Low   32.5 Low   32.1 Low   35.9 Low    MCV 80.0 - 100.0 fL 113.4 High   112.5 High   111.9 High   111.7 High   112.5 High   111.5 High   110.1 High    MCH 26.0 - 34.0 pg 37.6 High   37.5 High   37.3 High   37.3 High   38.8 High   38.9 High   36.8 High    MCHC 30.0 - 36.0 g/dL 33.1  33.3  33.3  33.4  34.5  34.9  33.4   RDW 11.5 - 15.5 % 13.5  13.3  13.5  13.0  12.4  13.2  13.6   Platelets 150 - 400 K/uL 541 High   550 High   547 High   501 High   471 High   369  450 High    nRBC 0.0 - 0.2 % 0.0  0.0  0.0  0.0  0.0  0.0  0.0   Neutrophils Relative % % 62  55  57  67  58  64  53   Neutro Abs 1.7 - 7.7 K/uL 3.8  3.1  2.8  4.5  3.2  2.9  3.2   Lymphocytes Relative % '20  27  24  19  26  21  31   '$ Lymphs Abs 0.7 - 4.0 K/uL 1.2  1.5  1.2  1.3  1.4  1.0  1.9   Monocytes Relative % '14  14  15  12  13  13  13   '$ Monocytes Absolute 0.1 - 1.0 K/uL 0.9  0.8  0.8  0.8  0.7  0.6  0.8   Eosinophils Relative % '2  2  2  1  1  1  1   '$ Eosinophils Absolute 0.0 - 0.5 K/uL 0.1  0.1  0.1  0.1  0.1  0.0  0.1   Basophils Relative % '1  1  1  '$ 0  1  0  1   Basophils Absolute 0.0 - 0.1 K/uL 0.1  0.0  0.1  0.0  0.0  0.0  0.0   Immature Granulocytes % '1  1  1  1  1  1  1   '$ Abs Immature Granulocytes 0.00 - 0.07  K/uL 0.06  0.04 CM  0.04 CM  0.05 CM  0.03 CM  0.04 CM  0.04 CM   Comment: Performed at [facility], [address] [place], [place] [address]  [place]  [facility] CL[facility] LAB [place] [facility] [place] [facility] [place] [facility] [place] [facility] [place] [facility] [place] [facility]         Specimen Collected: 11/20/21 14:02 Last Resulted: 11/20/21 14:12      Lab Flowsheet    Order Details    View Encounter    Lab and Collection Details    Routing    Result History    View Encounter Conversation      CM=Additional comments      Result Care Coordination   Patient Communication   Add Comments   Add Notifications  Back to Top       Other Results from 11/20/2021   Contains abnormal data Lactate dehydrogenase Order: [id number] Status: Final result    Visible to patient: Yes (not seen)    Next appt: 03/23/2022 at 02:30 PM in Oncology ([facility])    Dx: Essential thrombocytosis ([place])    0 Result Notes       Component Ref Range & Units 2 wk ago 4 mo ago 2 yr ago  LDH 98 - 192 U/L 212 High   232 High  CM  242 High  CM   Comment: Performed at [facility], [address] [place], [place] [address]  Resulting Agency  [facility] CLIN LAB [facility] CLIN LAB [facility] CLIN LAB         Specimen Collected: 11/20/21 14:02 Last Resulted: 11/20/21 14:25      Lab Flowsheet    Order Details    View Encounter    Lab and Collection Details    Routing    Result History    View Encounter Conversation      CM=Additional comments      Result Care Coordination   Patient Communication   Add Comments   Add Notifications  Back to Top          Contains abnormal data Comprehensive metabolic panel Order: [id number] Status: Final result    Visible to patient: Yes (not seen)    Next appt: 03/23/2022 at 02:30 PM in Oncology ([facility])    Dx: Essential thrombocytosis ([place])    0 Result Notes           Component Ref Range & Units 2 wk ago (11/20/21) 4 mo ago (07/24/21) 10 mo ago (01/23/21) 1 yr ago (09/23/20) 1 yr  ago (05/27/20) 1 yr ago (01/23/20) 2 yr ago (09/26/19)  Sodium 135 - 145 mmol/L 137  135  136  139  138  137  138   Potassium 3.5 - 5.1 mmol/L 4.5  3.8  4.2  3.8  3.5  3.9  3.9   Chloride 98 - 111 mmol/L 104  105  103  104  103  103  104   CO2 22 - 32 mmol/L '23  25  25  22  27  26  26   '$ Glucose, Bld 70 - 99 mg/dL 138 High   99 CM  110 High  CM  167 High  CM  137 High  CM  145 High  CM  117 High  CM   Comment: Glucose reference range applies only to samples taken after fasting for at least 8 hours.  BUN 8 - 23 mg/dL 24 High   23  24 High   23  20  21  23   Creatinine, Ser 0.44 - 1.00 mg/dL 1.27 High   1.13 High   1.30 High   1.53 High   1.40 High   1.41 High   1.43 High    Calcium 8.9 - 10.3 mg/dL 9.0  8.9  9.1  9.2  9.6  9.0  9.4   Total Protein 6.5 - 8.1 g/dL 7.3  7.1  7.2  7.3  7.1  7.1  7.2   Albumin 3.5 - 5.0 g/dL 3.8  4.0  3.9  3.8  3.7  3.9  4.1   AST 15 - 41 U/L '21  19  19  20  17  18  19   '$ ALT 0 - 44 U/L '13  12  12  13  11  13  12   '$ Alkaline Phosphatase 38 - 126 U/L 74  67  64  68  68  75  73   Total Bilirubin 0.3 - 1.2 mg/dL 0.8  0.7  0.6  0.9  0.7  0.6  0.8   GFR, Estimated >60 mL/min 42 Low   49 Low  CM  41 Low  CM  34 Low  CM  38 Low  CM     Comment: (NOTE)  Calculated using the CKD-EPI Creatinine Equation (2021)   Anion gap 5 - '15 10  5 '$ CM  8 CM  13 CM  8 CM  8 CM  8 CM   Comment: Performed at [facility], [address] [place], [place] [address]  Resulting Agency  [facility] CLIN LAB [place] CLIN LAB [place] CLIN LAB [place] [facility] LAB [place] [facility] [place] [facility] LAB [place] [facility] LAB         Specimen Collected: 11/20/21 14:02 Last Resulted: 11/20/21 14:25

## 2022-01-14 DIAGNOSIS — I10 Essential (primary) hypertension: Secondary | ICD-10-CM | POA: Diagnosis not present

## 2022-01-14 DIAGNOSIS — N1832 Chronic kidney disease, stage 3b: Secondary | ICD-10-CM | POA: Diagnosis not present

## 2022-01-25 DIAGNOSIS — Z0000 Encounter for general adult medical examination without abnormal findings: Secondary | ICD-10-CM | POA: Diagnosis not present

## 2022-01-25 DIAGNOSIS — Z1331 Encounter for screening for depression: Secondary | ICD-10-CM | POA: Diagnosis not present

## 2022-02-01 ENCOUNTER — Other Ambulatory Visit: Payer: Self-pay | Admitting: Cardiovascular Disease

## 2022-02-18 ENCOUNTER — Other Ambulatory Visit: Payer: Self-pay | Admitting: Family Medicine

## 2022-02-18 DIAGNOSIS — Z1231 Encounter for screening mammogram for malignant neoplasm of breast: Secondary | ICD-10-CM

## 2022-03-16 ENCOUNTER — Ambulatory Visit
Admission: RE | Admit: 2022-03-16 | Discharge: 2022-03-16 | Disposition: A | Discharge status: Home / Self Care | Payer: Medicare HMO | Source: Ambulatory Visit | Attending: Family Medicine | Admitting: Family Medicine

## 2022-03-16 DIAGNOSIS — Z1231 Encounter for screening mammogram for malignant neoplasm of breast: Secondary | ICD-10-CM | POA: Diagnosis not present

## 2022-03-23 ENCOUNTER — Inpatient Hospital Stay: Payer: Medicare HMO

## 2022-03-23 ENCOUNTER — Inpatient Hospital Stay: Payer: Medicare HMO | Attending: Internal Medicine | Admitting: Internal Medicine

## 2022-03-23 ENCOUNTER — Encounter: Payer: Self-pay | Admitting: Internal Medicine

## 2022-03-23 DIAGNOSIS — Z90710 Acquired absence of both cervix and uterus: Secondary | ICD-10-CM | POA: Insufficient documentation

## 2022-03-23 DIAGNOSIS — D473 Essential (hemorrhagic) thrombocythemia: Secondary | ICD-10-CM | POA: Diagnosis not present

## 2022-03-23 DIAGNOSIS — I482 Chronic atrial fibrillation, unspecified: Secondary | ICD-10-CM | POA: Insufficient documentation

## 2022-03-23 DIAGNOSIS — D75839 Thrombocytosis, unspecified: Secondary | ICD-10-CM | POA: Diagnosis not present

## 2022-03-23 DIAGNOSIS — I129 Hypertensive chronic kidney disease with stage 1 through stage 4 chronic kidney disease, or unspecified chronic kidney disease: Secondary | ICD-10-CM | POA: Diagnosis not present

## 2022-03-23 DIAGNOSIS — D631 Anemia in chronic kidney disease: Secondary | ICD-10-CM | POA: Diagnosis not present

## 2022-03-23 DIAGNOSIS — N183 Chronic kidney disease, stage 3 unspecified: Secondary | ICD-10-CM | POA: Insufficient documentation

## 2022-03-23 DIAGNOSIS — Z7901 Long term (current) use of anticoagulants: Secondary | ICD-10-CM | POA: Diagnosis not present

## 2022-03-23 DIAGNOSIS — R197 Diarrhea, unspecified: Secondary | ICD-10-CM | POA: Diagnosis not present

## 2022-03-23 LAB — CBC WITH DIFFERENTIAL/PLATELET
Abs Immature Granulocytes: 0.05 10*3/uL (ref 0.00–0.07)
Basophils Absolute: 0.1 10*3/uL (ref 0.0–0.1)
Basophils Relative: 1 %
Eosinophils Absolute: 0.1 10*3/uL (ref 0.0–0.5)
Eosinophils Relative: 2 %
HCT: 32.7 % — ABNORMAL LOW (ref 36.0–46.0)
Hemoglobin: 10.7 g/dL — ABNORMAL LOW (ref 12.0–15.0)
Immature Granulocytes: 1 %
Lymphocytes Relative: 23 %
Lymphs Abs: 1.5 10*3/uL (ref 0.7–4.0)
MCH: 37 pg — ABNORMAL HIGH (ref 26.0–34.0)
MCHC: 32.7 g/dL (ref 30.0–36.0)
MCV: 113.1 fL — ABNORMAL HIGH (ref 80.0–100.0)
Monocytes Absolute: 0.9 10*3/uL (ref 0.1–1.0)
Monocytes Relative: 14 %
Neutro Abs: 4 10*3/uL (ref 1.7–7.7)
Neutrophils Relative %: 59 %
Platelets: 607 10*3/uL — ABNORMAL HIGH (ref 150–400)
RBC: 2.89 MIL/uL — ABNORMAL LOW (ref 3.87–5.11)
RDW: 14.1 % (ref 11.5–15.5)
WBC: 6.6 10*3/uL (ref 4.0–10.5)
nRBC: 0 % (ref 0.0–0.2)

## 2022-03-23 LAB — LACTATE DEHYDROGENASE: LDH: 223 U/L — ABNORMAL HIGH (ref 98–192)

## 2022-03-23 LAB — COMPREHENSIVE METABOLIC PANEL
ALT: 13 U/L (ref 0–44)
AST: 20 U/L (ref 15–41)
Albumin: 3.8 g/dL (ref 3.5–5.0)
Alkaline Phosphatase: 64 U/L (ref 38–126)
Anion gap: 3 — ABNORMAL LOW (ref 5–15)
BUN: 20 mg/dL (ref 8–23)
CO2: 26 mmol/L (ref 22–32)
Calcium: 9 mg/dL (ref 8.9–10.3)
Chloride: 104 mmol/L (ref 98–111)
Creatinine, Ser: 1.41 mg/dL — ABNORMAL HIGH (ref 0.44–1.00)
GFR, Estimated: 37 mL/min — ABNORMAL LOW (ref >60)
Glucose, Bld: 113 mg/dL — ABNORMAL HIGH (ref 70–99)
Potassium: 4.2 mmol/L (ref 3.5–5.1)
Sodium: 133 mmol/L — ABNORMAL LOW (ref 135–145)
Total Bilirubin: 0.6 mg/dL (ref 0.3–1.2)
Total Protein: 7.2 g/dL (ref 6.5–8.1)

## 2022-03-23 MED ORDER — HYDROXYUREA 500 MG PO CAPS: 500.0000 mg | ORAL_CAPSULE | Freq: Two times a day (BID) | ORAL | 1 refills | Status: DC

## 2022-03-23 NOTE — Progress Notes (Signed)
[place] NOTE  Patient Care Team: Derinda Late, MD as PCP - General (Family Medicine) Rockey Situ Kathlene November, MD as PCP - Cardiology (Cardiology) Minna Merritts, MD as Consulting Physician (Cardiology) Cammie Sickle, MD as Consulting Physician (Internal Medicine)  CHIEF COMPLAINTS/PURPOSE OF CONSULTATION: ET  Oncology History Overview Note  # ESSENTIAL THROMBOCYTOSIS [name] 2019- 684; July 2020- platelets-992 ; Hb-12 white count- 9];INTERMEDIATE RISK;July 2020-; CALR MUTATION POSITIVE; July 2020- Hydrea 500 mg/day.   # A.fib on xarelto [A.fib]  # Chronic Kidney disease-III   Essential thrombocytosis ([place])  01/30/2019 Initial Diagnosis   Essential thrombocytosis ([place])       HISTORY OF PRESENTING ILLNESS:  Paula Clark 81 y.o.  female pleasant patient with essential thrombocytosis currently on Hydrea is here for follow-up.  Patient denies any blood in stools or black or stools.  Mild diarrhea 1-2 loose stools a day.  Diarrhea is better off iron tablets.  Denies any skin ulcerations.  No rash.  No history of strokes or blood clots.  Review of Systems  Constitutional:  Positive for malaise/fatigue. Negative for chills, diaphoresis, fever and weight loss.  HENT:  Negative for nosebleeds and sore throat.   Eyes:  Negative for double vision.  Respiratory:  Negative for cough, hemoptysis, sputum production, shortness of breath and wheezing.   Cardiovascular:  Negative for chest pain, palpitations, orthopnea and leg swelling.  Gastrointestinal:  Positive for diarrhea. Negative for abdominal pain, blood in stool, constipation, heartburn, melena, nausea and vomiting.  Genitourinary:  Negative for dysuria, frequency and urgency.  Musculoskeletal:  Positive for joint pain. Negative for back pain.  Skin: Negative.  Negative for itching and rash.  Neurological:  Negative for dizziness, tingling, focal weakness, weakness and headaches.   Endo/Heme/Allergies:  Does not bruise/bleed easily.  Psychiatric/Behavioral:  Negative for depression. The patient is not nervous/anxious and does not have insomnia.      MEDICAL HISTORY:  Past Medical History:  Diagnosis Date   Chronic atrial fibrillation ([place])    a. Unsuccessful DCCV 05/2016; b. CHADS2VASc => 4 (HTN, age x 2, female)--> Xarelto.   CKD (chronic kidney disease), stage III (HCC)    Epistaxis    Gout    History of stress test    a. 04/2016 MV: EF 80%, no ischemia.   HLD (hyperlipidemia)    Hypertension    Mitral regurgitation    a. Echo 04/2016: EF 60-65%, no RWMA, mild AI, mild MR, mild biatrial enlargement, PASP 32 mmHg, trivial pericardial effusion noted posterior to the heart   Syncope     SURGICAL HISTORY: Past Surgical History:  Procedure Laterality Date   ABDOMINAL HYSTERECTOMY     APPENDECTOMY     ELECTROPHYSIOLOGIC STUDY N/A 05/18/2016   Procedure: CARDIOVERSION;  Surgeon: Minna Merritts, MD;  Location: [facility] ORS;  Service: Cardiovascular;  Laterality: N/A;   OOPHORECTOMY     TONSILLECTOMY     TOTAL VAGINAL HYSTERECTOMY      SOCIAL HISTORY: Social History   Socioeconomic History   Marital status: Widowed    Spouse name: Not on file   Number of children: Not on file   Years of education: Not on file   Highest education level: Not on file  Occupational History   Not on file  Tobacco Use   Smoking status: Former   Smokeless tobacco: Never  Vaping Use   Vaping Use: Never used  Substance and Sexual Activity   Alcohol use: No   Drug use: No  Sexual activity: Not on file  Other Topics Concern   Not on file  Social History Narrative   [name]; self; remote smoking; No alcohol; worked in [employer] retd.    Social Determinants of Health   Financial Resource Strain: Not on file  Food Insecurity: Not on file  Transportation Needs: Not on file  Physical Activity: Not on file  Stress: Not on file  Social Connections: Not on file   Intimate Partner Violence: Not on file    FAMILY HISTORY: Family History  Problem Relation Age of Onset   Heart failure Mother    Atrial fibrillation Brother     ALLERGIES:  is allergic to codeine.  MEDICATIONS:  Current Outpatient Medications  Medication Sig Dispense Refill   acetaminophen (TYLENOL) 500 MG tablet Take 500-1,000 mg by mouth every 6 (six) hours as needed for moderate pain or headache.     cetirizine (ZYRTEC) 10 MG tablet Take 10 mg by mouth daily.     Cholecalciferol (VITAMIN D3) 1000 units CAPS Take 1,000 Units by mouth daily.     Cholecalciferol-Vitamin C (VITAMIN D3-VITAMIN C) 1000-500 UNIT-MG CAPS Take 1,000 Units by mouth.     colchicine 0.6 MG tablet      diltiazem (CARDIZEM CD) 240 MG 24 hr capsule TAKE 1 CAPSULE BY MOUTH EVERY DAY 90 capsule 0   doxazosin (CARDURA) 8 MG tablet Take 8 mg by mouth daily.     furosemide (LASIX) 20 MG tablet Take 1 tablet (20 mg total) by mouth daily as needed. 30 tablet 3   loperamide (IMODIUM) 2 MG capsule Take 2 mg by mouth as needed for diarrhea or loose stools.     losartan (COZAAR) 25 MG tablet Take 25 mg by mouth daily.     XARELTO 15 MG TABS tablet TAKE 1 TABLET (15 MG TOTAL) BY MOUTH DAILY WITH SUPPER. 90 tablet 1   hydroxyurea (HYDREA) 500 MG capsule Take 1 capsule (500 mg total) by mouth 2 (two) times daily. May take with food to minimize GI side effects. 120 capsule 1   No current facility-administered medications for this visit.     PHYSICAL EXAMINATION:   Vitals:   03/23/22 1400  BP: (!) 176/66  Pulse: (!) 16  Resp: 16  Temp: (!) 96.8 F (36 C)   Filed Weights   03/23/22 1400  Weight: 143 lb 9.6 oz (65.1 kg)    Physical Exam HENT:     Head: Normocephalic and atraumatic.     Mouth/Throat:     Pharynx: No oropharyngeal exudate.  Eyes:     Pupils: Pupils are equal, round, and reactive to light.  Cardiovascular:     Rate and Rhythm: Normal rate and regular rhythm.  Pulmonary:     Effort:  Pulmonary effort is normal. No respiratory distress.     Breath sounds: Normal breath sounds. No wheezing.  Abdominal:     General: Bowel sounds are normal. There is no distension.     Palpations: Abdomen is soft. There is no mass.     Tenderness: There is no abdominal tenderness. There is no guarding or rebound.  Musculoskeletal:        General: No tenderness. Normal range of motion.     Cervical back: Normal range of motion and neck supple.  Skin:    General: Skin is warm.  Neurological:     Mental Status: She is alert and oriented to person, place, and time.  Psychiatric:  Mood and Affect: Affect normal.      LABORATORY DATA:  I have reviewed the data as listed Lab Results  Component Value Date   WBC 6.6 03/23/2022   HGB 10.7 (L) 03/23/2022   HCT 32.7 (L) 03/23/2022   MCV 113.1 (H) 03/23/2022   PLT 607 (H) 03/23/2022   Recent Labs    07/24/21 1337 11/20/21 1402 03/23/22 1427  NA 135 137 133*  K 3.8 4.5 4.2  CL 105 104 104  CO2 '25 23 26  '$ GLUCOSE 99 138* 113*  BUN 23 24* 20  CREATININE 1.13* 1.27* 1.41*  CALCIUM 8.9 9.0 9.0  GFRNONAA 49* 42* 37*  PROT 7.1 7.3 7.2  ALBUMIN 4.0 3.8 3.8  AST '19 21 20  '$ ALT '12 13 13  '$ ALKPHOS 67 74 64  BILITOT 0.7 0.8 0.6     MM 3D SCREEN BREAST BILATERAL  Result Date: 03/17/2022 CLINICAL DATA:  Screening. EXAM: DIGITAL SCREENING BILATERAL MAMMOGRAM WITH TOMOSYNTHESIS AND CAD TECHNIQUE: Bilateral screening digital craniocaudal and mediolateral oblique mammograms were obtained. Bilateral screening digital breast tomosynthesis was performed. The images were evaluated with computer-aided detection. COMPARISON:  Previous exam(s). ACR Breast Density Category a: The breast tissue is almost entirely fatty. FINDINGS: There are no findings suspicious for malignancy. IMPRESSION: No mammographic evidence of malignancy. A result letter of this screening mammogram will be mailed directly to the patient. RECOMMENDATION: Screening mammogram in  one year. (Code:SM-B-01Y) BI-RADS CATEGORY  1: Negative. Electronically Signed   By: Kristopher Oppenheim M.D.   On: 03/17/2022 13:17    ASSESSMENT & PLAN:   Essential thrombocytosis (HCC) #Essential thrombocytosis-CALR positive; Intermediate risk given her age; on hydrea 500 mg/day;   I am not concerned about progressive myelofibrosis/acute leukemia.  # Platelets today 604; Hemoglobin slightly low at 10.9 white count normal. Recommend increasing hydrea '500mg'$  BID. New script sent.   # Anemia-hemoglobin ~10-11- ?  Secondary CKD/Hydrea. HOLD off gentle iron sec to diarrhea. Monitor for now.    # #CKD stage III-[GFR-42s]; on diuretic- -STABLE.  #Diarrhea-grade 1 Hydrea- STABLE.   # A.fib- on xarelto; -STABLE .  # DISPOSITION:  # follow up in 2 month-MD-labs; cbc/cmp/LDH- Dr.B    Cammie Sickle, MD 03/23/2022 8:40 PM

## 2022-03-23 NOTE — Progress Notes (Signed)
Did take oral iron for 60 days as advised at last visit but did not tolerate due to diarrhea.

## 2022-03-23 NOTE — Patient Instructions (Signed)
Take 1 pill of hydrea twice a day.

## 2022-03-23 NOTE — Assessment & Plan Note (Addendum)
#  Essential thrombocytosis-CALR positive; Intermediate risk given her age; on hydrea 500 mg/day;   I am not concerned about progressive myelofibrosis/acute leukemia.  # Platelets today 604; Hemoglobin slightly low at 10.9 white count normal. Recommend increasing hydrea '500mg'$  BID. New script sent.   # Anemia-hemoglobin ~10-11- ?  Secondary CKD/Hydrea. HOLD off gentle iron sec to diarrhea. Monitor for now.    # #CKD stage III-[GFR-42s]; on diuretic- -STABLE.  #Diarrhea-grade 1 Hydrea-currently off iron tablets improved monitor for now.  STABLE.   # A.fib- on xarelto; -STABLE .  # DISPOSITION:  # follow up in 2 month-MD-labs; cbc/cmp/LDH- Dr.B

## 2022-05-07 ENCOUNTER — Other Ambulatory Visit: Payer: Self-pay | Admitting: Internal Medicine

## 2022-05-07 ENCOUNTER — Other Ambulatory Visit: Payer: Self-pay | Admitting: Cardiovascular Disease

## 2022-05-07 DIAGNOSIS — I4819 Other persistent atrial fibrillation: Secondary | ICD-10-CM

## 2022-05-07 NOTE — Telephone Encounter (Signed)
Last visit 03/23/22: # Platelets today 604; Hemoglobin slightly low at 10.9 white count normal. Recommend increasing hydrea '500mg'$  BID. New script sent.   Next visit 05/24/2022

## 2022-05-07 NOTE — Telephone Encounter (Signed)
Prescription refill request for Xarelto received.  Indication: afib  Last office visit: [place], 11/03/2021 Weight: 65.1 kg  Age: 81 yo  Scr: 1.3, 01/14/2022 CrCl: 34 ml/min   Refill sent.

## 2022-05-07 NOTE — Telephone Encounter (Signed)
Refill request

## 2022-05-18 ENCOUNTER — Telehealth: Payer: Self-pay | Admitting: *Deleted

## 2022-05-18 NOTE — Telephone Encounter (Signed)
Patient called asking if Dr B would order her something for nausea as she has had nausea for a few days now. Please send prescription to [facility] in [place]

## 2022-05-19 ENCOUNTER — Other Ambulatory Visit: Payer: Self-pay | Admitting: Medical Oncology

## 2022-05-19 MED ORDER — ONDANSETRON HCL 4 MG PO TABS: 4.0000 mg | ORAL_TABLET | Freq: Three times a day (TID) | ORAL | 0 refills | Status: AC | PRN

## 2022-05-24 ENCOUNTER — Encounter: Payer: Self-pay | Admitting: Internal Medicine

## 2022-05-24 ENCOUNTER — Inpatient Hospital Stay: Payer: Medicare HMO

## 2022-05-24 ENCOUNTER — Inpatient Hospital Stay: Payer: Medicare HMO | Attending: Internal Medicine | Admitting: Internal Medicine

## 2022-05-24 ENCOUNTER — Other Ambulatory Visit: Payer: Self-pay

## 2022-05-24 VITALS — BP 140/47 | HR 74 | Temp 98.6°F | Resp 18 | Wt 144.0 lb

## 2022-05-24 DIAGNOSIS — D638 Anemia in other chronic diseases classified elsewhere: Secondary | ICD-10-CM

## 2022-05-24 DIAGNOSIS — D649 Anemia, unspecified: Secondary | ICD-10-CM | POA: Diagnosis not present

## 2022-05-24 DIAGNOSIS — D473 Essential (hemorrhagic) thrombocythemia: Secondary | ICD-10-CM

## 2022-05-24 DIAGNOSIS — I129 Hypertensive chronic kidney disease with stage 1 through stage 4 chronic kidney disease, or unspecified chronic kidney disease: Secondary | ICD-10-CM | POA: Diagnosis not present

## 2022-05-24 DIAGNOSIS — D75839 Thrombocytosis, unspecified: Secondary | ICD-10-CM | POA: Diagnosis not present

## 2022-05-24 DIAGNOSIS — N183 Chronic kidney disease, stage 3 unspecified: Secondary | ICD-10-CM | POA: Diagnosis not present

## 2022-05-24 DIAGNOSIS — Z90710 Acquired absence of both cervix and uterus: Secondary | ICD-10-CM | POA: Insufficient documentation

## 2022-05-24 DIAGNOSIS — I482 Chronic atrial fibrillation, unspecified: Secondary | ICD-10-CM | POA: Insufficient documentation

## 2022-05-24 DIAGNOSIS — D61818 Other pancytopenia: Secondary | ICD-10-CM | POA: Insufficient documentation

## 2022-05-24 DIAGNOSIS — R197 Diarrhea, unspecified: Secondary | ICD-10-CM | POA: Diagnosis not present

## 2022-05-24 DIAGNOSIS — Z7901 Long term (current) use of anticoagulants: Secondary | ICD-10-CM | POA: Diagnosis not present

## 2022-05-24 DIAGNOSIS — D61811 Other drug-induced pancytopenia: Secondary | ICD-10-CM | POA: Diagnosis not present

## 2022-05-24 DIAGNOSIS — Z87891 Personal history of nicotine dependence: Secondary | ICD-10-CM | POA: Diagnosis not present

## 2022-05-24 LAB — CBC WITH DIFFERENTIAL/PLATELET
Abs Immature Granulocytes: 0.01 10*3/uL (ref 0.00–0.07)
Abs Immature Granulocytes: 0.03 10*3/uL (ref 0.00–0.07)
Basophils Absolute: 0 10*3/uL (ref 0.0–0.1)
Basophils Absolute: 0 10*3/uL (ref 0.0–0.1)
Basophils Relative: 0 %
Basophils Relative: 1 %
Eosinophils Absolute: 0 10*3/uL (ref 0.0–0.5)
Eosinophils Absolute: 0 10*3/uL (ref 0.0–0.5)
Eosinophils Relative: 1 %
Eosinophils Relative: 1 %
HCT: 19.3 % — ABNORMAL LOW (ref 36.0–46.0)
HCT: 19.2 % — ABNORMAL LOW (ref 36.0–46.0)
Hemoglobin: 6.5 g/dL — ABNORMAL LOW (ref 12.0–15.0)
Hemoglobin: 6.4 g/dL — ABNORMAL LOW (ref 12.0–15.0)
Immature Granulocytes: 1 %
Immature Granulocytes: 2 %
Lymphocytes Relative: 46 %
Lymphocytes Relative: 47 %
Lymphs Abs: 0.7 10*3/uL (ref 0.7–4.0)
Lymphs Abs: 0.7 10*3/uL (ref 0.7–4.0)
MCH: 40.4 pg — ABNORMAL HIGH (ref 26.0–34.0)
MCH: 39.5 pg — ABNORMAL HIGH (ref 26.0–34.0)
MCHC: 33.7 g/dL (ref 30.0–36.0)
MCHC: 33.3 g/dL (ref 30.0–36.0)
MCV: 119.9 fL — ABNORMAL HIGH (ref 80.0–100.0)
MCV: 118.5 fL — ABNORMAL HIGH (ref 80.0–100.0)
Monocytes Absolute: 0.1 10*3/uL (ref 0.1–1.0)
Monocytes Absolute: 0.1 10*3/uL (ref 0.1–1.0)
Monocytes Relative: 7 %
Monocytes Relative: 8 %
Neutro Abs: 0.7 10*3/uL — ABNORMAL LOW (ref 1.7–7.7)
Neutro Abs: 0.6 10*3/uL — ABNORMAL LOW (ref 1.7–7.7)
Neutrophils Relative %: 45 %
Neutrophils Relative %: 41 %
Platelets: 138 10*3/uL — ABNORMAL LOW (ref 150–400)
Platelets: 140 10*3/uL — ABNORMAL LOW (ref 150–400)
RBC: 1.61 MIL/uL — ABNORMAL LOW (ref 3.87–5.11)
RBC: 1.62 MIL/uL — ABNORMAL LOW (ref 3.87–5.11)
RDW: 18.8 % — ABNORMAL HIGH (ref 11.5–15.5)
RDW: 18.4 % — ABNORMAL HIGH (ref 11.5–15.5)
Smear Review: NORMAL
Smear Review: NORMAL
WBC: 1.5 10*3/uL — ABNORMAL LOW (ref 4.0–10.5)
WBC: 1.5 10*3/uL — ABNORMAL LOW (ref 4.0–10.5)
nRBC: 0 % (ref 0.0–0.2)
nRBC: 0 % (ref 0.0–0.2)

## 2022-05-24 LAB — COMPREHENSIVE METABOLIC PANEL
ALT: 11 U/L (ref 0–44)
AST: 19 U/L (ref 15–41)
Albumin: 3.8 g/dL (ref 3.5–5.0)
Alkaline Phosphatase: 61 U/L (ref 38–126)
Anion gap: 8 (ref 5–15)
BUN: 30 mg/dL — ABNORMAL HIGH (ref 8–23)
CO2: 23 mmol/L (ref 22–32)
Calcium: 8.8 mg/dL — ABNORMAL LOW (ref 8.9–10.3)
Chloride: 103 mmol/L (ref 98–111)
Creatinine, Ser: 1.57 mg/dL — ABNORMAL HIGH (ref 0.44–1.00)
GFR, Estimated: 33 mL/min — ABNORMAL LOW (ref >60)
Glucose, Bld: 108 mg/dL — ABNORMAL HIGH (ref 70–99)
Potassium: 4.1 mmol/L (ref 3.5–5.1)
Sodium: 134 mmol/L — ABNORMAL LOW (ref 135–145)
Total Bilirubin: 1 mg/dL (ref 0.3–1.2)
Total Protein: 6.9 g/dL (ref 6.5–8.1)

## 2022-05-24 LAB — PREPARE RBC (CROSSMATCH)

## 2022-05-24 LAB — IRON AND TIBC
Iron: 165 ug/dL (ref 28–170)
Saturation Ratios: 49 % — ABNORMAL HIGH (ref 10.4–31.8)
TIBC: 335 ug/dL (ref 250–450)
UIBC: 170 ug/dL

## 2022-05-24 LAB — ABO/RH: ABO/RH(D): O POS

## 2022-05-24 LAB — FERRITIN: Ferritin: 196 ng/mL (ref 11–307)

## 2022-05-24 LAB — LACTATE DEHYDROGENASE: LDH: 242 U/L — ABNORMAL HIGH (ref 98–192)

## 2022-05-24 NOTE — Progress Notes (Signed)
Patient here for oncology follow-up appointment,  concerns of sinus/ allergies

## 2022-05-24 NOTE — Progress Notes (Signed)
[place] NOTE  Patient Care Team: Derinda Late, MD as PCP - General (Family Medicine) Rockey Situ Kathlene November, MD as PCP - Cardiology (Cardiology) Minna Merritts, MD as Consulting Physician (Cardiology) Cammie Sickle, MD as Consulting Physician (Internal Medicine)  CHIEF COMPLAINTS/PURPOSE OF CONSULTATION: ET  Oncology History Overview Note  # ESSENTIAL THROMBOCYTOSIS [name] 2019- 684; July 2020- platelets-992 ; Hb-12 white count- 9];INTERMEDIATE RISK;July 2020-; CALR MUTATION POSITIVE; July 2020- Hydrea 500 mg/day.   # A.fib on xarelto [A.fib]  # Chronic Kidney disease-III   Essential thrombocytosis ([place])  01/30/2019 Initial Diagnosis   Essential thrombocytosis ([place])       HISTORY OF PRESENTING ILLNESS:  Paula Clark 81 y.o.  female pleasant patient with essential thrombocytosis currently on Hydrea is here for follow-up.  Patient reports she had recurrent Baker's cyst in her left knee which took few weeks to resolve.  Denies diarrhea.  Recently she has been feeling nauseous and lousy.  Review of Systems  Constitutional:  Positive for malaise/fatigue. Negative for chills, diaphoresis, fever and weight loss.  HENT:  Negative for nosebleeds and sore throat.   Eyes:  Negative for double vision.  Respiratory:  Negative for cough, hemoptysis, sputum production, shortness of breath and wheezing.   Cardiovascular:  Negative for chest pain, palpitations, orthopnea and leg swelling.  Gastrointestinal:  Negative for abdominal pain, blood in stool, constipation, heartburn, melena, nausea and vomiting.  Genitourinary:  Negative for dysuria, frequency and urgency.  Musculoskeletal:  Positive for joint pain. Negative for back pain.  Skin: Negative.  Negative for itching and rash.  Neurological:  Negative for dizziness, tingling, focal weakness, weakness and headaches.  Endo/Heme/Allergies:  Does not bruise/bleed easily.  Psychiatric/Behavioral:  Negative  for depression. The patient is not nervous/anxious and does not have insomnia.      MEDICAL HISTORY:  Past Medical History:  Diagnosis Date   Chronic atrial fibrillation ([place])    a. Unsuccessful DCCV 05/2016; b. CHADS2VASc => 4 (HTN, age x 2, female)--> Xarelto.   CKD (chronic kidney disease), stage III (HCC)    Epistaxis    Gout    History of stress test    a. 04/2016 MV: EF 80%, no ischemia.   HLD (hyperlipidemia)    Hypertension    Mitral regurgitation    a. Echo 04/2016: EF 60-65%, no RWMA, mild AI, mild MR, mild biatrial enlargement, PASP 32 mmHg, trivial pericardial effusion noted posterior to the heart   Syncope     SURGICAL HISTORY: Past Surgical History:  Procedure Laterality Date   ABDOMINAL HYSTERECTOMY     APPENDECTOMY     ELECTROPHYSIOLOGIC STUDY N/A 05/18/2016   Procedure: CARDIOVERSION;  Surgeon: Minna Merritts, MD;  Location: [facility] ORS;  Service: Cardiovascular;  Laterality: N/A;   OOPHORECTOMY     TONSILLECTOMY     TOTAL VAGINAL HYSTERECTOMY      SOCIAL HISTORY: Social History   Socioeconomic History   Marital status: Widowed    Spouse name: Not on file   Number of children: Not on file   Years of education: Not on file   Highest education level: Not on file  Occupational History   Not on file  Tobacco Use   Smoking status: Former   Smokeless tobacco: Never  Vaping Use   Vaping Use: Never used  Substance and Sexual Activity   Alcohol use: No   Drug use: No   Sexual activity: Not on file  Other Topics Concern   Not on  file  Social History Narrative   [name]; self; remote smoking; No alcohol; worked in [employer] retd.    Social Determinants of Health   Financial Resource Strain: Not on file  Food Insecurity: Not on file  Transportation Needs: Not on file  Physical Activity: Not on file  Stress: Not on file  Social Connections: Not on file  Intimate Partner Violence: Not on file    FAMILY HISTORY: Family History  Problem  Relation Age of Onset   Heart failure Mother    Atrial fibrillation Brother     ALLERGIES:  is allergic to codeine.  MEDICATIONS:  Current Outpatient Medications  Medication Sig Dispense Refill   acetaminophen (TYLENOL) 500 MG tablet Take 500-1,000 mg by mouth every 6 (six) hours as needed for moderate pain or headache.     cetirizine (ZYRTEC) 10 MG tablet Take 10 mg by mouth daily.     Cholecalciferol (VITAMIN D3) 1000 units CAPS Take 1,000 Units by mouth daily.     Cholecalciferol-Vitamin C (VITAMIN D3-VITAMIN C) 1000-500 UNIT-MG CAPS Take 1,000 Units by mouth.     colchicine 0.6 MG tablet      diltiazem (CARDIZEM CD) 240 MG 24 hr capsule TAKE 1 CAPSULE BY MOUTH EVERY DAY 30 capsule 1   doxazosin (CARDURA) 8 MG tablet Take 8 mg by mouth daily.     hydroxyurea (HYDREA) 500 MG capsule Take 1 capsule (500 mg total) by mouth 2 (two) times daily. 180 capsule 1   loperamide (IMODIUM) 2 MG capsule Take 2 mg by mouth as needed for diarrhea or loose stools.     losartan (COZAAR) 25 MG tablet Take 25 mg by mouth daily.     ondansetron (ZOFRAN) 4 MG tablet Take 1 tablet (4 mg total) by mouth every 8 (eight) hours as needed for nausea or vomiting. 20 tablet 0   XARELTO 15 MG TABS tablet TAKE 1 TABLET (15 MG TOTAL) BY MOUTH DAILY WITH SUPPER 90 tablet 1   furosemide (LASIX) 20 MG tablet Take 1 tablet (20 mg total) by mouth daily as needed. (Patient not taking: Reported on 05/24/2022) 30 tablet 3   No current facility-administered medications for this visit.     PHYSICAL EXAMINATION:   Vitals:   05/24/22 1514  BP: (!) 140/47  Pulse: 74  Resp: 18  Temp: 98.6 F (37 C)  SpO2: 95%   Filed Weights   05/24/22 1514  Weight: 144 lb (65.3 kg)    Physical Exam HENT:     Head: Normocephalic and atraumatic.     Mouth/Throat:     Pharynx: No oropharyngeal exudate.  Eyes:     Pupils: Pupils are equal, round, and reactive to light.  Cardiovascular:     Rate and Rhythm: Normal rate and  regular rhythm.  Pulmonary:     Effort: Pulmonary effort is normal. No respiratory distress.     Breath sounds: Normal breath sounds. No wheezing.  Abdominal:     General: Bowel sounds are normal. There is no distension.     Palpations: Abdomen is soft. There is no mass.     Tenderness: There is no abdominal tenderness. There is no guarding or rebound.  Musculoskeletal:        General: No tenderness. Normal range of motion.     Cervical back: Normal range of motion and neck supple.  Skin:    General: Skin is warm.  Neurological:     Mental Status: She is alert and oriented to person, place,  and time.  Psychiatric:        Mood and Affect: Affect normal.      LABORATORY DATA:  I have reviewed the data as listed Lab Results  Component Value Date   WBC 1.5 (L) 05/24/2022   HGB 6.4 (L) 05/24/2022   HCT 19.2 (L) 05/24/2022   MCV 118.5 (H) 05/24/2022   PLT 140 (L) 05/24/2022   Recent Labs    11/20/21 1402 03/23/22 1427 05/24/22 1456  NA 137 133* 134*  K 4.5 4.2 4.1  CL 104 104 103  CO2 '23 26 23  '$ GLUCOSE 138* 113* 108*  BUN 24* 20 30*  CREATININE 1.27* 1.41* 1.57*  CALCIUM 9.0 9.0 8.8*  GFRNONAA 42* 37* 33*  PROT 7.3 7.2 6.9  ALBUMIN 3.8 3.8 3.8  AST '21 20 19  '$ ALT '13 13 11  '$ ALKPHOS 74 64 61  BILITOT 0.8 0.6 1.0      No results found.  ASSESSMENT & PLAN:   Essential thrombocytosis (HCC) #Essential thrombocytosis-CALR positive; Intermediate risk given her age;  During last visit about 2 months ago, due to rising thrombocytosis her hydroxyurea dose was increased from 500 mg daily to twice daily. CBC with differential today showed pancytopenia with WBC 1.5, ANC 0.6, hemoglobin 6.5 and platelet 138.  Advised the patient to stop hydroxyurea completely.  She was type and screen and will come back tomorrow morning for 1 unit of PRBC.  I will follow-up with her again in 1 week with repeat labs to assess for recovery.  This was discussed in detail with both the patient and  her son over the phone.   #Pancytopenia-likely secondary to hydroxyurea.  Management as above.   Iron panel was also added which was normal.  #CKD stage III-[GFR-42s]; on diuretic- -STABLE.   #Diarrhea-grade 1 Hydrea- STABLE.    # A.fib- on xarelto; -STABLE .   # DISPOSITION:  #RTC in 1 week with me and labs. Tomorrow for 1 unit prbc    Jane Canary, MD 05/24/2022 8:48 PM

## 2022-05-25 ENCOUNTER — Inpatient Hospital Stay: Payer: Medicare HMO

## 2022-05-25 DIAGNOSIS — D638 Anemia in other chronic diseases classified elsewhere: Secondary | ICD-10-CM

## 2022-05-25 DIAGNOSIS — R197 Diarrhea, unspecified: Secondary | ICD-10-CM | POA: Diagnosis not present

## 2022-05-25 DIAGNOSIS — N183 Chronic kidney disease, stage 3 unspecified: Secondary | ICD-10-CM | POA: Diagnosis not present

## 2022-05-25 DIAGNOSIS — Z90710 Acquired absence of both cervix and uterus: Secondary | ICD-10-CM | POA: Diagnosis not present

## 2022-05-25 DIAGNOSIS — D75839 Thrombocytosis, unspecified: Secondary | ICD-10-CM | POA: Diagnosis not present

## 2022-05-25 DIAGNOSIS — D61818 Other pancytopenia: Secondary | ICD-10-CM | POA: Diagnosis not present

## 2022-05-25 DIAGNOSIS — I482 Chronic atrial fibrillation, unspecified: Secondary | ICD-10-CM | POA: Diagnosis not present

## 2022-05-25 DIAGNOSIS — D473 Essential (hemorrhagic) thrombocythemia: Secondary | ICD-10-CM

## 2022-05-25 DIAGNOSIS — I129 Hypertensive chronic kidney disease with stage 1 through stage 4 chronic kidney disease, or unspecified chronic kidney disease: Secondary | ICD-10-CM | POA: Diagnosis not present

## 2022-05-25 DIAGNOSIS — Z7901 Long term (current) use of anticoagulants: Secondary | ICD-10-CM | POA: Diagnosis not present

## 2022-05-25 MED ORDER — SODIUM CHLORIDE 0.9% IV SOLUTION
250.0000 mL | Freq: Once | INTRAVENOUS | Status: AC
  Administered 2022-05-25: 250 mL via INTRAVENOUS
  Filled 2022-05-25: qty 250

## 2022-05-25 NOTE — Patient Instructions (Signed)

## 2022-05-26 LAB — TYPE AND SCREEN
ABO/RH(D): O POS
Antibody Screen: NEGATIVE
Unit division: 0

## 2022-05-26 LAB — BPAM RBC
Blood Product Expiration Date: 202312192359
ISSUE DATE / TIME: 202311140823
Unit Type and Rh: 5100

## 2022-05-30 ENCOUNTER — Other Ambulatory Visit: Payer: Self-pay | Admitting: Cardiovascular Disease

## 2022-05-31 ENCOUNTER — Encounter: Payer: Self-pay | Admitting: Internal Medicine

## 2022-05-31 ENCOUNTER — Inpatient Hospital Stay: Payer: Medicare HMO

## 2022-05-31 ENCOUNTER — Inpatient Hospital Stay (HOSPITAL_BASED_OUTPATIENT_CLINIC_OR_DEPARTMENT_OTHER): Payer: Medicare HMO | Admitting: Internal Medicine

## 2022-05-31 VITALS — BP 171/58 | HR 66 | Temp 96.7°F | Resp 19 | Wt 145.0 lb

## 2022-05-31 DIAGNOSIS — D61811 Other drug-induced pancytopenia: Secondary | ICD-10-CM | POA: Diagnosis not present

## 2022-05-31 DIAGNOSIS — Z7901 Long term (current) use of anticoagulants: Secondary | ICD-10-CM | POA: Diagnosis not present

## 2022-05-31 DIAGNOSIS — I129 Hypertensive chronic kidney disease with stage 1 through stage 4 chronic kidney disease, or unspecified chronic kidney disease: Secondary | ICD-10-CM | POA: Diagnosis not present

## 2022-05-31 DIAGNOSIS — D473 Essential (hemorrhagic) thrombocythemia: Secondary | ICD-10-CM

## 2022-05-31 DIAGNOSIS — N183 Chronic kidney disease, stage 3 unspecified: Secondary | ICD-10-CM | POA: Diagnosis not present

## 2022-05-31 DIAGNOSIS — D638 Anemia in other chronic diseases classified elsewhere: Secondary | ICD-10-CM

## 2022-05-31 DIAGNOSIS — Z90710 Acquired absence of both cervix and uterus: Secondary | ICD-10-CM | POA: Diagnosis not present

## 2022-05-31 DIAGNOSIS — D75839 Thrombocytosis, unspecified: Secondary | ICD-10-CM | POA: Diagnosis not present

## 2022-05-31 DIAGNOSIS — I482 Chronic atrial fibrillation, unspecified: Secondary | ICD-10-CM | POA: Diagnosis not present

## 2022-05-31 DIAGNOSIS — R197 Diarrhea, unspecified: Secondary | ICD-10-CM | POA: Diagnosis not present

## 2022-05-31 DIAGNOSIS — D61818 Other pancytopenia: Secondary | ICD-10-CM | POA: Diagnosis not present

## 2022-05-31 LAB — CBC WITH DIFFERENTIAL/PLATELET
Abs Immature Granulocytes: 0.02 10*3/uL (ref 0.00–0.07)
Basophils Absolute: 0 10*3/uL (ref 0.0–0.1)
Basophils Relative: 0 %
Eosinophils Absolute: 0 10*3/uL (ref 0.0–0.5)
Eosinophils Relative: 1 %
HCT: 22.9 % — ABNORMAL LOW (ref 36.0–46.0)
Hemoglobin: 7.6 g/dL — ABNORMAL LOW (ref 12.0–15.0)
Immature Granulocytes: 1 %
Lymphocytes Relative: 23 %
Lymphs Abs: 0.6 10*3/uL — ABNORMAL LOW (ref 0.7–4.0)
MCH: 37.4 pg — ABNORMAL HIGH (ref 26.0–34.0)
MCHC: 33.2 g/dL (ref 30.0–36.0)
MCV: 112.8 fL — ABNORMAL HIGH (ref 80.0–100.0)
Monocytes Absolute: 0.6 10*3/uL (ref 0.1–1.0)
Monocytes Relative: 20 %
Neutro Abs: 1.5 10*3/uL — ABNORMAL LOW (ref 1.7–7.7)
Neutrophils Relative %: 55 %
Platelets: 194 10*3/uL (ref 150–400)
RBC: 2.03 MIL/uL — ABNORMAL LOW (ref 3.87–5.11)
RDW: 21.9 % — ABNORMAL HIGH (ref 11.5–15.5)
WBC: 2.7 10*3/uL — ABNORMAL LOW (ref 4.0–10.5)
nRBC: 0 % (ref 0.0–0.2)

## 2022-05-31 NOTE — Progress Notes (Signed)
[facility] NOTE  Patient Care Team: Derinda Late, MD as PCP - General (Family Medicine) Rockey Situ Kathlene November, MD as PCP - Cardiology (Cardiology) Minna Merritts, MD as Consulting Physician (Cardiology) Cammie Sickle, MD as Consulting Physician (Internal Medicine)  CHIEF COMPLAINTS/PURPOSE OF CONSULTATION: ET  Oncology History Overview Note  # ESSENTIAL THROMBOCYTOSIS Paula Clark 2019- 684; July 2020- platelets-992 ; Hb-12 white count- 9];INTERMEDIATE RISK;July 2020-; CALR MUTATION POSITIVE; July 2020- Hydrea 500 mg/day.   # A.fib on xarelto [A.fib]  # Chronic Kidney disease-III   Essential thrombocytosis ([place])  01/30/2019 Initial Diagnosis   Essential thrombocytosis ([place])       HISTORY OF PRESENTING ILLNESS:  Paula Clark 81 y.o.  female pleasant patient with essential thrombocytosis currently on Hydrea is here for follow-up.  Patient reports improvement in her energy levels.  She had 1 unit of PRBC last week and tolerated well.  Review of Systems  Constitutional:  Positive for malaise/fatigue. Negative for chills, diaphoresis, fever and weight loss.  HENT:  Negative for nosebleeds and sore throat.   Eyes:  Negative for double vision.  Respiratory:  Negative for cough, hemoptysis, sputum production, shortness of breath and wheezing.   Cardiovascular:  Negative for chest pain, palpitations, orthopnea and leg swelling.  Gastrointestinal:  Negative for abdominal pain, blood in stool, constipation, heartburn, melena, nausea and vomiting.  Genitourinary:  Negative for dysuria, frequency and urgency.  Musculoskeletal:  Positive for joint pain. Negative for back pain.  Skin: Negative.  Negative for itching and rash.  Neurological:  Negative for dizziness, tingling, focal weakness, weakness and headaches.  Endo/Heme/Allergies:  Does not bruise/bleed easily.  Psychiatric/Behavioral:  Negative for depression. The patient is not nervous/anxious and does  not have insomnia.      MEDICAL HISTORY:  Past Medical History:  Diagnosis Date   Chronic atrial fibrillation ([place])    a. Unsuccessful DCCV 05/2016; b. CHADS2VASc => 4 (HTN, age x 2, female)--> Xarelto.   CKD (chronic kidney disease), stage III (HCC)    Epistaxis    Gout    History of stress test    a. 04/2016 MV: EF 80%, no ischemia.   HLD (hyperlipidemia)    Hypertension    Mitral regurgitation    a. Echo 04/2016: EF 60-65%, no RWMA, mild AI, mild MR, mild biatrial enlargement, PASP 32 mmHg, trivial pericardial effusion noted posterior to the heart   Syncope     SURGICAL HISTORY: Past Surgical History:  Procedure Laterality Date   ABDOMINAL HYSTERECTOMY     APPENDECTOMY     ELECTROPHYSIOLOGIC STUDY N/A 05/18/2016   Procedure: CARDIOVERSION;  Surgeon: Minna Merritts, MD;  Location: [facility] ORS;  Service: Cardiovascular;  Laterality: N/A;   OOPHORECTOMY     TONSILLECTOMY     TOTAL VAGINAL HYSTERECTOMY      SOCIAL HISTORY: Social History   Socioeconomic History   Marital status: Widowed    Spouse name: Not on file   Number of children: Not on file   Years of education: Not on file   Highest education level: Not on file  Occupational History   Not on file  Tobacco Use   Smoking status: Former   Smokeless tobacco: Never  Vaping Use   Vaping Use: Never used  Substance and Sexual Activity   Alcohol use: No   Drug use: No   Sexual activity: Not on file  Other Topics Concern   Not on file  Social History Narrative   Paula Clark; self; remote  smoking; No alcohol; worked in [employer] retd.    Social Determinants of Health   Financial Resource Strain: Not on file  Food Insecurity: Not on file  Transportation Needs: Not on file  Physical Activity: Not on file  Stress: Not on file  Social Connections: Not on file  Intimate Partner Violence: Not on file    FAMILY HISTORY: Family History  Problem Relation Age of Onset   Heart failure Mother    Atrial  fibrillation Brother     ALLERGIES:  is allergic to codeine.  MEDICATIONS:  Current Outpatient Medications  Medication Sig Dispense Refill   acetaminophen (TYLENOL) 500 MG tablet Take 500-1,000 mg by mouth every 6 (six) hours as needed for moderate pain or headache.     cetirizine (ZYRTEC) 10 MG tablet Take 10 mg by mouth daily.     Cholecalciferol (VITAMIN D3) 1000 units CAPS Take 1,000 Units by mouth daily.     Cholecalciferol-Vitamin C (VITAMIN D3-VITAMIN C) 1000-500 UNIT-MG CAPS Take 1,000 Units by mouth.     colchicine 0.6 MG tablet      diltiazem (CARDIZEM CD) 240 MG 24 hr capsule TAKE 1 CAPSULE BY MOUTH EVERY DAY 30 capsule 1   doxazosin (CARDURA) 8 MG tablet Take 8 mg by mouth daily.     hydroxyurea (HYDREA) 500 MG capsule Take 1 capsule (500 mg total) by mouth 2 (two) times daily. 180 capsule 1   loperamide (IMODIUM) 2 MG capsule Take 2 mg by mouth as needed for diarrhea or loose stools.     losartan (COZAAR) 25 MG tablet Take 25 mg by mouth daily.     ondansetron (ZOFRAN) 4 MG tablet Take 1 tablet (4 mg total) by mouth every 8 (eight) hours as needed for nausea or vomiting. 20 tablet 0   XARELTO 15 MG TABS tablet TAKE 1 TABLET (15 MG TOTAL) BY MOUTH DAILY WITH SUPPER 90 tablet 1   furosemide (LASIX) 20 MG tablet Take 1 tablet (20 mg total) by mouth daily as needed. (Patient not taking: Reported on 05/24/2022) 30 tablet 3   No current facility-administered medications for this visit.     PHYSICAL EXAMINATION:   Vitals:   05/31/22 1319  BP: (!) 171/58  Pulse: 66  Resp: 19  Temp: (!) 96.7 F (35.9 C)  SpO2: 97%   Filed Weights   05/31/22 1319  Weight: 145 lb (65.8 kg)    Physical Exam HENT:     Head: Normocephalic and atraumatic.     Mouth/Throat:     Pharynx: No oropharyngeal exudate.  Eyes:     Pupils: Pupils are equal, round, and reactive to light.  Cardiovascular:     Rate and Rhythm: Normal rate and regular rhythm.  Pulmonary:     Effort: Pulmonary  effort is normal. No respiratory distress.     Breath sounds: Normal breath sounds. No wheezing.  Abdominal:     General: Bowel sounds are normal. There is no distension.     Palpations: Abdomen is soft. There is no mass.     Tenderness: There is no abdominal tenderness. There is no guarding or rebound.  Musculoskeletal:        General: No tenderness. Normal range of motion.     Cervical back: Normal range of motion and neck supple.  Skin:    General: Skin is warm.  Neurological:     Mental Status: She is alert and oriented to person, place, and time.  Psychiatric:  Mood and Affect: Affect normal.      LABORATORY DATA:  I have reviewed the data as listed Lab Results  Component Value Date   WBC 2.7 (L) 05/31/2022   HGB 7.6 (L) 05/31/2022   HCT 22.9 (L) 05/31/2022   MCV 112.8 (H) 05/31/2022   PLT 194 05/31/2022   Recent Labs    11/20/21 1402 03/23/22 1427 05/24/22 1456  NA 137 133* 134*  K 4.5 4.2 4.1  CL 104 104 103  CO2 '23 26 23  '$ GLUCOSE 138* 113* 108*  BUN 24* 20 30*  CREATININE 1.27* 1.41* 1.57*  CALCIUM 9.0 9.0 8.8*  GFRNONAA 42* 37* 33*  PROT 7.3 7.2 6.9  ALBUMIN 3.8 3.8 3.8  AST '21 20 19  '$ ALT '13 13 11  '$ ALKPHOS 74 64 61  BILITOT 0.8 0.6 1.0      No results found.  ASSESSMENT & PLAN:   Essential thrombocytosis (HCC) #Essential thrombocytosis-CALR positive; Intermediate risk given her age;  During last visit about 2 months ago, due to rising thrombocytosis her hydroxyurea dose was increased from 500 mg daily to twice daily. Patient developed hydroxyurea induced pancytopenia.  Hydroxyurea was held on 05/24/2022.  She also received 1 unit PRBC for hemoglobin of 6.8.  CBC with differential reviewed today and her counts are improving.  I will continue to hold hydroxyurea for another week.  She will see Dr. B in 1 week with CBC with diff.    #CKD stage III-[GFR-42s]; on diuretic- -STABLE.   #Diarrhea-grade 1 Hydrea- STABLE.    # A.fib- on xarelto;  -STABLE .   # DISPOSITION:  #RTC in 1 week with Dr. B and labs.    Jane Canary, MD 05/31/2022 2:09 PM

## 2022-06-08 ENCOUNTER — Encounter: Payer: Self-pay | Admitting: Internal Medicine

## 2022-06-08 ENCOUNTER — Inpatient Hospital Stay: Payer: Medicare HMO

## 2022-06-08 ENCOUNTER — Inpatient Hospital Stay (HOSPITAL_BASED_OUTPATIENT_CLINIC_OR_DEPARTMENT_OTHER): Payer: Medicare HMO | Admitting: Internal Medicine

## 2022-06-08 VITALS — BP 174/69 | HR 75 | Temp 96.6°F | Resp 19 | Wt 143.8 lb

## 2022-06-08 DIAGNOSIS — R197 Diarrhea, unspecified: Secondary | ICD-10-CM | POA: Diagnosis not present

## 2022-06-08 DIAGNOSIS — Z90710 Acquired absence of both cervix and uterus: Secondary | ICD-10-CM | POA: Diagnosis not present

## 2022-06-08 DIAGNOSIS — I482 Chronic atrial fibrillation, unspecified: Secondary | ICD-10-CM | POA: Diagnosis not present

## 2022-06-08 DIAGNOSIS — D61811 Other drug-induced pancytopenia: Secondary | ICD-10-CM

## 2022-06-08 DIAGNOSIS — D61818 Other pancytopenia: Secondary | ICD-10-CM | POA: Diagnosis not present

## 2022-06-08 DIAGNOSIS — D473 Essential (hemorrhagic) thrombocythemia: Secondary | ICD-10-CM | POA: Diagnosis not present

## 2022-06-08 DIAGNOSIS — D75839 Thrombocytosis, unspecified: Secondary | ICD-10-CM | POA: Diagnosis not present

## 2022-06-08 DIAGNOSIS — I129 Hypertensive chronic kidney disease with stage 1 through stage 4 chronic kidney disease, or unspecified chronic kidney disease: Secondary | ICD-10-CM | POA: Diagnosis not present

## 2022-06-08 DIAGNOSIS — Z7901 Long term (current) use of anticoagulants: Secondary | ICD-10-CM | POA: Diagnosis not present

## 2022-06-08 DIAGNOSIS — N183 Chronic kidney disease, stage 3 unspecified: Secondary | ICD-10-CM | POA: Diagnosis not present

## 2022-06-08 LAB — CBC WITH DIFFERENTIAL/PLATELET
Abs Immature Granulocytes: 0.15 10*3/uL — ABNORMAL HIGH (ref 0.00–0.07)
Basophils Absolute: 0 10*3/uL (ref 0.0–0.1)
Basophils Relative: 1 %
Eosinophils Absolute: 0 10*3/uL (ref 0.0–0.5)
Eosinophils Relative: 1 %
HCT: 23.7 % — ABNORMAL LOW (ref 36.0–46.0)
Hemoglobin: 7.7 g/dL — ABNORMAL LOW (ref 12.0–15.0)
Immature Granulocytes: 4 %
Lymphocytes Relative: 22 %
Lymphs Abs: 0.8 10*3/uL (ref 0.7–4.0)
MCH: 36.7 pg — ABNORMAL HIGH (ref 26.0–34.0)
MCHC: 32.5 g/dL (ref 30.0–36.0)
MCV: 112.9 fL — ABNORMAL HIGH (ref 80.0–100.0)
Monocytes Absolute: 0.6 10*3/uL (ref 0.1–1.0)
Monocytes Relative: 15 %
Neutro Abs: 2.2 10*3/uL (ref 1.7–7.7)
Neutrophils Relative %: 57 %
Platelets: 579 10*3/uL — ABNORMAL HIGH (ref 150–400)
RBC: 2.1 MIL/uL — ABNORMAL LOW (ref 3.87–5.11)
RDW: 21.8 % — ABNORMAL HIGH (ref 11.5–15.5)
WBC: 3.9 10*3/uL — ABNORMAL LOW (ref 4.0–10.5)
nRBC: 0 % (ref 0.0–0.2)

## 2022-06-08 NOTE — Progress Notes (Signed)
Patient states she was wondering if MD could help with her GOUT.

## 2022-06-08 NOTE — Assessment & Plan Note (Addendum)
#  Essential thrombocytosis-CALR positive; Intermediate risk given her age;-most recently on Hydrea 500 mg  BID.  Currently on hold because of recent pancytopenia for the last 3 weeks.  # Given the severe pancytopenia needing blood transfusion-recommend HOLDING Hydrea at this time- see below.  Anemia slowly improving hemoglobin 7.6.  Nadir 6.4.  Platelets elevated at 579-monitor for now.  ANC-normal; improved.   # #CKD stage III-[GFR-42s]; on diuretic- -STABLE.  # A.fib- on xarelto; no concerns blood loss. -STABLE;   # DISPOSITION:  # follow up in 1 month-MD-labs; cbc/cmp/LDH; HOLD tube; Reticulocyte count; DAT - Dr.B

## 2022-06-08 NOTE — Progress Notes (Signed)
[facility] NOTE  Patient Care Team: Derinda Late, MD as PCP - General (Family Medicine) Rockey Situ Kathlene November, MD as PCP - Cardiology (Cardiology) Minna Merritts, MD as Consulting Physician (Cardiology) Cammie Sickle, MD as Consulting Physician (Internal Medicine)  CHIEF COMPLAINTS/PURPOSE OF CONSULTATION: ET  Oncology History Overview Note  # ESSENTIAL THROMBOCYTOSIS Paula Clark 2019- 684; July 2020- platelets-992 ; Hb-12 white count- 9];INTERMEDIATE RISK;July 2020-; CALR MUTATION POSITIVE; July 2020- Hydrea 500 mg/day.   # A.fib on xarelto [A.fib]  # Chronic Kidney disease-III   Essential thrombocytosis ([place])  01/30/2019 Initial Diagnosis   Essential thrombocytosis (HCC)     HISTORY OF PRESENTING ILLNESS: Alone. Ambulating independently.   Paula Clark 81 y.o.  female pleasant patient with essential thrombocytosis most recently on Hydrea is here for follow-up.  In the interim patient was evaluated by Dr.Agrawal-regarding progressive fatigue.  Noted to have pancytopenia hemoglobin 6.5.  Suspected sec to Hydrea.  Patient s/p PRBC transfusion.  Patient is currently off Hydrea.  Patient denies any blood in stools or black or stools.  Denies any skin ulcerations.  No rash.  No history of strokes or blood clots.  Review of Systems  Constitutional:  Positive for malaise/fatigue. Negative for chills, diaphoresis, fever and weight loss.  HENT:  Negative for nosebleeds and sore throat.   Eyes:  Negative for double vision.  Respiratory:  Negative for cough, hemoptysis, sputum production, shortness of breath and wheezing.   Cardiovascular:  Negative for chest pain, palpitations, orthopnea and leg swelling.  Gastrointestinal:  Positive for diarrhea. Negative for abdominal pain, blood in stool, constipation, heartburn, melena, nausea and vomiting.  Genitourinary:  Negative for dysuria, frequency and urgency.  Musculoskeletal:  Positive for joint pain. Negative  for back pain.  Skin: Negative.  Negative for itching and rash.  Neurological:  Negative for dizziness, tingling, focal weakness, weakness and headaches.  Endo/Heme/Allergies:  Does not bruise/bleed easily.  Psychiatric/Behavioral:  Negative for depression. The patient is not nervous/anxious and does not have insomnia.      MEDICAL HISTORY:  Past Medical History:  Diagnosis Date   Chronic atrial fibrillation ([place])    a. Unsuccessful DCCV 05/2016; b. CHADS2VASc => 4 (HTN, age x 2, female)--> Xarelto.   CKD (chronic kidney disease), stage III (HCC)    Epistaxis    Gout    History of stress test    a. 04/2016 MV: EF 80%, no ischemia.   HLD (hyperlipidemia)    Hypertension    Mitral regurgitation    a. Echo 04/2016: EF 60-65%, no RWMA, mild AI, mild MR, mild biatrial enlargement, PASP 32 mmHg, trivial pericardial effusion noted posterior to the heart   Syncope     SURGICAL HISTORY: Past Surgical History:  Procedure Laterality Date   ABDOMINAL HYSTERECTOMY     APPENDECTOMY     ELECTROPHYSIOLOGIC STUDY N/A 05/18/2016   Procedure: CARDIOVERSION;  Surgeon: Minna Merritts, MD;  Location: [facility] ORS;  Service: Cardiovascular;  Laterality: N/A;   OOPHORECTOMY     TONSILLECTOMY     TOTAL VAGINAL HYSTERECTOMY      SOCIAL HISTORY: Social History   Socioeconomic History   Marital status: Widowed    Spouse name: Not on file   Number of children: Not on file   Years of education: Not on file   Highest education level: Not on file  Occupational History   Not on file  Tobacco Use   Smoking status: Former   Smokeless tobacco: Never  Vaping  Use   Vaping Use: Never used  Substance and Sexual Activity   Alcohol use: No   Drug use: No   Sexual activity: Not on file  Other Topics Concern   Not on file  Social History Narrative   [name]; self; remote smoking; No alcohol; worked in [employer] retd.    Social Determinants of Health   Financial Resource Strain: Not on file   Food Insecurity: Not on file  Transportation Needs: Not on file  Physical Activity: Not on file  Stress: Not on file  Social Connections: Not on file  Intimate Partner Violence: Not on file    FAMILY HISTORY: Family History  Problem Relation Age of Onset   Heart failure Mother    Atrial fibrillation Brother     ALLERGIES:  is allergic to codeine.  MEDICATIONS:  Current Outpatient Medications  Medication Sig Dispense Refill   acetaminophen (TYLENOL) 500 MG tablet Take 500-1,000 mg by mouth every 6 (six) hours as needed for moderate pain or headache.     cetirizine (ZYRTEC) 10 MG tablet Take 10 mg by mouth daily.     Cholecalciferol (VITAMIN D3) 1000 units CAPS Take 1,000 Units by mouth daily.     Cholecalciferol-Vitamin C (VITAMIN D3-VITAMIN C) 1000-500 UNIT-MG CAPS Take 1,000 Units by mouth.     colchicine 0.6 MG tablet      diltiazem (CARDIZEM CD) 240 MG 24 hr capsule TAKE 1 CAPSULE BY MOUTH EVERY DAY 30 capsule 1   doxazosin (CARDURA) 8 MG tablet Take 8 mg by mouth daily.     hydroxyurea (HYDREA) 500 MG capsule Take 1 capsule (500 mg total) by mouth 2 (two) times daily. 180 capsule 1   loperamide (IMODIUM) 2 MG capsule Take 2 mg by mouth as needed for diarrhea or loose stools.     losartan (COZAAR) 25 MG tablet Take 25 mg by mouth daily.     ondansetron (ZOFRAN) 4 MG tablet Take 1 tablet (4 mg total) by mouth every 8 (eight) hours as needed for nausea or vomiting. 20 tablet 0   XARELTO 15 MG TABS tablet TAKE 1 TABLET (15 MG TOTAL) BY MOUTH DAILY WITH SUPPER 90 tablet 1   furosemide (LASIX) 20 MG tablet Take 1 tablet (20 mg total) by mouth daily as needed. (Patient not taking: Reported on 05/24/2022) 30 tablet 3   No current facility-administered medications for this visit.     PHYSICAL EXAMINATION:   Vitals:   06/08/22 1508  BP: (!) 174/69  Pulse: 75  Resp: 19  Temp: (!) 96.6 F (35.9 C)  SpO2: 98%   Filed Weights   06/08/22 1508  Weight: 143 lb 12.8 oz (65.2  kg)    Physical Exam HENT:     Head: Normocephalic and atraumatic.     Mouth/Throat:     Pharynx: No oropharyngeal exudate.  Eyes:     Pupils: Pupils are equal, round, and reactive to light.  Cardiovascular:     Rate and Rhythm: Normal rate and regular rhythm.  Pulmonary:     Effort: Pulmonary effort is normal. No respiratory distress.     Breath sounds: Normal breath sounds. No wheezing.  Abdominal:     General: Bowel sounds are normal. There is no distension.     Palpations: Abdomen is soft. There is no mass.     Tenderness: There is no abdominal tenderness. There is no guarding or rebound.  Musculoskeletal:        General: No tenderness. Normal range  of motion.     Cervical back: Normal range of motion and neck supple.  Skin:    General: Skin is warm.  Neurological:     Mental Status: She is alert and oriented to person, place, and time.  Psychiatric:        Mood and Affect: Affect normal.    LABORATORY DATA:  I have reviewed the data as listed Lab Results  Component Value Date   WBC 3.9 (L) 06/08/2022   HGB 7.7 (L) 06/08/2022   HCT 23.7 (L) 06/08/2022   MCV 112.9 (H) 06/08/2022   PLT 579 (H) 06/08/2022   Recent Labs    11/20/21 1402 03/23/22 1427 05/24/22 1456  NA 137 133* 134*  K 4.5 4.2 4.1  CL 104 104 103  CO2 '23 26 23  '$ GLUCOSE 138* 113* 108*  BUN 24* 20 30*  CREATININE 1.27* 1.41* 1.57*  CALCIUM 9.0 9.0 8.8*  GFRNONAA 42* 37* 33*  PROT 7.3 7.2 6.9  ALBUMIN 3.8 3.8 3.8  AST '21 20 19  '$ ALT '13 13 11  '$ ALKPHOS 74 64 61  BILITOT 0.8 0.6 1.0     No results found.  ASSESSMENT & PLAN:   Essential thrombocytosis (HCC) #Essential thrombocytosis-CALR positive; Intermediate risk given her age;-most recently on Hydrea 500 mg  BID.  Currently on hold because of recent pancytopenia for the last 3 weeks.  # Given the severe pancytopenia needing blood transfusion-recommend HOLDING Hydrea at this time- see below.  Anemia slowly improving hemoglobin 7.6.   Nadir 6.4.  Platelets elevated at 500+-monitor for now.  ANC-normal; improved.   # #CKD stage III-[GFR-42s]; on diuretic- -STABLE.  # A.fib- on xarelto; no concerns blood loss. -STABLE;   # DISPOSITION:  # follow up in 1 month-MD-labs; cbc/cmp/LDH; HOLD tube; Reticulocyte count; DAT - Dr.B    Cammie Sickle, MD 06/08/2022 3:35 PM

## 2022-06-08 NOTE — Addendum Note (Signed)
Addended by: Harvel Ricks A on: 06/08/2022 03:37 PM   Modules accepted: Orders

## 2022-06-14 NOTE — Progress Notes (Unsigned)
Cardiology Office Note  Date:  06/15/2022   ID:  Paula Clark, DOB [birth year], MRN [id number]  PCP:  Derinda Late, MD   Chief Complaint  Patient presents with   6 month follow up     Patient c/o a cough for the past couple of days with some chest tightness when coughing. Patient was taking chemo for bone marrow cancer but has stopped due to decrease in platelet count.  Medications reviewed by the patient verbally.     HPI:  81 year old woman With history of  hypertension,  persistent atrial fibrillation,  CHADSVASc score of at least 4 syncope in the setting of dehydration,  Stress test 04/30/2016 no ischemia Attempted cardioversion for atrial fibrillation 05/18/2016, failed  severe epistaxis right nostril 04/07/2016 emergency room 04/11/2016 with epistaxis x4,  seen by ENT in the emergency room Admitted to the hospital 04/12/2016 Referred to Dr. Burt Knack for watchman device CRI Gout Prior chemo for bone marrow cancer/high platelets Who presents for follow-up of her chronic atrial fibrillation  LOV April 2023 Feels tired , SOB "My tail has been dragging" Cough past 2 weeks Worsening leg swelling, not taking Lasix  Lab work reviewed Hemoglobin 7.7 up from 6.5 in Nov 2023  down from 10.7 in sept 2023 Recently received blood transfusion May 26, 2022 On Hydrea for bone marrow cancer History of thrombocytopenia Now off hydrea,  plts 579  Echocardiogram May 2023 ejection fraction 60 to 65%, right heart pressures 43 mmHg, moderately dilated left atrium Ascending aorta 4 cm  On xarelto 15 mg daily Creatinine clearance 30  Labs reviewed CR 1.5 Not on lasix regularly  Blood pressure elevated today, not monitoring blood pressure at home Remains on Cardura 8 mg in the morning, diltiazem 240  EKG personally reviewed by myself on todays visit Atrial fib rate 71 bpm  Other past medical history reviewed evaluated by Dr. Burt Knack November 2017 for watchman device  Dr.  Burt Knack was going to discuss case with ENT physician prior to scheduling TEE There was some concern that she would not tolerate aspirin and Plavix TEE was never performed    Noted to be in atrial fibrillation in the emergency room, no prior EKGs available but patient responded at that time that she had atrial fibrillation before but was not on anticoagulation  Admitted back to the hospital 04/16/2016 with fall, weakness, dehydration, creatinine 1.6 (improved with IV hydration overnight), discharge on 04/18/2016  She was seen by our office, ryan Dunn 04/21/2016, scheduled for cardioversion Cardioversion attempted 05/18/2016 unsuccessful.  suspect  possibly chronic   PMH:   has a past medical history of Chronic atrial fibrillation ([place]), CKD (chronic kidney disease), stage III ([place]), Epistaxis, Gout, History of stress test, HLD (hyperlipidemia), Hypertension, Mitral regurgitation, and Syncope.  PSH:    Past Surgical History:  Procedure Laterality Date   ABDOMINAL HYSTERECTOMY     APPENDECTOMY     ELECTROPHYSIOLOGIC STUDY N/A 05/18/2016   Procedure: CARDIOVERSION;  Surgeon: Minna Merritts, MD;  Location: [facility] ORS;  Service: Cardiovascular;  Laterality: N/A;   OOPHORECTOMY     TONSILLECTOMY     TOTAL VAGINAL HYSTERECTOMY      Current Outpatient Medications  Medication Sig Dispense Refill   acetaminophen (TYLENOL) 500 MG tablet Take 500-1,000 mg by mouth every 6 (six) hours as needed for moderate pain or headache.     cetirizine (ZYRTEC) 10 MG tablet Take 10 mg by mouth daily.     Cholecalciferol (VITAMIN D3) 1000 units CAPS Take 1,000  Units by mouth daily.     colchicine 0.6 MG tablet      diltiazem (CARDIZEM CD) 240 MG 24 hr capsule TAKE 1 CAPSULE BY MOUTH EVERY DAY 30 capsule 1   hydroxyurea (HYDREA) 500 MG capsule Take 1 capsule (500 mg total) by mouth 2 (two) times daily. 180 capsule 1   loperamide (IMODIUM) 2 MG capsule Take 2 mg by mouth as needed for diarrhea or loose stools.      losartan (COZAAR) 25 MG tablet Take 25 mg by mouth daily.     ondansetron (ZOFRAN) 4 MG tablet Take 1 tablet (4 mg total) by mouth every 8 (eight) hours as needed for nausea or vomiting. 20 tablet 0   potassium chloride (KLOR-CON) 10 MEQ tablet Take 1 tablet by mouth M,W,F, and Sat with furosemide 16 tablet 5   XARELTO 15 MG TABS tablet TAKE 1 TABLET (15 MG TOTAL) BY MOUTH DAILY WITH SUPPER 90 tablet 1   doxazosin (CARDURA) 8 MG tablet Take 1 tablet in the AM and 1/2 tab (4 mg) in the evening 45 tablet 5   furosemide (LASIX) 20 MG tablet Take 1 tablet by mouth M,W,F, and Sat 16 tablet 1   No current facility-administered medications for this visit.    Allergies:   Codeine   Social History:  The patient  reports that she has quit smoking. She has never used smokeless tobacco. She reports that she does not drink alcohol and does not use drugs.   Family History:   family history includes Atrial fibrillation in her brother; Heart failure in her mother.    Review of Systems: Review of Systems  Constitutional:  Positive for malaise/fatigue.  HENT: Negative.    Respiratory:  Positive for cough and shortness of breath.   Cardiovascular:  Positive for leg swelling.  Gastrointestinal: Negative.   Musculoskeletal: Negative.   Neurological: Negative.   Psychiatric/Behavioral: Negative.    All other systems reviewed and are negative.   PHYSICAL EXAM: VS:  BP (!) 168/58 (BP Location: Left Arm, Patient Position: Sitting, Cuff Size: Normal)   Pulse 71   Ht 5' 2.5" (1.588 m)   Wt 146 lb 4 oz (66.3 kg)   SpO2 96%   BMI 26.32 kg/m  , BMI Body mass index is 26.32 kg/m. Constitutional:  oriented to person, place, and time. No distress.  HENT:  Head: Grossly normal Eyes:  no discharge. No scleral icterus.  Neck: No JVD, no carotid bruits  Cardiovascular: Irregularly irregular no murmurs appreciated Trace to 1+ pitting bilateral lower extremity edema Pulmonary/Chest: Clear to auscultation  bilaterally, no wheezes or rales Abdominal: Soft.  no distension.  no tenderness.  Musculoskeletal: Normal range of motion Neurological:  normal muscle tone. Coordination normal. No atrophy Skin: Skin warm and dry Psychiatric: normal affect, pleasant  Recent Labs: 05/24/2022: ALT 11; BUN 30; Creatinine, Ser 1.57; Potassium 4.1; Sodium 134 06/08/2022: Hemoglobin 7.7; Platelets 579    Lipid Panel No results found for: "CHOL", "HDL", "LDLCALC", "TRIG"    Wt Readings from Last 3 Encounters:  06/15/22 146 lb 4 oz (66.3 kg)  06/08/22 143 lb 12.8 oz (65.2 kg)  05/31/22 145 lb (65.8 kg)      ASSESSMENT AND PLAN:  Permanent atrial fibrillation ([place]) - Plan: EKG 12-Lead  tolerating xarelto 15 mg daily, no bleeding Rate well controlled on diltiazem  Diastolic CHF Currently not taking Lasix but having symptoms concerning for PND/orthopnea with lower extremity edema Recommend she start Lasix 20 mg every other  day with potassium 10 every other day  Essential hypertension -  Blood pressure running high on today's visit 136 systolic on my check Continues to take Cardizem 240 morning, also takes doxazosin 8 mg in the morning Recommend she add additional doxazosin 4 mg in the evening and monitor blood pressure at home and call us with numbers  Epistaxis Prior history, none recently Tolerating anticoagulation, xarelto 15 mg daily given low creatinine clearance less than 50 Has anemia, no signs of GI bleeding  Weakness Suggest walking program  Murmur Echocardiogram 11/2021 Sclerosis of aortic valve noted   Total encounter time more than 30 minutes  Greater than 50% was spent in counseling and coordination of care with the patient   Orders Placed This Encounter  Procedures   EKG 12-Lead     Signed, Esmond Plants, M.D., Ph.D. 06/15/2022  [place], [place]

## 2022-06-15 ENCOUNTER — Ambulatory Visit: Payer: Medicare HMO | Attending: Cardiovascular Disease | Admitting: Cardiovascular Disease

## 2022-06-15 ENCOUNTER — Encounter: Payer: Self-pay | Admitting: Cardiovascular Disease

## 2022-06-15 VITALS — BP 168/58 | HR 71 | Ht 62.5 in | Wt 146.2 lb

## 2022-06-15 DIAGNOSIS — R531 Weakness: Secondary | ICD-10-CM

## 2022-06-15 DIAGNOSIS — R0602 Shortness of breath: Secondary | ICD-10-CM

## 2022-06-15 DIAGNOSIS — N183 Chronic kidney disease, stage 3 unspecified: Secondary | ICD-10-CM | POA: Diagnosis not present

## 2022-06-15 DIAGNOSIS — I10 Essential (primary) hypertension: Secondary | ICD-10-CM

## 2022-06-15 DIAGNOSIS — I4819 Other persistent atrial fibrillation: Secondary | ICD-10-CM

## 2022-06-15 MED ORDER — FUROSEMIDE 20 MG PO TABS: ORAL_TABLET | ORAL | 1 refills | Status: DC

## 2022-06-15 MED ORDER — POTASSIUM CHLORIDE ER 10 MEQ PO TBCR: EXTENDED_RELEASE_TABLET | ORAL | 5 refills | Status: DC

## 2022-06-15 MED ORDER — DOXAZOSIN MESYLATE 8 MG PO TABS: ORAL_TABLET | ORAL | 5 refills | Status: DC

## 2022-06-15 NOTE — Patient Instructions (Addendum)
Medication Instructions:  Please start lasix/furosemide every other day (M,W,F and Sat) Take with potassium 10 meq  Please increase the cardura up to 8 mg in the Am and 4 mg in the PM  If you need a refill on your cardiac medications before your next appointment, please call your pharmacy.   Lab work: No new labs needed  Testing/Procedures: No new testing needed  Follow-Up: At [facility], you and your health needs are our priority.  As part of our continuing mission to provide you with exceptional heart care, we have created designated Provider Care Teams.  These Care Teams include your primary Cardiologist (physician) and Advanced Practice Providers (APPs -  Physician Assistants and Nurse Practitioners) who all work together to provide you with the care you need, when you need it.  You will need a follow up appointment in 6 months  Providers on your designated Care Team:   Murray Hodgkins, NP Christell Faith, PA-C Cadence Kathlen Mody, [place]  COVID-19 Vaccine Information can be found at: [email or web address].uk For questions related to vaccine distribution or appointments, please email [email or web address] or call [phone].

## 2022-07-02 ENCOUNTER — Other Ambulatory Visit: Payer: Self-pay | Admitting: Cardiovascular Disease

## 2022-07-06 ENCOUNTER — Other Ambulatory Visit: Payer: Self-pay | Admitting: Cardiovascular Disease

## 2022-07-09 ENCOUNTER — Encounter: Payer: Self-pay | Admitting: Internal Medicine

## 2022-07-09 ENCOUNTER — Inpatient Hospital Stay (HOSPITAL_BASED_OUTPATIENT_CLINIC_OR_DEPARTMENT_OTHER): Payer: Medicare HMO | Admitting: Internal Medicine

## 2022-07-09 ENCOUNTER — Inpatient Hospital Stay: Payer: Medicare HMO | Attending: Internal Medicine

## 2022-07-09 VITALS — BP 138/51 | HR 60 | Temp 96.3°F | Resp 18 | Wt 135.7 lb

## 2022-07-09 DIAGNOSIS — D61818 Other pancytopenia: Secondary | ICD-10-CM | POA: Diagnosis not present

## 2022-07-09 DIAGNOSIS — N183 Chronic kidney disease, stage 3 unspecified: Secondary | ICD-10-CM | POA: Insufficient documentation

## 2022-07-09 DIAGNOSIS — M1711 Unilateral primary osteoarthritis, right knee: Secondary | ICD-10-CM | POA: Diagnosis not present

## 2022-07-09 DIAGNOSIS — D473 Essential (hemorrhagic) thrombocythemia: Secondary | ICD-10-CM | POA: Diagnosis not present

## 2022-07-09 DIAGNOSIS — I129 Hypertensive chronic kidney disease with stage 1 through stage 4 chronic kidney disease, or unspecified chronic kidney disease: Secondary | ICD-10-CM | POA: Diagnosis not present

## 2022-07-09 DIAGNOSIS — I482 Chronic atrial fibrillation, unspecified: Secondary | ICD-10-CM | POA: Insufficient documentation

## 2022-07-09 DIAGNOSIS — N170 Acute kidney failure with tubular necrosis: Secondary | ICD-10-CM

## 2022-07-09 DIAGNOSIS — Z90710 Acquired absence of both cervix and uterus: Secondary | ICD-10-CM | POA: Insufficient documentation

## 2022-07-09 DIAGNOSIS — D75839 Thrombocytosis, unspecified: Secondary | ICD-10-CM | POA: Diagnosis not present

## 2022-07-09 DIAGNOSIS — Z7901 Long term (current) use of anticoagulants: Secondary | ICD-10-CM | POA: Diagnosis not present

## 2022-07-09 LAB — COMPREHENSIVE METABOLIC PANEL
ALT: 17 U/L (ref 0–44)
AST: 24 U/L (ref 15–41)
Albumin: 3.9 g/dL (ref 3.5–5.0)
Alkaline Phosphatase: 75 U/L (ref 38–126)
Anion gap: 11 (ref 5–15)
BUN: 28 mg/dL — ABNORMAL HIGH (ref 8–23)
CO2: 24 mmol/L (ref 22–32)
Calcium: 9.4 mg/dL (ref 8.9–10.3)
Chloride: 102 mmol/L (ref 98–111)
Creatinine, Ser: 1.66 mg/dL — ABNORMAL HIGH (ref 0.44–1.00)
GFR, Estimated: 30 mL/min — ABNORMAL LOW (ref >60)
Glucose, Bld: 135 mg/dL — ABNORMAL HIGH (ref 70–99)
Potassium: 4.5 mmol/L (ref 3.5–5.1)
Sodium: 137 mmol/L (ref 135–145)
Total Bilirubin: 0.7 mg/dL (ref 0.3–1.2)
Total Protein: 7.5 g/dL (ref 6.5–8.1)

## 2022-07-09 LAB — RETICULOCYTES
Immature Retic Fract: 14 % (ref 2.3–15.9)
RBC.: 3.04 MIL/uL — ABNORMAL LOW (ref 3.87–5.11)
Retic Count, Absolute: 50.8 10*3/uL (ref 19.0–186.0)
Retic Ct Pct: 1.7 % (ref 0.4–3.1)

## 2022-07-09 LAB — CBC WITH DIFFERENTIAL/PLATELET
Abs Immature Granulocytes: 0.18 10*3/uL — ABNORMAL HIGH (ref 0.00–0.07)
Basophils Absolute: 0.1 10*3/uL (ref 0.0–0.1)
Basophils Relative: 1 %
Eosinophils Absolute: 0.2 10*3/uL (ref 0.0–0.5)
Eosinophils Relative: 2 %
HCT: 27.9 % — ABNORMAL LOW (ref 36.0–46.0)
Hemoglobin: 10.1 g/dL — ABNORMAL LOW (ref 12.0–15.0)
Immature Granulocytes: 2 %
Lymphocytes Relative: 19 %
Lymphs Abs: 1.6 10*3/uL (ref 0.7–4.0)
MCH: 37.8 pg — ABNORMAL HIGH (ref 26.0–34.0)
MCHC: 36.2 g/dL — ABNORMAL HIGH (ref 30.0–36.0)
MCV: 104.5 fL — ABNORMAL HIGH (ref 80.0–100.0)
Monocytes Absolute: 1.4 10*3/uL — ABNORMAL HIGH (ref 0.1–1.0)
Monocytes Relative: 16 %
Neutro Abs: 5 10*3/uL (ref 1.7–7.7)
Neutrophils Relative %: 60 %
Platelets: 1229 10*3/uL (ref 150–400)
RBC: 2.67 MIL/uL — ABNORMAL LOW (ref 3.87–5.11)
RDW: 17.8 % — ABNORMAL HIGH (ref 11.5–15.5)
WBC: 8.4 10*3/uL (ref 4.0–10.5)
nRBC: 0 % (ref 0.0–0.2)

## 2022-07-09 LAB — PATHOLOGIST SMEAR REVIEW

## 2022-07-09 LAB — SAMPLE TO BLOOD BANK

## 2022-07-09 LAB — DAT, POLYSPECIFIC AHG (ARMC ONLY): Polyspecific AHG test: NEGATIVE

## 2022-07-09 LAB — LACTATE DEHYDROGENASE: LDH: 276 U/L — ABNORMAL HIGH (ref 98–192)

## 2022-07-09 NOTE — Progress Notes (Signed)
Pt here for follow up. She reports that she has been having to right knee for about 2 weeks.

## 2022-07-09 NOTE — Assessment & Plan Note (Addendum)
#  Essential thrombocytosis-CALR positive; Intermediate risk given her age-Hydrea currently on hold because of severe pancytopenia anemia/ ?  Worsening renal insufficiency.  # Today platelets are greater than 1200; hemoglobin 10 white count normal at 8.4.  Recommend re-starting hydrea 500 mg one day.  Again low clinical suspicion for acute leukemic process.  Await review of peripheral smear.  # Right knee pain/swelling- ? Arthritis- defer to PCP.  Recheck uric acid next visit.  # CKD stage III-[GFR-]- on diuretic- slowly worse at GFR 30- defer to PCP/card [Dr.Gollan]; recommend US kidneys for further evaluation.   # A.fib- on xarelto 15 mg/day [CKD; Dr.Gollan]; no concerns blood loss. -STABLE;   Staff Will call re: Kidney US # DISPOSITION:  # kidney US ASAP # follow up in 1 month-MD-labs; cbc/cmp/LDH;uric acid - Dr.B

## 2022-07-09 NOTE — Progress Notes (Signed)
Paula NOTE  Patient Care Team: Derinda Late, Paula as PCP - General (Family Medicine) Rockey Situ Kathlene November, Paula as PCP - Cardiology (Cardiology) Minna Merritts, Paula as Consulting Physician (Cardiology) Cammie Sickle, Paula as Consulting Physician (Internal Medicine)  CHIEF COMPLAINTS/PURPOSE OF CONSULTATION: ET  Oncology History Overview Note  # ESSENTIAL THROMBOCYTOSIS Paula Clark 2019- 684; July 2020- platelets-992 ; Hb-12 white count- 9];INTERMEDIATE RISK;July 2020-; CALR MUTATION POSITIVE; July 2020- Hydrea 500 mg/day.   # A.fib on xarelto [A.fib]  # Chronic Kidney disease-III   Essential thrombocytosis ([place])  01/30/2019 Initial Diagnosis   Essential thrombocytosis (HCC)    HISTORY OF PRESENTING ILLNESS: Alone. Ambulating iwith a walker [right knee arthitis]  Paula Clark 81 y.o.  female pleasant patient with essential thrombocytosis most recently on Hydrea is here for follow-up. Currently off hydrea sec to sever pancytopenia.   Complains of worsening right knee pain/swelling; walking with a rolling walker.   Patient denies any blood in stools or black or stools.  Denies any skin ulcerations.  No rash.  No history of strokes or blood clots.  Review of Systems  Constitutional:  Positive for malaise/fatigue. Negative for chills, diaphoresis, fever and weight loss.  HENT:  Negative for nosebleeds and sore throat.   Eyes:  Negative for double vision.  Respiratory:  Negative for cough, hemoptysis, sputum production, shortness of breath and wheezing.   Cardiovascular:  Negative for chest pain, palpitations, orthopnea and leg swelling.  Gastrointestinal:  Positive for diarrhea. Negative for abdominal pain, blood in stool, constipation, heartburn, melena, nausea and vomiting.  Genitourinary:  Negative for dysuria, frequency and urgency.  Musculoskeletal:  Positive for joint pain. Negative for back pain.  Skin: Negative.  Negative for itching and rash.   Neurological:  Negative for dizziness, tingling, focal weakness, weakness and headaches.  Endo/Heme/Allergies:  Does not bruise/bleed easily.  Psychiatric/Behavioral:  Negative for depression. The patient is not nervous/anxious and does not have insomnia.      MEDICAL HISTORY:  Past Medical History:  Diagnosis Date   Chronic atrial fibrillation ([place])    a. Unsuccessful DCCV 05/2016; b. CHADS2VASc => 4 (HTN, age x 2, female)--> Xarelto.   CKD (chronic kidney disease), stage III (HCC)    Epistaxis    Gout    History of stress test    a. 04/2016 MV: EF 80%, no ischemia.   HLD (hyperlipidemia)    Hypertension    Mitral regurgitation    a. Echo 04/2016: EF 60-65%, no RWMA, mild AI, mild MR, mild biatrial enlargement, PASP 32 mmHg, trivial pericardial effusion noted posterior to the heart   Syncope     SURGICAL HISTORY: Past Surgical History:  Procedure Laterality Date   ABDOMINAL HYSTERECTOMY     APPENDECTOMY     ELECTROPHYSIOLOGIC STUDY N/A 05/18/2016   Procedure: CARDIOVERSION;  Surgeon: Minna Merritts, Paula;  Location: [facility] ORS;  Service: Cardiovascular;  Laterality: N/A;   OOPHORECTOMY     TONSILLECTOMY     TOTAL VAGINAL HYSTERECTOMY      SOCIAL HISTORY: Social History   Socioeconomic History   Marital status: Widowed    Spouse name: Not on file   Number of children: Not on file   Years of education: Not on file   Highest education level: Not on file  Occupational History   Not on file  Tobacco Use   Smoking status: Former   Smokeless tobacco: Never  Vaping Use   Vaping Use: Never used  Substance and  Sexual Activity   Alcohol use: No   Drug use: No   Sexual activity: Not on file  Other Topics Concern   Not on file  Social History Narrative   [name]; self; remote smoking; No alcohol; worked in [place] [employer] retd.    Social Determinants of Health   Financial Resource Strain: Not on file  Food Insecurity: Not on file  Transportation Needs: Not on  file  Physical Activity: Not on file  Stress: Not on file  Social Connections: Not on file  Intimate Partner Violence: Not on file    FAMILY HISTORY: Family History  Problem Relation Age of Onset   Heart failure Mother    Atrial fibrillation Brother     ALLERGIES:  is allergic to codeine.  MEDICATIONS:  Current Outpatient Medications  Medication Sig Dispense Refill   acetaminophen (TYLENOL) 500 MG tablet Take 500-1,000 mg by mouth every 6 (six) hours as needed for moderate pain or headache.     cetirizine (ZYRTEC) 10 MG tablet Take 10 mg by mouth daily.     Cholecalciferol (VITAMIN D3) 1000 units CAPS Take 1,000 Units by mouth daily.     colchicine 0.6 MG tablet      diltiazem (CARDIZEM CD) 240 MG 24 hr capsule TAKE 1 CAPSULE BY MOUTH EVERY DAY 30 capsule 5   doxazosin (CARDURA) 8 MG tablet Take 1 tablet in the AM and 1/2 tab (4 mg) in the evening 45 tablet 5   furosemide (LASIX) 20 MG tablet TAKE 1 TABLET BY MOUTH MON,WED,FRI, AND SAT 48 tablet 1   hydroxyurea (HYDREA) 500 MG capsule Take 1 capsule (500 mg total) by mouth 2 (two) times daily. 180 capsule 1   loperamide (IMODIUM) 2 MG capsule Take 2 mg by mouth as needed for diarrhea or loose stools.     losartan (COZAAR) 25 MG tablet Take 25 mg by mouth daily.     ondansetron (ZOFRAN) 4 MG tablet Take 1 tablet (4 mg total) by mouth every 8 (eight) hours as needed for nausea or vomiting. 20 tablet 0   potassium chloride (KLOR-CON) 10 MEQ tablet Take 1 tablet by mouth M,W,F, and Sat with furosemide 16 tablet 5   XARELTO 15 MG TABS tablet TAKE 1 TABLET (15 MG TOTAL) BY MOUTH DAILY WITH SUPPER 90 tablet 1   No current facility-administered medications for this visit.     PHYSICAL EXAMINATION:   Vitals:   07/09/22 1513  BP: (!) 138/51  Pulse: 60  Resp: 18  Temp: (!) 96.3 F (35.7 C)   Filed Weights   07/09/22 1513  Weight: 135 lb 11.2 oz (61.6 kg)    Physical Exam HENT:     Head: Normocephalic and atraumatic.      Mouth/Throat:     Pharynx: No oropharyngeal exudate.  Eyes:     Pupils: Pupils are equal, round, and reactive to light.  Cardiovascular:     Rate and Rhythm: Normal rate and regular rhythm.  Pulmonary:     Effort: Pulmonary effort is normal. No respiratory distress.     Breath sounds: Normal breath sounds. No wheezing.  Abdominal:     General: Bowel sounds are normal. There is no distension.     Palpations: Abdomen is soft. There is no mass.     Tenderness: There is no abdominal tenderness. There is no guarding or rebound.  Musculoskeletal:        General: No tenderness. Normal range of motion.     Cervical back: Normal  range of motion and neck supple.  Skin:    General: Skin is warm.  Neurological:     Mental Status: She is alert and oriented to person, place, and time.  Psychiatric:        Mood and Affect: Affect normal.    LABORATORY DATA:  I have reviewed the data as listed Lab Results  Component Value Date   WBC 8.4 07/09/2022   HGB 10.1 (L) 07/09/2022   HCT 27.9 (L) 07/09/2022   MCV 104.5 (H) 07/09/2022   PLT 1,229 (HH) 07/09/2022   Recent Labs    03/23/22 1427 05/24/22 1456 07/09/22 1447  NA 133* 134* 137  K 4.2 4.1 4.5  CL 104 103 102  CO2 '26 23 24  '$ GLUCOSE 113* 108* 135*  BUN 20 30* 28*  CREATININE 1.41* 1.57* 1.66*  CALCIUM 9.0 8.8* 9.4  GFRNONAA 37* 33* 30*  PROT 7.2 6.9 7.5  ALBUMIN 3.8 3.8 3.9  AST '20 19 24  '$ ALT '13 11 17  '$ ALKPHOS 64 61 75  BILITOT 0.6 1.0 0.7     No results found.  ASSESSMENT & PLAN:   Essential thrombocytosis (HCC) #Essential thrombocytosis-CALR positive; Intermediate risk given her age-Hydrea currently on hold because of severe pancytopenia anemia/ ?  Worsening renal insufficiency.  # Today platelets are greater than 1200; hemoglobin 10 white count normal at 8.4.  Recommend re-starting hydrea 500 mg one day.  Again low clinical suspicion for acute leukemic process.  Await review of peripheral smear.  # Right knee  pain/swelling- ? Arthritis- defer to PCP.  Recheck uric acid next visit.  # CKD stage III-[GFR-]- on diuretic- slowly worse at GFR 30- defer to PCP/card [Dr.Gollan]; recommend US kidneys for further evaluation.   # A.fib- on xarelto 15 mg/day [CKD; Dr.Gollan]; no concerns blood loss. -STABLE;   Staff Will call re: Kidney US # DISPOSITION:  # kidney US ASAP # follow up in 1 month-Paula-labs; cbc/cmp/LDH;uric acid - Dr.B    Cammie Sickle, Paula 07/09/2022 4:53 PM

## 2022-07-09 NOTE — Patient Instructions (Signed)
#   Restart Hydrea 500 mg once a day.

## 2022-07-21 ENCOUNTER — Ambulatory Visit
Admission: RE | Admit: 2022-07-21 | Discharge: 2022-07-21 | Disposition: A | Discharge status: Home / Self Care | Payer: Medicare HMO | Source: Ambulatory Visit | Attending: Internal Medicine | Admitting: Internal Medicine

## 2022-07-21 DIAGNOSIS — N19 Unspecified kidney failure: Secondary | ICD-10-CM | POA: Diagnosis not present

## 2022-07-21 DIAGNOSIS — N183 Chronic kidney disease, stage 3 unspecified: Secondary | ICD-10-CM | POA: Diagnosis not present

## 2022-07-21 DIAGNOSIS — N170 Acute kidney failure with tubular necrosis: Secondary | ICD-10-CM | POA: Diagnosis not present

## 2022-07-22 DIAGNOSIS — R739 Hyperglycemia, unspecified: Secondary | ICD-10-CM | POA: Diagnosis not present

## 2022-07-22 DIAGNOSIS — Z79899 Other long term (current) drug therapy: Secondary | ICD-10-CM | POA: Diagnosis not present

## 2022-07-22 DIAGNOSIS — N1832 Chronic kidney disease, stage 3b: Secondary | ICD-10-CM | POA: Diagnosis not present

## 2022-07-22 DIAGNOSIS — I10 Essential (primary) hypertension: Secondary | ICD-10-CM | POA: Diagnosis not present

## 2022-07-29 DIAGNOSIS — Z79899 Other long term (current) drug therapy: Secondary | ICD-10-CM | POA: Diagnosis not present

## 2022-07-29 DIAGNOSIS — N189 Chronic kidney disease, unspecified: Secondary | ICD-10-CM | POA: Diagnosis not present

## 2022-07-29 DIAGNOSIS — D75839 Thrombocytosis, unspecified: Secondary | ICD-10-CM | POA: Diagnosis not present

## 2022-07-29 DIAGNOSIS — I480 Paroxysmal atrial fibrillation: Secondary | ICD-10-CM | POA: Diagnosis not present

## 2022-07-29 DIAGNOSIS — I129 Hypertensive chronic kidney disease with stage 1 through stage 4 chronic kidney disease, or unspecified chronic kidney disease: Secondary | ICD-10-CM | POA: Diagnosis not present

## 2022-08-09 DIAGNOSIS — D2262 Melanocytic nevi of left upper limb, including shoulder: Secondary | ICD-10-CM | POA: Diagnosis not present

## 2022-08-09 DIAGNOSIS — Z85820 Personal history of malignant melanoma of skin: Secondary | ICD-10-CM | POA: Diagnosis not present

## 2022-08-09 DIAGNOSIS — D2272 Melanocytic nevi of left lower limb, including hip: Secondary | ICD-10-CM | POA: Diagnosis not present

## 2022-08-09 DIAGNOSIS — D2261 Melanocytic nevi of right upper limb, including shoulder: Secondary | ICD-10-CM | POA: Diagnosis not present

## 2022-08-09 DIAGNOSIS — D225 Melanocytic nevi of trunk: Secondary | ICD-10-CM | POA: Diagnosis not present

## 2022-08-09 DIAGNOSIS — Z85828 Personal history of other malignant neoplasm of skin: Secondary | ICD-10-CM | POA: Diagnosis not present

## 2022-08-13 ENCOUNTER — Inpatient Hospital Stay (HOSPITAL_BASED_OUTPATIENT_CLINIC_OR_DEPARTMENT_OTHER): Payer: Medicare HMO | Admitting: Internal Medicine

## 2022-08-13 ENCOUNTER — Encounter: Payer: Self-pay | Admitting: Internal Medicine

## 2022-08-13 ENCOUNTER — Inpatient Hospital Stay: Payer: Medicare HMO | Attending: Internal Medicine

## 2022-08-13 VITALS — BP 142/70 | HR 83 | Temp 98.4°F | Wt 134.0 lb

## 2022-08-13 DIAGNOSIS — N183 Chronic kidney disease, stage 3 unspecified: Secondary | ICD-10-CM | POA: Diagnosis not present

## 2022-08-13 DIAGNOSIS — Z90710 Acquired absence of both cervix and uterus: Secondary | ICD-10-CM | POA: Diagnosis not present

## 2022-08-13 DIAGNOSIS — N170 Acute kidney failure with tubular necrosis: Secondary | ICD-10-CM

## 2022-08-13 DIAGNOSIS — I129 Hypertensive chronic kidney disease with stage 1 through stage 4 chronic kidney disease, or unspecified chronic kidney disease: Secondary | ICD-10-CM | POA: Insufficient documentation

## 2022-08-13 DIAGNOSIS — Z87891 Personal history of nicotine dependence: Secondary | ICD-10-CM | POA: Insufficient documentation

## 2022-08-13 DIAGNOSIS — Z7901 Long term (current) use of anticoagulants: Secondary | ICD-10-CM | POA: Diagnosis not present

## 2022-08-13 DIAGNOSIS — D75839 Thrombocytosis, unspecified: Secondary | ICD-10-CM | POA: Diagnosis not present

## 2022-08-13 DIAGNOSIS — Z90721 Acquired absence of ovaries, unilateral: Secondary | ICD-10-CM | POA: Diagnosis not present

## 2022-08-13 DIAGNOSIS — D473 Essential (hemorrhagic) thrombocythemia: Secondary | ICD-10-CM

## 2022-08-13 DIAGNOSIS — I482 Chronic atrial fibrillation, unspecified: Secondary | ICD-10-CM | POA: Diagnosis not present

## 2022-08-13 LAB — URIC ACID: Uric Acid, Serum: 11 mg/dL — ABNORMAL HIGH (ref 2.5–7.1)

## 2022-08-13 LAB — COMPREHENSIVE METABOLIC PANEL
ALT: 14 U/L (ref 0–44)
AST: 20 U/L (ref 15–41)
Albumin: 4 g/dL (ref 3.5–5.0)
Alkaline Phosphatase: 75 U/L (ref 38–126)
Anion gap: 10 (ref 5–15)
BUN: 32 mg/dL — ABNORMAL HIGH (ref 8–23)
CO2: 26 mmol/L (ref 22–32)
Calcium: 9.3 mg/dL (ref 8.9–10.3)
Chloride: 101 mmol/L (ref 98–111)
Creatinine, Ser: 1.86 mg/dL — ABNORMAL HIGH (ref 0.44–1.00)
GFR, Estimated: 27 mL/min — ABNORMAL LOW (ref >60)
Glucose, Bld: 111 mg/dL — ABNORMAL HIGH (ref 70–99)
Potassium: 4.3 mmol/L (ref 3.5–5.1)
Sodium: 137 mmol/L (ref 135–145)
Total Bilirubin: 0.2 mg/dL — ABNORMAL LOW (ref 0.3–1.2)
Total Protein: 7.5 g/dL (ref 6.5–8.1)

## 2022-08-13 LAB — CBC WITH DIFFERENTIAL/PLATELET
Abs Immature Granulocytes: 0.06 10*3/uL (ref 0.00–0.07)
Basophils Absolute: 0 10*3/uL (ref 0.0–0.1)
Basophils Relative: 1 %
Eosinophils Absolute: 0.1 10*3/uL (ref 0.0–0.5)
Eosinophils Relative: 1 %
HCT: 33.6 % — ABNORMAL LOW (ref 36.0–46.0)
Hemoglobin: 11.1 g/dL — ABNORMAL LOW (ref 12.0–15.0)
Immature Granulocytes: 1 %
Lymphocytes Relative: 20 %
Lymphs Abs: 1.2 10*3/uL (ref 0.7–4.0)
MCH: 33.1 pg (ref 26.0–34.0)
MCHC: 33 g/dL (ref 30.0–36.0)
MCV: 100.3 fL — ABNORMAL HIGH (ref 80.0–100.0)
Monocytes Absolute: 1.1 10*3/uL — ABNORMAL HIGH (ref 0.1–1.0)
Monocytes Relative: 19 %
Neutro Abs: 3.6 10*3/uL (ref 1.7–7.7)
Neutrophils Relative %: 58 %
Platelets: 507 10*3/uL — ABNORMAL HIGH (ref 150–400)
RBC: 3.35 MIL/uL — ABNORMAL LOW (ref 3.87–5.11)
RDW: 21.1 % — ABNORMAL HIGH (ref 11.5–15.5)
WBC: 6.1 10*3/uL (ref 4.0–10.5)
nRBC: 0 % (ref 0.0–0.2)

## 2022-08-13 LAB — LACTATE DEHYDROGENASE: LDH: 213 U/L — ABNORMAL HIGH (ref 98–192)

## 2022-08-13 NOTE — Progress Notes (Signed)
[name] NOTE  Patient Care Team: Derinda Late, MD as PCP - General (Family Medicine) Rockey Situ Kathlene November, MD as PCP - Cardiology (Cardiology) Minna Merritts, MD as Consulting Physician (Cardiology) Cammie Sickle, MD as Consulting Physician (Internal Medicine)  CHIEF COMPLAINTS/PURPOSE OF CONSULTATION: ET  Oncology History Overview Note  # ESSENTIAL THROMBOCYTOSIS Paula Clark 2019- 684; July 2020- platelets-992 ; Hb-12 white count- 9];INTERMEDIATE RISK;July 2020-; CALR MUTATION POSITIVE; July 2020- Hydrea 500 mg/day.   # A.fib on xarelto [A.fib]  # Chronic Kidney disease-III   Essential thrombocytosis ([place])  01/30/2019 Initial Diagnosis   Essential thrombocytosis (HCC)    HISTORY OF PRESENTING ILLNESS: Alone. Ambulating iwith a walker [right knee arthitis]  Paula Clark 82 y.o.  female pleasant patient with essential thrombocytosis most recently on Hydrea is here for follow-up/ review the results of the kidney US.   Appetite is fair. Pt feels aches and pains normal for her age. Tolerating 1 hydrea tab per day. She thinks her energy is normal.   Patient denies any blood in stools or black or stools.  Denies any skin ulcerations.  No rash.  No history of strokes or blood clots.  Review of Systems  Constitutional:  Positive for malaise/fatigue. Negative for chills, diaphoresis, fever and weight loss.  HENT:  Negative for nosebleeds and sore throat.   Eyes:  Negative for double vision.  Respiratory:  Negative for cough, hemoptysis, sputum production, shortness of breath and wheezing.   Cardiovascular:  Negative for chest pain, palpitations, orthopnea and leg swelling.  Gastrointestinal:  Positive for diarrhea. Negative for abdominal pain, blood in stool, constipation, heartburn, melena, nausea and vomiting.  Genitourinary:  Negative for dysuria, frequency and urgency.  Musculoskeletal:  Positive for joint pain. Negative for back pain.  Skin:  Negative.  Negative for itching and rash.  Neurological:  Negative for dizziness, tingling, focal weakness, weakness and headaches.  Endo/Heme/Allergies:  Does not bruise/bleed easily.  Psychiatric/Behavioral:  Negative for depression. The patient is not nervous/anxious and does not have insomnia.      MEDICAL HISTORY:  Past Medical History:  Diagnosis Date   Chronic atrial fibrillation ([place])    a. Unsuccessful DCCV 05/2016; b. CHADS2VASc => 4 (HTN, age x 2, female)--> Xarelto.   CKD (chronic kidney disease), stage III (HCC)    Epistaxis    Gout    History of stress test    a. 04/2016 MV: EF 80%, no ischemia.   HLD (hyperlipidemia)    Hypertension    Mitral regurgitation    a. Echo 04/2016: EF 60-65%, no RWMA, mild AI, mild MR, mild biatrial enlargement, PASP 32 mmHg, trivial pericardial effusion noted posterior to the heart   Syncope     SURGICAL HISTORY: Past Surgical History:  Procedure Laterality Date   ABDOMINAL HYSTERECTOMY     APPENDECTOMY     ELECTROPHYSIOLOGIC STUDY N/A 05/18/2016   Procedure: CARDIOVERSION;  Surgeon: Minna Merritts, MD;  Location: [facility] ORS;  Service: Cardiovascular;  Laterality: N/A;   OOPHORECTOMY     TONSILLECTOMY     TOTAL VAGINAL HYSTERECTOMY      SOCIAL HISTORY: Social History   Socioeconomic History   Marital status: Widowed    Spouse name: Not on file   Number of children: Not on file   Years of education: Not on file   Highest education level: Not on file  Occupational History   Not on file  Tobacco Use   Smoking status: Former   Smokeless tobacco:  Never  Vaping Use   Vaping Use: Never used  Substance and Sexual Activity   Alcohol use: No   Drug use: No   Sexual activity: Not on file  Other Topics Concern   Not on file  Social History Narrative   [name]; self; remote smoking; No alcohol; worked in [place] retd.    Social Determinants of Health   Financial Resource Strain: Not on file  Food Insecurity: Not  on file  Transportation Needs: Not on file  Physical Activity: Not on file  Stress: Not on file  Social Connections: Not on file  Intimate Partner Violence: Not on file    FAMILY HISTORY: Family History  Problem Relation Age of Onset   Heart failure Mother    Atrial fibrillation Brother     ALLERGIES:  is allergic to codeine.  MEDICATIONS:  Current Outpatient Medications  Medication Sig Dispense Refill   acetaminophen (TYLENOL) 500 MG tablet Take 500-1,000 mg by mouth every 6 (six) hours as needed for moderate pain or headache.     cetirizine (ZYRTEC) 10 MG tablet Take 10 mg by mouth daily.     Cholecalciferol (VITAMIN D3) 1000 units CAPS Take 1,000 Units by mouth daily.     diltiazem (CARDIZEM CD) 240 MG 24 hr capsule TAKE 1 CAPSULE BY MOUTH EVERY DAY 30 capsule 5   doxazosin (CARDURA) 8 MG tablet Take 1 tablet in the AM and 1/2 tab (4 mg) in the evening 45 tablet 5   furosemide (LASIX) 20 MG tablet TAKE 1 TABLET BY MOUTH MON,WED,FRI, AND SAT 48 tablet 1   hydroxyurea (HYDREA) 500 MG capsule Take 1 capsule (500 mg total) by mouth 2 (two) times daily. (Patient taking differently: Take 500 mg by mouth daily.) 180 capsule 1   losartan (COZAAR) 25 MG tablet Take 25 mg by mouth daily.     potassium chloride (KLOR-CON) 10 MEQ tablet Take 1 tablet by mouth M,W,F, and Sat with furosemide 16 tablet 5   XARELTO 15 MG TABS tablet TAKE 1 TABLET (15 MG TOTAL) BY MOUTH DAILY WITH SUPPER 90 tablet 1   colchicine 0.6 MG tablet  (Patient not taking: Reported on 08/13/2022)     loperamide (IMODIUM) 2 MG capsule Take 2 mg by mouth as needed for diarrhea or loose stools. (Patient not taking: Reported on 08/13/2022)     ondansetron (ZOFRAN) 4 MG tablet Take 1 tablet (4 mg total) by mouth every 8 (eight) hours as needed for nausea or vomiting. (Patient not taking: Reported on 08/13/2022) 20 tablet 0   No current facility-administered medications for this visit.     PHYSICAL EXAMINATION:   Vitals:    08/13/22 1502  BP: (!) 142/70  Pulse: 83  Temp: 98.4 F (36.9 C)  SpO2: 96%   Filed Weights   08/13/22 1502  Weight: 134 lb (60.8 kg)    Physical Exam HENT:     Head: Normocephalic and atraumatic.     Mouth/Throat:     Pharynx: No oropharyngeal exudate.  Eyes:     Pupils: Pupils are equal, round, and reactive to light.  Cardiovascular:     Rate and Rhythm: Normal rate and regular rhythm.  Pulmonary:     Effort: Pulmonary effort is normal. No respiratory distress.     Breath sounds: Normal breath sounds. No wheezing.  Abdominal:     General: Bowel sounds are normal. There is no distension.     Palpations: Abdomen is soft. There is no mass.  Tenderness: There is no abdominal tenderness. There is no guarding or rebound.  Musculoskeletal:        General: No tenderness. Normal range of motion.     Cervical back: Normal range of motion and neck supple.  Skin:    General: Skin is warm.  Neurological:     Mental Status: She is alert and oriented to person, place, and time.  Psychiatric:        Mood and Affect: Affect normal.    LABORATORY DATA:  I have reviewed the data as listed Lab Results  Component Value Date   WBC 6.1 08/13/2022   HGB 11.1 (L) 08/13/2022   HCT 33.6 (L) 08/13/2022   MCV 100.3 (H) 08/13/2022   PLT 507 (H) 08/13/2022   Recent Labs    05/24/22 1456 07/09/22 1447 08/13/22 1446  NA 134* 137 137  K 4.1 4.5 4.3  CL 103 102 101  CO2 '23 24 26  '$ GLUCOSE 108* 135* 111*  BUN 30* 28* 32*  CREATININE 1.57* 1.66* 1.86*  CALCIUM 8.8* 9.4 9.3  GFRNONAA 33* 30* 27*  PROT 6.9 7.5 7.5  ALBUMIN 3.8 3.9 4.0  AST '19 24 20  '$ ALT '11 17 14  '$ ALKPHOS 61 75 75  BILITOT 1.0 0.7 0.2*     US RENAL  Result Date: 07/22/2022 CLINICAL DATA:  Kidney failure. EXAM: RENAL / URINARY TRACT ULTRASOUND COMPLETE COMPARISON:  None Available. FINDINGS: Right Kidney: Renal measurements: 9.1 x 3.9 by 5.4 cm = volume: 100.48 mL. Increased cortical echogenicity. Left Kidney:  Renal measurements: 9.6 x 3.4 x 4.8 cm = volume: 81 mL. Contains a 4.3 cm cyst. No follow-up imaging recommended for the cyst. Diffuse increased cortical echogenicity. Bladder: Appears normal for degree of bladder distention. Other: None. IMPRESSION: Increased cortical echogenicity in both kidneys consistent with medical renal disease. No other significant abnormalities. Electronically Signed   By: Dorise Bullion III M.D.   On: 07/22/2022 14:11    ASSESSMENT & PLAN:   No problem-specific Assessment & Plan notes found for this encounter.    Cammie Sickle, MD 08/13/2022 3:21 PM

## 2022-08-13 NOTE — Assessment & Plan Note (Signed)
#  Essential thrombocytosis-CALR positive; Intermediate risk given her age- on Hydrea once a day.   Worsening renal insufficiency- see below.  # Today platelets 504 hemoglobin 11 white count normal at 8.4.  Recommend  continue hydrea 500 mg one day.    # CKD stage III-IV[GFR-]- on diuretic- slowly worse at GFR 27- defer to PCP/card [Dr.Gollan]; JAN 2024- US kidneys - negative for obstruction. Will refer to nephology.   # A.fib- on xarelto 15 mg/day [CKD; Dr.Gollan]; no concerns blood loss. -stable.    # DISPOSITION:  # refer to nephrology re: worsening renal failure.  # follow up in 3 month-MD-labs; cbc/cmp/LDH;MM panel; K/l light chains-  - Dr.B

## 2022-08-13 NOTE — Progress Notes (Signed)
Appetite is fair. Pt feels aches and pains normal for her age. Tolerating 1 hydrea tab per day. She thinks her energy is normal.

## 2022-11-12 ENCOUNTER — Encounter: Payer: Self-pay | Admitting: Internal Medicine

## 2022-11-12 ENCOUNTER — Inpatient Hospital Stay: Payer: Medicare HMO | Attending: Internal Medicine

## 2022-11-12 ENCOUNTER — Other Ambulatory Visit: Payer: Self-pay

## 2022-11-12 ENCOUNTER — Inpatient Hospital Stay: Payer: Medicare HMO | Admitting: Internal Medicine

## 2022-11-12 VITALS — BP 160/67 | HR 74 | Temp 96.3°F | Resp 18 | Ht 62.5 in | Wt 140.0 lb

## 2022-11-12 DIAGNOSIS — Z87891 Personal history of nicotine dependence: Secondary | ICD-10-CM | POA: Insufficient documentation

## 2022-11-12 DIAGNOSIS — D473 Essential (hemorrhagic) thrombocythemia: Secondary | ICD-10-CM

## 2022-11-12 DIAGNOSIS — M1711 Unilateral primary osteoarthritis, right knee: Secondary | ICD-10-CM | POA: Insufficient documentation

## 2022-11-12 DIAGNOSIS — Z90721 Acquired absence of ovaries, unilateral: Secondary | ICD-10-CM | POA: Insufficient documentation

## 2022-11-12 DIAGNOSIS — I129 Hypertensive chronic kidney disease with stage 1 through stage 4 chronic kidney disease, or unspecified chronic kidney disease: Secondary | ICD-10-CM | POA: Insufficient documentation

## 2022-11-12 DIAGNOSIS — Z7901 Long term (current) use of anticoagulants: Secondary | ICD-10-CM | POA: Diagnosis not present

## 2022-11-12 DIAGNOSIS — Z90710 Acquired absence of both cervix and uterus: Secondary | ICD-10-CM | POA: Insufficient documentation

## 2022-11-12 DIAGNOSIS — I4891 Unspecified atrial fibrillation: Secondary | ICD-10-CM | POA: Diagnosis not present

## 2022-11-12 DIAGNOSIS — D75839 Thrombocytosis, unspecified: Secondary | ICD-10-CM | POA: Diagnosis not present

## 2022-11-12 DIAGNOSIS — N184 Chronic kidney disease, stage 4 (severe): Secondary | ICD-10-CM | POA: Diagnosis not present

## 2022-11-12 DIAGNOSIS — D649 Anemia, unspecified: Secondary | ICD-10-CM | POA: Insufficient documentation

## 2022-11-12 LAB — COMPREHENSIVE METABOLIC PANEL
ALT: 12 U/L (ref 0–44)
AST: 21 U/L (ref 15–41)
Albumin: 3.7 g/dL (ref 3.5–5.0)
Alkaline Phosphatase: 73 U/L (ref 38–126)
Anion gap: 8 (ref 5–15)
BUN: 22 mg/dL (ref 8–23)
CO2: 26 mmol/L (ref 22–32)
Calcium: 8.9 mg/dL (ref 8.9–10.3)
Chloride: 103 mmol/L (ref 98–111)
Creatinine, Ser: 1.51 mg/dL — ABNORMAL HIGH (ref 0.44–1.00)
GFR, Estimated: 34 mL/min — ABNORMAL LOW (ref >60)
Glucose, Bld: 119 mg/dL — ABNORMAL HIGH (ref 70–99)
Potassium: 4 mmol/L (ref 3.5–5.1)
Sodium: 137 mmol/L (ref 135–145)
Total Bilirubin: 0.8 mg/dL (ref 0.3–1.2)
Total Protein: 7.2 g/dL (ref 6.5–8.1)

## 2022-11-12 LAB — CBC WITH DIFFERENTIAL/PLATELET
Abs Immature Granulocytes: 0.05 10*3/uL (ref 0.00–0.07)
Basophils Absolute: 0 10*3/uL (ref 0.0–0.1)
Basophils Relative: 1 %
Eosinophils Absolute: 0.1 10*3/uL (ref 0.0–0.5)
Eosinophils Relative: 2 %
HCT: 31.4 % — ABNORMAL LOW (ref 36.0–46.0)
Hemoglobin: 10.2 g/dL — ABNORMAL LOW (ref 12.0–15.0)
Immature Granulocytes: 1 %
Lymphocytes Relative: 21 %
Lymphs Abs: 1.2 10*3/uL (ref 0.7–4.0)
MCH: 35.8 pg — ABNORMAL HIGH (ref 26.0–34.0)
MCHC: 32.5 g/dL (ref 30.0–36.0)
MCV: 110.2 fL — ABNORMAL HIGH (ref 80.0–100.0)
Monocytes Absolute: 0.8 10*3/uL (ref 0.1–1.0)
Monocytes Relative: 14 %
Neutro Abs: 3.5 10*3/uL (ref 1.7–7.7)
Neutrophils Relative %: 61 %
Platelets: 507 10*3/uL — ABNORMAL HIGH (ref 150–400)
RBC: 2.85 MIL/uL — ABNORMAL LOW (ref 3.87–5.11)
RDW: 17.3 % — ABNORMAL HIGH (ref 11.5–15.5)
WBC: 5.8 10*3/uL (ref 4.0–10.5)
nRBC: 0 % (ref 0.0–0.2)

## 2022-11-12 LAB — IRON AND TIBC
Iron: 92 ug/dL (ref 28–170)
Saturation Ratios: 26 % (ref 10.4–31.8)
TIBC: 349 ug/dL (ref 250–450)
UIBC: 257 ug/dL

## 2022-11-12 LAB — FERRITIN: Ferritin: 101 ng/mL (ref 11–307)

## 2022-11-12 LAB — LACTATE DEHYDROGENASE: LDH: 232 U/L — ABNORMAL HIGH (ref 98–192)

## 2022-11-12 NOTE — Assessment & Plan Note (Signed)
#  Essential thrombocytosis-CALR positive; Intermediate risk given her age- on Hydrea once a day.   Worsening renal insufficiency- see below.  # Today platelets 507 hemoglobin 10.4 white count normal at 8.4.  Recommend  continue hydrea 500 mg one day.  Stable.   # CKD stage III-IV[GFR-]- on diuretic- slowly worse at GFR  34- defer to PCP/card [Dr.Gollan]; JAN 2024- US kidneys - negative for obstruction. No nephrology evaluation.   # Anemia/ ? Intermittent rectal bleeding- [>> colonoscopy- Dr.Andy Lamb]; Discussed re: repeating colonoscopy-   # A.fib- on xarelto 15 mg/day [CKD; Dr.Gollan]; no concerns blood loss. -stable.    # DISPOSITION:  # check iron studies; ferritin- add to labs today # follow up in 3 month-MD-labs; cbc/cmp/LDH-Dr.B

## 2022-11-12 NOTE — Progress Notes (Signed)
[facility] CONSULT NOTE  Patient Care Team: Paula Rud, MD as PCP - General (Family Medicine) Paula Milling Tollie Pizza, MD as PCP - Cardiology (Cardiology) Paula Iba, MD as Consulting Physician (Cardiology) Paula Coder, MD as Consulting Physician (Internal Medicine)  CHIEF COMPLAINTS/PURPOSE OF CONSULTATION: ET  Oncology History Overview Note  # ESSENTIAL THROMBOCYTOSIS Paula Clark 2019- 684; July 2020- platelets-992 ; Hb-12 white count- 9];INTERMEDIATE RISK;July 2020-; CALR MUTATION POSITIVE; July 2020- Hydrea 500 mg/day.   # A.fib on xarelto [A.fib]  # Chronic Kidney disease-III   Essential thrombocytosis (HCC)  01/30/2019 Initial Diagnosis   Essential thrombocytosis (HCC)    HISTORY OF PRESENTING ILLNESS: Alone. Ambulating iwith a walker [right knee arthitis]  Paula Clark 82 y.o.  female pleasant patient with essential thrombocytosis most recently on Hydrea is here for follow-up.  No bruising. She has chronic diarrhea and hemorrhoids which will intermittently cause bright red spotting.  No swelling, pain or any symptoms of DVT. Appetite is good   Review of Systems  Constitutional:  Positive for malaise/fatigue. Negative for chills, diaphoresis, fever and weight loss.  HENT:  Negative for nosebleeds and sore throat.   Eyes:  Negative for double vision.  Respiratory:  Negative for cough, hemoptysis, sputum production, shortness of breath and wheezing.   Cardiovascular:  Negative for chest pain, palpitations, orthopnea and leg swelling.  Gastrointestinal:  Positive for diarrhea. Negative for abdominal pain, blood in stool, constipation, heartburn, melena, nausea and vomiting.  Genitourinary:  Negative for dysuria, frequency and urgency.  Musculoskeletal:  Positive for joint pain. Negative for back pain.  Skin: Negative.  Negative for itching and rash.  Neurological:  Negative for dizziness, tingling, focal weakness, weakness and headaches.   Endo/Heme/Allergies:  Does not bruise/bleed easily.  Psychiatric/Behavioral:  Negative for depression. The patient is not nervous/anxious and does not have insomnia.      MEDICAL HISTORY:  Past Medical History:  Diagnosis Date   Chronic atrial fibrillation (HCC)    a. Unsuccessful DCCV 05/2016; b. CHADS2VASc => 4 (HTN, age x 2, female)--> Xarelto.   CKD (chronic kidney disease), stage III (HCC)    Epistaxis    Gout    History of stress test    a. 04/2016 MV: EF 80%, no ischemia.   HLD (hyperlipidemia)    Hypertension    Mitral regurgitation    a. Echo 04/2016: EF 60-65%, no RWMA, mild AI, mild MR, mild biatrial enlargement, PASP 32 mmHg, trivial pericardial effusion noted posterior to the heart   Syncope     SURGICAL HISTORY: Past Surgical History:  Procedure Laterality Date   ABDOMINAL HYSTERECTOMY     APPENDECTOMY     ELECTROPHYSIOLOGIC STUDY N/A 05/18/2016   Procedure: CARDIOVERSION;  Surgeon: Paula Iba, MD;  Location: [facility] ORS;  Service: Cardiovascular;  Laterality: N/A;   OOPHORECTOMY     TONSILLECTOMY     TOTAL VAGINAL HYSTERECTOMY      SOCIAL HISTORY: Social History   Socioeconomic History   Marital status: Widowed    Spouse name: Not on file   Number of children: Not on file   Years of education: Not on file   Highest education level: Not on file  Occupational History   Not on file  Tobacco Use   Smoking status: Former   Smokeless tobacco: Never  Vaping Use   Vaping Use: Never used  Substance and Sexual Activity   Alcohol use: No   Drug use: No   Sexual activity: Not on file  Other Topics Concern   Not on file  Social History Narrative   [name]; self; remote smoking; No alcohol; 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    Social Determinants of Health   Financial Resource Strain: Not on file  Food Insecurity: Not on file  Transportation Needs: Not on file  Physical Activity: Not on file  Stress: Not on file  Social Connections: Not on file   Intimate Partner Violence: Not on file    FAMILY HISTORY: Family History  Problem Relation Age of Onset   Heart failure Mother    Atrial fibrillation Brother     ALLERGIES:  is allergic to codeine.  MEDICATIONS:  Current Outpatient Medications  Medication Sig Dispense Refill   acetaminophen (TYLENOL) 500 MG tablet Take 500-1,000 mg by mouth every 6 (six) hours as needed for moderate pain or headache.     cetirizine (ZYRTEC) 10 MG tablet Take 10 mg by mouth daily.     Cholecalciferol (VITAMIN D3) 1000 units CAPS Take 1,000 Units by mouth daily.     diltiazem (CARDIZEM CD) 240 MG 24 hr capsule TAKE 1 CAPSULE BY MOUTH EVERY DAY 30 capsule 5   doxazosin (CARDURA) 8 MG tablet Take 1 tablet in the AM and 1/2 tab (4 mg) in the evening 45 tablet 5   furosemide (LASIX) 20 MG tablet TAKE 1 TABLET BY MOUTH MON,WED,FRI, AND SAT 48 tablet 1   hydroxyurea (HYDREA) 500 MG capsule Take 500 mg by mouth daily. May take with food to minimize GI side effects.     loperamide (IMODIUM) 2 MG capsule Take 2 mg by mouth as needed for diarrhea or loose stools.     losartan (COZAAR) 25 MG tablet Take 25 mg by mouth daily.     potassium chloride (KLOR-CON) 10 MEQ tablet Take 1 tablet by mouth M,W,F, and Sat with furosemide 16 tablet 5   XARELTO 15 MG TABS tablet TAKE 1 TABLET (15 MG TOTAL) BY MOUTH DAILY WITH SUPPER 90 tablet 1   colchicine 0.6 MG tablet  (Patient not taking: Reported on 08/13/2022)     ondansetron (ZOFRAN) 4 MG tablet Take 1 tablet (4 mg total) by mouth every 8 (eight) hours as needed for nausea or vomiting. (Patient not taking: Reported on 08/13/2022) 20 tablet 0   No current facility-administered medications for this visit.     PHYSICAL EXAMINATION:   Vitals:   11/12/22 1509  BP: (!) 160/67  Pulse: 74  Resp: 18  Temp: (!) 96.3 F (35.7 C)  SpO2: 97%   Filed Weights   11/12/22 1509  Weight: 140 lb (63.5 kg)    Physical Exam HENT:     Head: Normocephalic and atraumatic.      Mouth/Throat:     Pharynx: No oropharyngeal exudate.  Eyes:     Pupils: Pupils are equal, round, and reactive to light.  Cardiovascular:     Rate and Rhythm: Normal rate and regular rhythm.  Pulmonary:     Effort: Pulmonary effort is normal. No respiratory distress.     Breath sounds: Normal breath sounds. No wheezing.  Abdominal:     General: Bowel sounds are normal. There is no distension.     Palpations: Abdomen is soft. There is no mass.     Tenderness: There is no abdominal tenderness. There is no guarding or rebound.  Musculoskeletal:        General: No tenderness. Normal range of motion.     Cervical back: Normal range of motion and neck  supple.  Skin:    General: Skin is warm.  Neurological:     Mental Status: She is alert and oriented to person, place, and time.  Psychiatric:        Mood and Affect: Affect normal.    LABORATORY DATA:  I have reviewed the data as listed Lab Results  Component Value Date   WBC 5.8 11/12/2022   HGB 10.2 (L) 11/12/2022   HCT 31.4 (L) 11/12/2022   MCV 110.2 (H) 11/12/2022   PLT 507 (H) 11/12/2022   Recent Labs    07/09/22 1447 08/13/22 1446 11/12/22 1455  NA 137 137 137  K 4.5 4.3 4.0  CL 102 101 103  CO2 24 26 26   GLUCOSE 135* 111* 119*  BUN 28* 32* 22  CREATININE 1.66* 1.86* 1.51*  CALCIUM 9.4 9.3 8.9  GFRNONAA 30* 27* 34*  PROT 7.5 7.5 7.2  ALBUMIN 3.9 4.0 3.7  AST 24 20 21   ALT 17 14 12   ALKPHOS 75 75 73  BILITOT 0.7 0.2* 0.8     No results found.  ASSESSMENT & PLAN:   Essential thrombocytosis (HCC) #Essential thrombocytosis-CALR positive; Intermediate risk given her age- on Hydrea once a day.   Worsening renal insufficiency- see below.  # Today platelets 507 hemoglobin 10.4 white count normal at 8.4.  Recommend  continue hydrea 500 mg one day.  Stable.   # CKD stage III-IV[GFR-]- on diuretic- slowly worse at GFR  34- defer to PCP/card [Dr.Gollan]; JAN 2024- US kidneys - negative for obstruction. No nephrology  evaluation.   # Anemia/ ? Intermittent rectal bleeding- [>> colonoscopy- Dr.Andy Lamb]; Discussed re: repeating colonoscopy-   # A.fib- on xarelto 15 mg/day [CKD; Dr.Gollan]; no concerns blood loss. -stable.    # DISPOSITION:  # check iron studies; ferritin- add to labs today # follow up in 3 month-MD-labs; cbc/cmp/LDH-Dr.B    Paula Coder, MD 11/12/2022 3:41 PM

## 2022-11-12 NOTE — Progress Notes (Signed)
No bruising.  She has chronic diarrhea and hemorrhoids which will intermittently cause bright red spotting. No swelling, pain or any symptoms of DVT. Appetite is good.

## 2022-11-12 NOTE — Progress Notes (Signed)
Bruising: Bleeding from gums:  

## 2022-11-15 LAB — KAPPA/LAMBDA LIGHT CHAINS
Kappa free light chain: 93.7 mg/L — ABNORMAL HIGH (ref 3.3–19.4)
Kappa, lambda light chain ratio: 1.82 — ABNORMAL HIGH (ref 0.26–1.65)
Lambda free light chains: 51.6 mg/L — ABNORMAL HIGH (ref 5.7–26.3)

## 2022-11-19 LAB — MULTIPLE MYELOMA PANEL, SERUM
Albumin SerPl Elph-Mcnc: 3.5 g/dL (ref 2.9–4.4)
Albumin/Glob SerPl: 1.1 (ref 0.7–1.7)
Alpha 1: 0.3 g/dL (ref 0.0–0.4)
Alpha2 Glob SerPl Elph-Mcnc: 0.6 g/dL (ref 0.4–1.0)
B-Globulin SerPl Elph-Mcnc: 1.1 g/dL (ref 0.7–1.3)
Gamma Glob SerPl Elph-Mcnc: 1.3 g/dL (ref 0.4–1.8)
Globulin, Total: 3.3 g/dL (ref 2.2–3.9)
IgA: 268 mg/dL (ref 64–422)
IgG (Immunoglobin G), Serum: 1343 mg/dL (ref 586–1602)
IgM (Immunoglobulin M), Srm: 152 mg/dL (ref 26–217)
Total Protein ELP: 6.8 g/dL (ref 6.0–8.5)

## 2022-11-22 ENCOUNTER — Other Ambulatory Visit: Payer: Self-pay | Admitting: Cardiovascular Disease

## 2022-11-22 DIAGNOSIS — I4819 Other persistent atrial fibrillation: Secondary | ICD-10-CM

## 2022-11-22 NOTE — Telephone Encounter (Signed)
Prescription refill request for Xarelto received.  Indication:afib Last office visit:12/23 Weight:63.5  kg Age:82 Scr:1.5 CrCl:27.99  ml/min  Prescription refilled

## 2022-11-22 NOTE — Telephone Encounter (Signed)
Refill Request.  

## 2022-12-19 NOTE — Progress Notes (Deleted)
Cardiology Office Note  Date:  12/19/2022   ID:  Paula Clark, DOB 11/11/1940, MRN [id number]  PCP:  Kandyce Rud, MD   No chief complaint on file.   HPI:  82 year old woman With history of  hypertension,  persistent atrial fibrillation,  CHADSVASc score of at least 4 syncope in the setting of dehydration,  Stress test 04/30/2016 no ischemia Attempted cardioversion for atrial fibrillation 05/18/2016, failed  severe epistaxis right nostril 04/07/2016 emergency room 04/11/2016 with epistaxis x4,  seen by ENT in the emergency room Admitted to the hospital 04/12/2016 Referred to Dr. Excell Seltzer for watchman device CRI Gout Prior chemo for bone marrow cancer/high platelets Who presents for follow-up of her chronic atrial fibrillation  LOV December 2023  Feels tired , SOB "My tail has been dragging" Cough past 2 weeks Worsening leg swelling, not taking Lasix  Lab work reviewed Hemoglobin 7.7 up from 6.5 in Nov 2023  down from 10.7 in sept 2023 Recently received blood transfusion May 26, 2022 On Hydrea for bone marrow cancer History of thrombocytopenia Now off hydrea,  plts 579  Echocardiogram May 2023 ejection fraction 60 to 65%, right heart pressures 43 mmHg, moderately dilated left atrium Ascending aorta 4 cm  On xarelto 15 mg daily Creatinine clearance 30  Labs reviewed CR 1.5 Not on lasix regularly  Blood pressure elevated today, not monitoring blood pressure at home Remains on Cardura 8 mg in the morning, diltiazem 240  EKG personally reviewed by myself on todays visit Atrial fib rate 71 bpm  Other past medical history reviewed evaluated by Dr. Excell Seltzer November 2017 for watchman device  Dr. Excell Seltzer was going to discuss case with ENT physician prior to scheduling TEE There was some concern that she would not tolerate aspirin and Plavix TEE was never performed    Noted to be in atrial fibrillation in the emergency room, no prior EKGs available but patient  responded at that time that she had atrial fibrillation before but was not on anticoagulation  Admitted back to the hospital 04/16/2016 with fall, weakness, dehydration, creatinine 1.6 (improved with IV hydration overnight), discharge on 04/18/2016  She was seen by our office, ryan Dunn 04/21/2016, scheduled for cardioversion Cardioversion attempted 05/18/2016 unsuccessful.  suspect  possibly chronic   PMH:   has a past medical history of Chronic atrial fibrillation (HCC), CKD (chronic kidney disease), stage III (HCC), Epistaxis, Gout, History of stress test, HLD (hyperlipidemia), Hypertension, Mitral regurgitation, and Syncope.  PSH:    Past Surgical History:  Procedure Laterality Date   ABDOMINAL HYSTERECTOMY     APPENDECTOMY     ELECTROPHYSIOLOGIC STUDY N/A 05/18/2016   Procedure: CARDIOVERSION;  Surgeon: Antonieta Iba, MD;  Location: [facility] ORS;  Service: Cardiovascular;  Laterality: N/A;   OOPHORECTOMY     TONSILLECTOMY     TOTAL VAGINAL HYSTERECTOMY      Current Outpatient Medications  Medication Sig Dispense Refill   acetaminophen (TYLENOL) 500 MG tablet Take 500-1,000 mg by mouth every 6 (six) hours as needed for moderate pain or headache.     cetirizine (ZYRTEC) 10 MG tablet Take 10 mg by mouth daily.     Cholecalciferol (VITAMIN D3) 1000 units CAPS Take 1,000 Units by mouth daily.     colchicine 0.6 MG tablet  (Patient not taking: Reported on 08/13/2022)     diltiazem (CARDIZEM CD) 240 MG 24 hr capsule TAKE 1 CAPSULE BY MOUTH EVERY DAY 30 capsule 5   doxazosin (CARDURA) 8 MG tablet Take 1 tablet in  the AM and 1/2 tab (4 mg) in the evening 45 tablet 5   furosemide (LASIX) 20 MG tablet TAKE 1 TABLET BY MOUTH MON,WED,FRI, AND SAT 48 tablet 1   hydroxyurea (HYDREA) 500 MG capsule Take 500 mg by mouth daily. May take with food to minimize GI side effects.     loperamide (IMODIUM) 2 MG capsule Take 2 mg by mouth as needed for diarrhea or loose stools.     losartan (COZAAR) 25 MG  tablet Take 25 mg by mouth daily.     ondansetron (ZOFRAN) 4 MG tablet Take 1 tablet (4 mg total) by mouth every 8 (eight) hours as needed for nausea or vomiting. (Patient not taking: Reported on 08/13/2022) 20 tablet 0   potassium chloride (KLOR-CON) 10 MEQ tablet TAKE 1 TABLET BY MOUTH MON,WED,FRI, AND SAT WITH FUROSEMIDE 48 tablet 0   XARELTO 15 MG TABS tablet TAKE 1 TABLET (15 MG TOTAL) BY MOUTH DAILY WITH SUPPER 90 tablet 1   No current facility-administered medications for this visit.    Allergies:   Codeine   Social History:  The patient  reports that she has quit smoking. She has never used smokeless tobacco. She reports that she does not drink alcohol and does not use drugs.   Family History:   family history includes Atrial fibrillation in her brother; Heart failure in her mother.    Review of Systems: Review of Systems  Constitutional:  Positive for malaise/fatigue.  HENT: Negative.    Respiratory:  Positive for cough and shortness of breath.   Cardiovascular:  Positive for leg swelling.  Gastrointestinal: Negative.   Musculoskeletal: Negative.   Neurological: Negative.   Psychiatric/Behavioral: Negative.    All other systems reviewed and are negative.   PHYSICAL EXAM: VS:  There were no vitals taken for this visit. , BMI There is no height or weight on file to calculate BMI. Constitutional:  oriented to person, place, and time. No distress.  HENT:  Head: Grossly normal Eyes:  no discharge. No scleral icterus.  Neck: No JVD, no carotid bruits  Cardiovascular: Irregularly irregular no murmurs appreciated Trace to 1+ pitting bilateral lower extremity edema Pulmonary/Chest: Clear to auscultation bilaterally, no wheezes or rales Abdominal: Soft.  no distension.  no tenderness.  Musculoskeletal: Normal range of motion Neurological:  normal muscle tone. Coordination normal. No atrophy Skin: Skin warm and dry Psychiatric: normal affect, pleasant  Recent Labs: 11/12/2022:  ALT 12; BUN 22; Creatinine, Ser 1.51; Hemoglobin 10.2; Platelets 507; Potassium 4.0; Sodium 137    Lipid Panel No results found for: "CHOL", "HDL", "LDLCALC", "TRIG"    Wt Readings from Last 3 Encounters:  11/12/22 140 lb (63.5 kg)  08/13/22 134 lb (60.8 kg)  07/09/22 135 lb 11.2 oz (61.6 kg)      ASSESSMENT AND PLAN:  Permanent atrial fibrillation (HCC) - Plan: EKG 12-Lead  tolerating xarelto 15 mg daily, no bleeding Rate well controlled on diltiazem  Diastolic CHF Currently not taking Lasix but having symptoms concerning for PND/orthopnea with lower extremity edema Recommend she start Lasix 20 mg every other day with potassium 10 every other day  Essential hypertension -  Blood pressure running high on today's visit 170 systolic on my check Continues to take Cardizem 240 morning, also takes doxazosin 8 mg in the morning Recommend she add additional doxazosin 4 mg in the evening and monitor blood pressure at home and call us with numbers  Epistaxis Prior history, none recently Tolerating anticoagulation, xarelto 15 mg daily  given low creatinine clearance less than 50 Has anemia, no signs of GI bleeding  Weakness Suggest walking program  Murmur Echocardiogram 11/2021 Sclerosis of aortic valve noted   Total encounter time more than 30 minutes  Greater than 50% was spent in counseling and coordination of care with the patient   No orders of the defined types were placed in this encounter.    Signed, Dossie Arbour, M.D., Ph.D. 12/19/2022  [facility] [place], [place] [phone]

## 2022-12-20 ENCOUNTER — Ambulatory Visit: Payer: Medicare HMO | Attending: Cardiovascular Disease | Admitting: Cardiovascular Disease

## 2022-12-20 DIAGNOSIS — N183 Chronic kidney disease, stage 3 unspecified: Secondary | ICD-10-CM

## 2022-12-20 DIAGNOSIS — I160 Hypertensive urgency: Secondary | ICD-10-CM

## 2022-12-20 DIAGNOSIS — I10 Essential (primary) hypertension: Secondary | ICD-10-CM

## 2022-12-20 DIAGNOSIS — R531 Weakness: Secondary | ICD-10-CM

## 2022-12-20 DIAGNOSIS — I4819 Other persistent atrial fibrillation: Secondary | ICD-10-CM

## 2022-12-20 DIAGNOSIS — R0602 Shortness of breath: Secondary | ICD-10-CM

## 2022-12-23 ENCOUNTER — Encounter: Payer: Self-pay | Admitting: Cardiovascular Disease

## 2023-01-09 NOTE — Progress Notes (Unsigned)
Cardiology Office Note  Date:  01/10/2023   ID:  Paula Clark, DOB 01-04-1941, MRN [id number]  PCP:  Kandyce Rud, MD   Chief Complaint  Patient presents with   6 month follow up     Patient c/o weakness/fatigue/tiredness & shortness of breath with exertion. Medications reviewed by the patient verbally.     HPI:  82 year old woman With history of  hypertension,  persistent atrial fibrillation,  CHADSVASc score of at least 4 syncope in the setting of dehydration,  Stress test 04/30/2016 no ischemia Attempted cardioversion for atrial fibrillation 05/18/2016, failed  severe epistaxis right nostril 04/07/2016 emergency room 04/11/2016 with epistaxis x4,  seen by ENT in the emergency room Admitted to the hospital 04/12/2016 Referred to Dr. Excell Seltzer for watchman device CRI Gout Prior chemo for bone marrow cancer/high platelets Who presents for follow-up of her chronic atrial fibrillation  LOV December 2023 In follow-up today reports that in general doing relatively well No regular exercise program Able to do her basic ADLs, some shopping  Episodes of low energy, will have to sit down at times to recover  Reports 1 episode after grocery shopping, developed tachycardia, sat down Resolved after several minutes  No intervention needed, no further episodes Thinks it because she overdid it  Leg swelling better on lasix every other day Slight climbing creatinine but stable, reports breathing stable  Labs reviewed HGB 10.2 CR 1.5  EKG personally reviewed by myself on todays visit EKG Interpretation Date/Time:  Monday January 10 2023 16:36:37 EDT Ventricular Rate:  64 PR Interval:    QRS Duration:  84 QT Interval:  418 QTC Calculation: 431 R Axis:   74  Text Interpretation: Atrial fibrillation When compared with ECG of 18-May-2016 07:10, No significant change was found Confirmed by Julien Nordmann [phone]) on 01/10/2023 4:51:52 PM    Lab work reviewed Hemoglobin 7.7 up from  6.5 in Nov 2023  down from 10.7 in sept 2023 blood transfusion May 26, 2022 On Hydrea for bone marrow cancer History of thrombocytopenia Now off hydrea,   Echocardiogram May 2023 ejection fraction 60 to 65%, right heart pressures 43 mmHg, moderately dilated left atrium Ascending aorta 4 cm  On xarelto 15 mg daily Creatinine clearance 30  Other past medical history reviewed evaluated by Dr. Excell Seltzer November 2017 for watchman device  Dr. Excell Seltzer was going to discuss case with ENT physician prior to scheduling TEE There was some concern that she would not tolerate aspirin and Plavix TEE was never performed    Noted to be in atrial fibrillation in the emergency room, no prior EKGs available but patient responded at that time that she had atrial fibrillation before but was not on anticoagulation  Admitted back to the hospital 04/16/2016 with fall, weakness, dehydration, creatinine 1.6 (improved with IV hydration overnight), discharge on 04/18/2016  She was seen by our office, ryan Dunn 04/21/2016, scheduled for cardioversion Cardioversion attempted 05/18/2016 unsuccessful.  suspect  possibly chronic   PMH:   has a past medical history of Chronic atrial fibrillation (HCC), CKD (chronic kidney disease), stage III (HCC), Epistaxis, Gout, History of stress test, HLD (hyperlipidemia), Hypertension, Mitral regurgitation, and Syncope.  PSH:    Past Surgical History:  Procedure Laterality Date   ABDOMINAL HYSTERECTOMY     APPENDECTOMY     ELECTROPHYSIOLOGIC STUDY N/A 05/18/2016   Procedure: CARDIOVERSION;  Surgeon: Antonieta Iba, MD;  Location: [facility] ORS;  Service: Cardiovascular;  Laterality: N/A;   OOPHORECTOMY     TONSILLECTOMY  TOTAL VAGINAL HYSTERECTOMY      Current Outpatient Medications  Medication Sig Dispense Refill   acetaminophen (TYLENOL) 500 MG tablet Take 500-1,000 mg by mouth every 6 (six) hours as needed for moderate pain or headache.     cetirizine (ZYRTEC) 10 MG  tablet Take 10 mg by mouth daily.     Cholecalciferol (VITAMIN D3) 1000 units CAPS Take 1,000 Units by mouth daily.     diltiazem (CARDIZEM CD) 240 MG 24 hr capsule TAKE 1 CAPSULE BY MOUTH EVERY DAY 30 capsule 5   doxazosin (CARDURA) 8 MG tablet Take 1 tablet in the AM and 1/2 tab (4 mg) in the evening 45 tablet 5   furosemide (LASIX) 20 MG tablet TAKE 1 TABLET BY MOUTH MON,WED,FRI, AND SAT 48 tablet 1   hydroxyurea (HYDREA) 500 MG capsule Take 500 mg by mouth daily. May take with food to minimize GI side effects.     loperamide (IMODIUM) 2 MG capsule Take 2 mg by mouth as needed for diarrhea or loose stools.     losartan (COZAAR) 25 MG tablet Take 25 mg by mouth daily.     ondansetron (ZOFRAN) 4 MG tablet Take 1 tablet (4 mg total) by mouth every 8 (eight) hours as needed for nausea or vomiting. 20 tablet 0   potassium chloride (KLOR-CON) 10 MEQ tablet TAKE 1 TABLET BY MOUTH MON,WED,FRI, AND SAT WITH FUROSEMIDE 48 tablet 0   XARELTO 15 MG TABS tablet TAKE 1 TABLET (15 MG TOTAL) BY MOUTH DAILY WITH SUPPER 90 tablet 1   colchicine 0.6 MG tablet  (Patient not taking: Reported on 08/13/2022)     No current facility-administered medications for this visit.    Allergies:   Codeine   Social History:  The patient  reports that she has quit smoking. She has never used smokeless tobacco. She reports that she does not drink alcohol and does not use drugs.   Family History:   family history includes Atrial fibrillation in her brother; Heart failure in her mother.    Review of Systems: Review of Systems  Constitutional:  Positive for malaise/fatigue.  HENT: Negative.    Respiratory: Negative.    Cardiovascular: Negative.   Gastrointestinal: Negative.   Musculoskeletal: Negative.   Neurological: Negative.   Psychiatric/Behavioral: Negative.    All other systems reviewed and are negative.   PHYSICAL EXAM: VS:  BP (!) 128/48 (BP Location: Left Arm, Patient Position: Sitting, Cuff Size: Normal)    Pulse 64   Ht 5\' 2"  (1.575 m)   Wt 140 lb 2 oz (63.6 kg)   SpO2 98%   BMI 25.63 kg/m  , BMI Body mass index is 25.63 kg/m. Constitutional:  oriented to person, place, and time. No distress.  HENT:  Head: Grossly normal Eyes:  no discharge. No scleral icterus.  Neck: No JVD, no carotid bruits  Cardiovascular: Irregularly irregular no murmurs appreciated Trace to 1+ pitting bilateral lower extremity edema Pulmonary/Chest: Clear to auscultation bilaterally, no wheezes or rales Abdominal: Soft.  no distension.  no tenderness.  Musculoskeletal: Normal range of motion Neurological:  normal muscle tone. Coordination normal. No atrophy Skin: Skin warm and dry Psychiatric: normal affect, pleasant  Recent Labs: 11/12/2022: ALT 12; BUN 22; Creatinine, Ser 1.51; Hemoglobin 10.2; Platelets 507; Potassium 4.0; Sodium 137   Lipid Panel No results found for: "CHOL", "HDL", "LDLCALC", "TRIG"    Wt Readings from Last 3 Encounters:  01/10/23 140 lb 2 oz (63.6 kg)  11/12/22 140 lb (63.5 kg)  08/13/22 134 lb (60.8 kg)      ASSESSMENT AND PLAN:  Permanent atrial fibrillation (HCC) - Plan: EKG 12-Lead  tolerating xarelto 15 mg daily, no bleeding Rate well controlled on diltiazem Reports 1 episode after grocery shopping with elevated heart rate, resolves with sitting down and resting For abnormal paroxysmal tachycardia recommended she call us, may need diltiazem 30 mg pill or metoprolol to take as needed  Diastolic CHF Reports leg edema and breathing stable on Lasix every other day, creatinine stable 1.5  Essential hypertension -  Blood pressure reasonable on today's visit in the 120s, recommended she decrease Cardura down to 4 mg twice daily given her episodes of weakness, malaise, unable to exclude hypotension.  8 mg in the morning is large dose for a senior  Epistaxis Prior history, none recently xarelto 15 mg daily given low creatinine clearance less than 50 Hemoglobin 10,  stable  Weakness Suggest walking program Changed to blood pressure medication as above  Murmur Echocardiogram 11/2021 Sclerosis of aortic valve noted   Total encounter time more than 30 minutes  Greater than 50% was spent in counseling and coordination of care with the patient   Orders Placed This Encounter  Procedures   EKG 12-Lead     Signed, Dossie Arbour, M.D., Ph.D. 01/10/2023  [facility] [place], [place] [phone]

## 2023-01-10 ENCOUNTER — Encounter: Payer: Self-pay | Admitting: Cardiovascular Disease

## 2023-01-10 ENCOUNTER — Ambulatory Visit: Payer: Medicare HMO | Attending: Cardiovascular Disease | Admitting: Cardiovascular Disease

## 2023-01-10 VITALS — BP 128/48 | HR 64 | Ht 62.0 in | Wt 140.1 lb

## 2023-01-10 DIAGNOSIS — I4819 Other persistent atrial fibrillation: Secondary | ICD-10-CM | POA: Diagnosis not present

## 2023-01-10 DIAGNOSIS — N183 Chronic kidney disease, stage 3 unspecified: Secondary | ICD-10-CM | POA: Diagnosis not present

## 2023-01-10 DIAGNOSIS — R011 Cardiac murmur, unspecified: Secondary | ICD-10-CM

## 2023-01-10 DIAGNOSIS — I160 Hypertensive urgency: Secondary | ICD-10-CM | POA: Diagnosis not present

## 2023-01-10 DIAGNOSIS — R531 Weakness: Secondary | ICD-10-CM | POA: Diagnosis not present

## 2023-01-10 DIAGNOSIS — I10 Essential (primary) hypertension: Secondary | ICD-10-CM | POA: Diagnosis not present

## 2023-01-10 DIAGNOSIS — R0602 Shortness of breath: Secondary | ICD-10-CM

## 2023-01-10 MED ORDER — DOXAZOSIN MESYLATE 8 MG PO TABS: ORAL_TABLET | ORAL | 3 refills | Status: DC

## 2023-01-10 NOTE — Patient Instructions (Signed)
Medication Instructions:  Please decrease the cardura/doxazosin down to 4 mg twice a day   If you need a refill on your cardiac medications before your next appointment, please call your pharmacy.   Lab work: No new labs needed  Testing/Procedures: No new testing needed  Follow-Up: At [facility], you and your health needs are our priority.  As part of our continuing mission to provide you with exceptional heart care, we have created designated Provider Care Teams.  These Care Teams include your primary Cardiologist (physician) and Advanced Practice Providers (APPs -  Physician Assistants and Nurse Practitioners) who all work together to provide you with the care you need, when you need it.  You will need a follow up appointment in 12 months  Providers on your designated Care Team:   Nicolasa Ducking, NP Eula Listen, PA-C Cadence Fransico Michael, [place]  COVID-19 Vaccine Information can be found at: [email or web address] For questions related to vaccine distribution or appointments, please email [email or web address] or call [phone].

## 2023-01-12 ENCOUNTER — Other Ambulatory Visit: Payer: Self-pay | Admitting: Cardiovascular Disease

## 2023-01-20 DIAGNOSIS — I10 Essential (primary) hypertension: Secondary | ICD-10-CM | POA: Diagnosis not present

## 2023-01-20 DIAGNOSIS — N1832 Chronic kidney disease, stage 3b: Secondary | ICD-10-CM | POA: Diagnosis not present

## 2023-01-20 DIAGNOSIS — Z79899 Other long term (current) drug therapy: Secondary | ICD-10-CM | POA: Diagnosis not present

## 2023-01-27 DIAGNOSIS — Z79899 Other long term (current) drug therapy: Secondary | ICD-10-CM | POA: Diagnosis not present

## 2023-01-27 DIAGNOSIS — Z0000 Encounter for general adult medical examination without abnormal findings: Secondary | ICD-10-CM | POA: Diagnosis not present

## 2023-01-27 DIAGNOSIS — Z1331 Encounter for screening for depression: Secondary | ICD-10-CM | POA: Diagnosis not present

## 2023-01-27 DIAGNOSIS — N189 Chronic kidney disease, unspecified: Secondary | ICD-10-CM | POA: Diagnosis not present

## 2023-02-06 ENCOUNTER — Other Ambulatory Visit: Payer: Self-pay | Admitting: Cardiovascular Disease

## 2023-02-08 DIAGNOSIS — Z85820 Personal history of malignant melanoma of skin: Secondary | ICD-10-CM | POA: Diagnosis not present

## 2023-02-08 DIAGNOSIS — L821 Other seborrheic keratosis: Secondary | ICD-10-CM | POA: Diagnosis not present

## 2023-02-08 DIAGNOSIS — Z85828 Personal history of other malignant neoplasm of skin: Secondary | ICD-10-CM | POA: Diagnosis not present

## 2023-02-08 DIAGNOSIS — Z08 Encounter for follow-up examination after completed treatment for malignant neoplasm: Secondary | ICD-10-CM | POA: Diagnosis not present

## 2023-02-08 DIAGNOSIS — D225 Melanocytic nevi of trunk: Secondary | ICD-10-CM | POA: Diagnosis not present

## 2023-02-08 DIAGNOSIS — D0439 Carcinoma in situ of skin of other parts of face: Secondary | ICD-10-CM | POA: Diagnosis not present

## 2023-02-08 DIAGNOSIS — D485 Neoplasm of uncertain behavior of skin: Secondary | ICD-10-CM | POA: Diagnosis not present

## 2023-02-08 DIAGNOSIS — D0461 Carcinoma in situ of skin of right upper limb, including shoulder: Secondary | ICD-10-CM | POA: Diagnosis not present

## 2023-02-11 ENCOUNTER — Inpatient Hospital Stay: Payer: Medicare HMO | Attending: Internal Medicine

## 2023-02-11 ENCOUNTER — Encounter: Payer: Self-pay | Admitting: Internal Medicine

## 2023-02-11 ENCOUNTER — Other Ambulatory Visit: Payer: Medicare HMO

## 2023-02-11 ENCOUNTER — Ambulatory Visit: Payer: Medicare HMO | Admitting: Internal Medicine

## 2023-02-11 ENCOUNTER — Other Ambulatory Visit: Payer: Self-pay

## 2023-02-11 ENCOUNTER — Inpatient Hospital Stay: Payer: Medicare HMO | Admitting: Internal Medicine

## 2023-02-11 VITALS — BP 156/58 | HR 86 | Temp 98.9°F | Ht 62.0 in | Wt 137.8 lb

## 2023-02-11 DIAGNOSIS — R059 Cough, unspecified: Secondary | ICD-10-CM | POA: Insufficient documentation

## 2023-02-11 DIAGNOSIS — Z87891 Personal history of nicotine dependence: Secondary | ICD-10-CM | POA: Diagnosis not present

## 2023-02-11 DIAGNOSIS — D75839 Thrombocytosis, unspecified: Secondary | ICD-10-CM | POA: Insufficient documentation

## 2023-02-11 DIAGNOSIS — D473 Essential (hemorrhagic) thrombocythemia: Secondary | ICD-10-CM

## 2023-02-11 DIAGNOSIS — N184 Chronic kidney disease, stage 4 (severe): Secondary | ICD-10-CM | POA: Diagnosis not present

## 2023-02-11 DIAGNOSIS — I482 Chronic atrial fibrillation, unspecified: Secondary | ICD-10-CM | POA: Insufficient documentation

## 2023-02-11 DIAGNOSIS — Z7901 Long term (current) use of anticoagulants: Secondary | ICD-10-CM | POA: Diagnosis not present

## 2023-02-11 DIAGNOSIS — R0602 Shortness of breath: Secondary | ICD-10-CM | POA: Diagnosis not present

## 2023-02-11 LAB — CBC WITH DIFFERENTIAL (CANCER CENTER ONLY)
Abs Immature Granulocytes: 0.08 10*3/uL — ABNORMAL HIGH (ref 0.00–0.07)
Basophils Absolute: 0 10*3/uL (ref 0.0–0.1)
Basophils Relative: 1 %
Eosinophils Absolute: 0.3 10*3/uL (ref 0.0–0.5)
Eosinophils Relative: 4 %
HCT: 29.5 % — ABNORMAL LOW (ref 36.0–46.0)
Hemoglobin: 9.8 g/dL — ABNORMAL LOW (ref 12.0–15.0)
Immature Granulocytes: 1 %
Lymphocytes Relative: 14 %
Lymphs Abs: 0.9 10*3/uL (ref 0.7–4.0)
MCH: 37.3 pg — ABNORMAL HIGH (ref 26.0–34.0)
MCHC: 33.2 g/dL (ref 30.0–36.0)
MCV: 112.2 fL — ABNORMAL HIGH (ref 80.0–100.0)
Monocytes Absolute: 1.2 10*3/uL — ABNORMAL HIGH (ref 0.1–1.0)
Monocytes Relative: 18 %
Neutro Abs: 3.9 10*3/uL (ref 1.7–7.7)
Neutrophils Relative %: 62 %
Platelet Count: 621 10*3/uL — ABNORMAL HIGH (ref 150–400)
RBC: 2.63 MIL/uL — ABNORMAL LOW (ref 3.87–5.11)
RDW: 13.5 % (ref 11.5–15.5)
WBC Count: 6.3 10*3/uL (ref 4.0–10.5)
nRBC: 0 % (ref 0.0–0.2)

## 2023-02-11 LAB — CMP (CANCER CENTER ONLY)
ALT: 12 U/L (ref 0–44)
AST: 17 U/L (ref 15–41)
Albumin: 3.4 g/dL — ABNORMAL LOW (ref 3.5–5.0)
Alkaline Phosphatase: 73 U/L (ref 38–126)
Anion gap: 9 (ref 5–15)
BUN: 30 mg/dL — ABNORMAL HIGH (ref 8–23)
CO2: 22 mmol/L (ref 22–32)
Calcium: 9.3 mg/dL (ref 8.9–10.3)
Chloride: 103 mmol/L (ref 98–111)
Creatinine: 1.45 mg/dL — ABNORMAL HIGH (ref 0.44–1.00)
GFR, Estimated: 36 mL/min — ABNORMAL LOW (ref >60)
Glucose, Bld: 135 mg/dL — ABNORMAL HIGH (ref 70–99)
Potassium: 4.3 mmol/L (ref 3.5–5.1)
Sodium: 134 mmol/L — ABNORMAL LOW (ref 135–145)
Total Bilirubin: 0.4 mg/dL (ref 0.3–1.2)
Total Protein: 6.8 g/dL (ref 6.5–8.1)

## 2023-02-11 LAB — LACTATE DEHYDROGENASE: LDH: 240 U/L — ABNORMAL HIGH (ref 98–192)

## 2023-02-11 NOTE — Progress Notes (Signed)
[facility] CONSULT NOTE  Patient Care Team: Kandyce Rud, MD as PCP - General (Family Medicine) Mariah Milling Tollie Pizza, MD as PCP - Cardiology (Cardiology) Antonieta Iba, MD as Consulting Physician (Cardiology) Earna Coder, MD as Consulting Physician (Internal Medicine)  CHIEF COMPLAINTS/PURPOSE OF CONSULTATION: ET  Oncology History Overview Note  # ESSENTIAL THROMBOCYTOSIS [name] 2019- 684; July 2020- platelets-992 ; Hb-12 white count- 9];INTERMEDIATE RISK;July 2020-; CALR MUTATION POSITIVE; July 2020- Hydrea 500 mg/day.   # A.fib on xarelto [A.fib]  # Chronic Kidney disease-III   Essential thrombocytosis (HCC)  01/30/2019 Initial Diagnosis   Essential thrombocytosis (HCC)    HISTORY OF PRESENTING ILLNESS: Alone. Ambulating independently.   Paula Clark 82 y.o.  female pleasant patient with essential thrombocytosis most recently on Hydrea; CKD stage III A-fib on Eliquis, CHF is here for follow-up.  Patient complains of worsening fatigue.  Complains of worsening shortness of breath.  Also complains of unremitting ongoing cough for the last few weeks.  Mild phlegm.  Also complains of sneezing.  Denies any worsening bleeding. She has chronic diarrhea and hemorrhoids which will intermittently- may be once a month. No swelling in legs.  She is current on Lasix as per cardiology.  Review of Systems  Constitutional:  Positive for malaise/fatigue. Negative for chills, diaphoresis, fever and weight loss.  HENT:  Negative for nosebleeds and sore throat.   Eyes:  Negative for double vision.  Respiratory:  Positive for cough and shortness of breath. Negative for hemoptysis, sputum production and wheezing.   Cardiovascular:  Negative for chest pain, palpitations, orthopnea and leg swelling.  Gastrointestinal:  Positive for diarrhea. Negative for abdominal pain, blood in stool, constipation, heartburn, melena, nausea and vomiting.  Genitourinary:  Negative for  dysuria, frequency and urgency.  Musculoskeletal:  Positive for joint pain. Negative for back pain.  Skin: Negative.  Negative for itching and rash.  Neurological:  Negative for dizziness, tingling, focal weakness, weakness and headaches.  Endo/Heme/Allergies:  Does not bruise/bleed easily.  Psychiatric/Behavioral:  Negative for depression. The patient is not nervous/anxious and does not have insomnia.      MEDICAL HISTORY:  Past Medical History:  Diagnosis Date   Chronic atrial fibrillation (HCC)    a. Unsuccessful DCCV 05/2016; b. CHADS2VASc => 4 (HTN, age x 2, female)--> Xarelto.   CKD (chronic kidney disease), stage III (HCC)    Epistaxis    Gout    History of stress test    a. 04/2016 MV: EF 80%, no ischemia.   HLD (hyperlipidemia)    Hypertension    Mitral regurgitation    a. Echo 04/2016: EF 60-65%, no RWMA, mild AI, mild MR, mild biatrial enlargement, PASP 32 mmHg, trivial pericardial effusion noted posterior to the heart   Syncope     SURGICAL HISTORY: Past Surgical History:  Procedure Laterality Date   ABDOMINAL HYSTERECTOMY     APPENDECTOMY     ELECTROPHYSIOLOGIC STUDY N/A 05/18/2016   Procedure: CARDIOVERSION;  Surgeon: Antonieta Iba, MD;  Location: [facility] ORS;  Service: Cardiovascular;  Laterality: N/A;   OOPHORECTOMY     TONSILLECTOMY     TOTAL VAGINAL HYSTERECTOMY      SOCIAL HISTORY: Social History   Socioeconomic History   Marital status: Widowed    Spouse name: Not on file   Number of children: Not on file   Years of education: Not on file   Highest education level: Not on file  Occupational History   Not on file  Tobacco  Use   Smoking status: Former   Smokeless tobacco: Never  Vaping Use   Vaping status: Never Used  Substance and Sexual Activity   Alcohol use: No   Drug use: No   Sexual activity: Not on file  Other Topics Concern   Not on file  Social History Narrative   [name]; self; remote smoking; No alcohol; 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    Social Determinants of Health   Financial Resource Strain: Low Risk  (01/27/2023)   Received from [facility]   Overall Financial Resource Strain (CARDIA)    Difficulty of Paying Living Expenses: Not hard at all  Food Insecurity: No Food Insecurity (01/27/2023)   Received from [facility]   Hunger Vital Sign    Worried About Running Out of Food in the Last Year: Never true    Ran Out of Food in the Last Year: Never true  Transportation Needs: No Transportation Needs (01/27/2023)   Received from [facility] - Transportation    In the past 12 months, has lack of transportation kept you from medical appointments or from getting medications?: No    Lack of Transportation (Non-Medical): No  Physical Activity: Not on file  Stress: Not on file  Social Connections: Not on file  Intimate Partner Violence: Not on file    FAMILY HISTORY: Family History  Problem Relation Age of Onset   Heart failure Mother    Atrial fibrillation Brother     ALLERGIES:  is allergic to codeine.  MEDICATIONS:  Current Outpatient Medications  Medication Sig Dispense Refill   acetaminophen (TYLENOL) 500 MG tablet Take 500-1,000 mg by mouth every 6 (six) hours as needed for moderate pain or headache.     cetirizine (ZYRTEC) 10 MG tablet Take 10 mg by mouth daily.     Cholecalciferol (VITAMIN D3) 1000 units CAPS Take 1,000 Units by mouth daily.     diltiazem (CARDIZEM CD) 240 MG 24 hr capsule TAKE 1 CAPSULE BY MOUTH EVERY DAY 90 capsule 2   doxazosin (CARDURA) 8 MG tablet Take 1/2 tab (4 mg) tablet in the AM and 1/2 tab (4 mg) in the evening 90 tablet 3   furosemide (LASIX) 20 MG tablet TAKE 1 TABLET BY MOUTH MON,WED,FRI, AND SAT 60 tablet 1   hydroxyurea (HYDREA) 500 MG capsule Take 500 mg by mouth daily. May take with food to minimize GI side effects.     loperamide (IMODIUM) 2 MG capsule Take 2 mg by mouth as needed for  diarrhea or loose stools.     losartan (COZAAR) 25 MG tablet Take 25 mg by mouth daily.     ondansetron (ZOFRAN) 4 MG tablet Take 1 tablet (4 mg total) by mouth every 8 (eight) hours as needed for nausea or vomiting. 20 tablet 0   potassium chloride (KLOR-CON) 10 MEQ tablet TAKE 1 TABLET BY MOUTH MON,WED,FRI, AND SAT WITH FUROSEMIDE 48 tablet 1   XARELTO 15 MG TABS tablet TAKE 1 TABLET (15 MG TOTAL) BY MOUTH DAILY WITH SUPPER 90 tablet 1   colchicine 0.6 MG tablet  (Patient not taking: Reported on 08/13/2022)     No current facility-administered medications for this visit.     PHYSICAL EXAMINATION:   Vitals:   02/11/23 1501  BP: (!) 156/58  Pulse: 86  Temp: 98.9 F (37.2 C)  SpO2: 96%   Filed Weights   02/11/23 1501  Weight: 137 lb 12.8 oz (62.5 kg)  Positive for bilateral crackles at the bases. Physical Exam HENT:     Head: Normocephalic and atraumatic.     Mouth/Throat:     Pharynx: No oropharyngeal exudate.  Eyes:     Pupils: Pupils are equal, round, and reactive to light.  Cardiovascular:     Rate and Rhythm: Normal rate and regular rhythm.  Pulmonary:     Effort: Pulmonary effort is normal. No respiratory distress.     Breath sounds: Normal breath sounds. No wheezing.  Abdominal:     General: Bowel sounds are normal. There is no distension.     Palpations: Abdomen is soft. There is no mass.     Tenderness: There is no abdominal tenderness. There is no guarding or rebound.  Musculoskeletal:        General: No tenderness. Normal range of motion.     Cervical back: Normal range of motion and neck supple.  Skin:    General: Skin is warm.  Neurological:     Mental Status: She is alert and oriented to person, place, and time.  Psychiatric:        Mood and Affect: Affect normal.    LABORATORY DATA:  I have reviewed the data as listed Lab Results  Component Value Date   WBC 6.3 02/11/2023   HGB 9.8 (L) 02/11/2023   HCT 29.5 (L) 02/11/2023   MCV 112.2 (H)  02/11/2023   PLT 621 (H) 02/11/2023   Recent Labs    08/13/22 1446 11/12/22 1455 02/11/23 1505  NA 137 137 134*  K 4.3 4.0 4.3  CL 101 103 103  CO2 26 26 22   GLUCOSE 111* 119* 135*  BUN 32* 22 30*  CREATININE 1.86* 1.51* 1.45*  CALCIUM 9.3 8.9 9.3  GFRNONAA 27* 34* 36*  PROT 7.5 7.2 6.8  ALBUMIN 4.0 3.7 3.4*  AST 20 21 17   ALT 14 12 12   ALKPHOS 75 73 73  BILITOT 0.2* 0.8 0.4     No results found.  ASSESSMENT & PLAN:   Essential thrombocytosis (HCC) #Essential thrombocytosis-CALR positive; Intermediate risk given her age- on Hydrea once a day.   Worsening renal insufficiency- see below.  # Today platelets 610 hemoglobin 9.8 white count normal at 8.4. not improving- ? Worse; recommend watching closely- if worse- recommend bone marrow biopsy. For now continue hydrea 500 mg one day.   # CKD stage III-IV[GFR-]- on diuretic- slowly worse at GFR  34- defer to PCP/card [Dr.Gollan]; JAN 2024- US kidneys - negative for obstruction. No nephrology evaluation.   # Anemia/ ? Intermittent rectal bleeding-once a month [>> colonoscopy- Dr.Andy Lamb]; Discussed re: repeating colonoscopy- Iron- 26%; ferritin-101. No significant IDA_ hold off GI evaluation  # A.fib- on xarelto 15 mg/day [CKD; Dr.Gollan]; no concerns blood loss. -stable.    # Cough-  worse- with phelgm [on cetrizine]-ADD BNP;  check CXR.   # DISPOSITION:  # ADD BNP today # CXR today.  # follow up in 2  month-MD-labs; cbc/cmp/LDH; iron studies; ferritin; BNP- -Dr.B     Earna Coder, MD 02/11/2023 4:07 PM

## 2023-02-11 NOTE — Progress Notes (Signed)
C/o weakness for a few weeks.

## 2023-02-11 NOTE — Assessment & Plan Note (Addendum)
#  Essential thrombocytosis-CALR positive; Intermediate risk given her age- on Hydrea once a day.   Worsening renal insufficiency- see below.  # Today platelets 610 hemoglobin 9.8 white count normal at 8.4. not improving- ? Worse; recommend watching closely- if worse- recommend bone marrow biopsy. For now continue hydrea 500 mg one day.   # CKD stage III-IV[GFR-]- on diuretic- slowly worse at GFR  34- defer to PCP/card [Dr.Gollan]; JAN 2024- US kidneys - negative for obstruction. No nephrology evaluation.   # Anemia/ ? Intermittent rectal bleeding-once a month [>> colonoscopy- Dr.Andy Lamb]; Discussed re: repeating colonoscopy- Iron- 26%; ferritin-101. No significant IDA_ hold off GI evaluation  # A.fib- on xarelto 15 mg/day [CKD; Dr.Gollan]; no concerns blood loss. -stable.    # Cough-  worse- with phelgm [on cetrizine]-ADD BNP;  check CXR.   # DISPOSITION:  # ADD BNP today # CXR today.  # follow up in 2  month-MD-labs; cbc/cmp/LDH; iron studies; ferritin; BNP- -Dr.B

## 2023-02-14 ENCOUNTER — Ambulatory Visit: Payer: Medicare HMO | Admitting: Internal Medicine

## 2023-02-14 ENCOUNTER — Other Ambulatory Visit: Payer: Medicare HMO

## 2023-03-29 DIAGNOSIS — D0461 Carcinoma in situ of skin of right upper limb, including shoulder: Secondary | ICD-10-CM | POA: Diagnosis not present

## 2023-04-13 ENCOUNTER — Inpatient Hospital Stay: Payer: Medicare HMO | Attending: Internal Medicine

## 2023-04-13 ENCOUNTER — Encounter: Payer: Self-pay | Admitting: Internal Medicine

## 2023-04-13 ENCOUNTER — Inpatient Hospital Stay: Payer: Medicare HMO | Admitting: Internal Medicine

## 2023-04-13 VITALS — BP 144/51 | HR 69 | Temp 97.0°F | Ht 62.0 in | Wt 139.0 lb

## 2023-04-13 DIAGNOSIS — D473 Essential (hemorrhagic) thrombocythemia: Secondary | ICD-10-CM | POA: Diagnosis not present

## 2023-04-13 DIAGNOSIS — I482 Chronic atrial fibrillation, unspecified: Secondary | ICD-10-CM | POA: Diagnosis not present

## 2023-04-13 DIAGNOSIS — D649 Anemia, unspecified: Secondary | ICD-10-CM | POA: Insufficient documentation

## 2023-04-13 DIAGNOSIS — N281 Cyst of kidney, acquired: Secondary | ICD-10-CM | POA: Diagnosis not present

## 2023-04-13 DIAGNOSIS — R140 Abdominal distension (gaseous): Secondary | ICD-10-CM | POA: Insufficient documentation

## 2023-04-13 DIAGNOSIS — Z87891 Personal history of nicotine dependence: Secondary | ICD-10-CM | POA: Insufficient documentation

## 2023-04-13 DIAGNOSIS — N184 Chronic kidney disease, stage 4 (severe): Secondary | ICD-10-CM | POA: Diagnosis not present

## 2023-04-13 DIAGNOSIS — D75839 Thrombocytosis, unspecified: Secondary | ICD-10-CM | POA: Diagnosis not present

## 2023-04-13 DIAGNOSIS — I509 Heart failure, unspecified: Secondary | ICD-10-CM | POA: Insufficient documentation

## 2023-04-13 DIAGNOSIS — N183 Chronic kidney disease, stage 3 unspecified: Secondary | ICD-10-CM | POA: Diagnosis not present

## 2023-04-13 DIAGNOSIS — Z7901 Long term (current) use of anticoagulants: Secondary | ICD-10-CM | POA: Diagnosis not present

## 2023-04-13 LAB — CMP (CANCER CENTER ONLY)
ALT: 13 U/L (ref 0–44)
AST: 18 U/L (ref 15–41)
Albumin: 3.7 g/dL (ref 3.5–5.0)
Alkaline Phosphatase: 59 U/L (ref 38–126)
Anion gap: 9 (ref 5–15)
BUN: 29 mg/dL — ABNORMAL HIGH (ref 8–23)
CO2: 23 mmol/L (ref 22–32)
Calcium: 9.1 mg/dL (ref 8.9–10.3)
Chloride: 103 mmol/L (ref 98–111)
Creatinine: 1.47 mg/dL — ABNORMAL HIGH (ref 0.44–1.00)
GFR, Estimated: 35 mL/min — ABNORMAL LOW (ref >60)
Glucose, Bld: 123 mg/dL — ABNORMAL HIGH (ref 70–99)
Potassium: 4.3 mmol/L (ref 3.5–5.1)
Sodium: 135 mmol/L (ref 135–145)
Total Bilirubin: 0.6 mg/dL (ref 0.3–1.2)
Total Protein: 7 g/dL (ref 6.5–8.1)

## 2023-04-13 LAB — CBC WITH DIFFERENTIAL (CANCER CENTER ONLY)
Abs Immature Granulocytes: 0.08 10*3/uL — ABNORMAL HIGH (ref 0.00–0.07)
Basophils Absolute: 0.1 10*3/uL (ref 0.0–0.1)
Basophils Relative: 1 %
Eosinophils Absolute: 0.2 10*3/uL (ref 0.0–0.5)
Eosinophils Relative: 3 %
HCT: 30.8 % — ABNORMAL LOW (ref 36.0–46.0)
Hemoglobin: 10 g/dL — ABNORMAL LOW (ref 12.0–15.0)
Immature Granulocytes: 1 %
Lymphocytes Relative: 19 %
Lymphs Abs: 1.3 10*3/uL (ref 0.7–4.0)
MCH: 36.6 pg — ABNORMAL HIGH (ref 26.0–34.0)
MCHC: 32.5 g/dL (ref 30.0–36.0)
MCV: 112.8 fL — ABNORMAL HIGH (ref 80.0–100.0)
Monocytes Absolute: 1.1 10*3/uL — ABNORMAL HIGH (ref 0.1–1.0)
Monocytes Relative: 17 %
Neutro Abs: 4 10*3/uL (ref 1.7–7.7)
Neutrophils Relative %: 59 %
Platelet Count: 546 10*3/uL — ABNORMAL HIGH (ref 150–400)
RBC: 2.73 MIL/uL — ABNORMAL LOW (ref 3.87–5.11)
RDW: 15.3 % (ref 11.5–15.5)
WBC Count: 6.8 10*3/uL (ref 4.0–10.5)
nRBC: 0 % (ref 0.0–0.2)

## 2023-04-13 LAB — IRON AND TIBC
Iron: 66 ug/dL (ref 28–170)
Saturation Ratios: 19 % (ref 10.4–31.8)
TIBC: 356 ug/dL (ref 250–450)
UIBC: 290 ug/dL

## 2023-04-13 LAB — FERRITIN: Ferritin: 76 ng/mL (ref 11–307)

## 2023-04-13 LAB — LACTATE DEHYDROGENASE: LDH: 224 U/L — ABNORMAL HIGH (ref 98–192)

## 2023-04-13 LAB — BRAIN NATRIURETIC PEPTIDE: B Natriuretic Peptide: 315.6 pg/mL — ABNORMAL HIGH (ref 0.0–100.0)

## 2023-04-13 NOTE — Assessment & Plan Note (Addendum)
#  Essential thrombocytosis-CALR positive; Intermediate risk given her age- on Hydrea once a day.   # Today platelets 546  hemoglobin 10- white count normal at 8.6 overall stable- if worse- recommend bone marrow biopsy. For now continue hydrea 500 mg one day. Check US abdomen re: abdominal distension- to rule out hepato-splenomegaly.   Discussed with the patient the bone marrow biopsy and aspiration indication and procedure at length.  Given significant discomfort involved-I would recommend under anesthesia/with radiology in the hospital. I discussed the potential complications include-bleeding/trauma and risk of infection; which are fortunately very rare. Will HOLD off  Bone marrow biopsy/aspiration as of now. Will consider in future if clinically worse.    # CKD stage III-IV[GFR-]- on diuretic- slowly worse at GFR  34- defer to PCP/card [Dr.Gollan]; JAN 2024- US kidneys - negative for obstruction. No nephrology evaluation. Stable.   # Anemia/ ? Intermittent rectal bleeding-once a month [>> colonoscopy- Dr.Andy Lamb]; Discussed re: repeating colonoscopy- Iron- 26%; ferritin-101. No significant IDA_ hold off GI evaluation  # A.fib- on xarelto 15 mg/day [CKD; Dr.Gollan]; no concerns blood loss. -stable.    RN will call with results.  # DISPOSITION:  # US abdomen in 1 week # follow up in 2  month-MD-labs; cbc/cmp/LDH- -Dr.B

## 2023-04-13 NOTE — Progress Notes (Signed)
[facility] CONSULT NOTE  Patient Care Team: Kandyce Rud, MD as PCP - General (Family Medicine) Mariah Milling Tollie Pizza, MD as PCP - Cardiology (Cardiology) Antonieta Iba, MD as Consulting Physician (Cardiology) Earna Coder, MD as Consulting Physician (Internal Medicine)  CHIEF COMPLAINTS/PURPOSE OF CONSULTATION: ET  Oncology History Overview Note  # ESSENTIAL THROMBOCYTOSIS Paula Clark 2019- 684; July 2020- platelets-992 ; Hb-12 white count- 9];INTERMEDIATE RISK;July 2020-; CALR MUTATION POSITIVE; July 2020- Hydrea 500 mg/day.   # A.fib on xarelto [A.fib]  # Chronic Kidney disease-III   Essential thrombocytosis (HCC)  01/30/2019 Initial Diagnosis   Essential thrombocytosis (HCC)    HISTORY OF PRESENTING ILLNESS: Alone. Ambulating independently.   Paula Clark 82 y.o.  female pleasant patient with essential thrombocytosis most recently on Hydrea; CKD stage III A-fib on Eliquis, CHF is here for follow-up.  Patient complains of ongoing fatigue. She has chronic diarrhea and hemorrhoids which will intermittently- may be 1 to 2 times a week. Improved with immodium prn.   Complains of abdominal distension.  Patient complains of worsening fatigue. Denies any worsening bleeding. No swelling in legs.  She is current on Lasix as per cardiology.  Review of Systems  Constitutional:  Positive for malaise/fatigue. Negative for chills, diaphoresis, fever and weight loss.  HENT:  Negative for nosebleeds and sore throat.   Eyes:  Negative for double vision.  Respiratory:  Positive for cough and shortness of breath. Negative for hemoptysis, sputum production and wheezing.   Cardiovascular:  Negative for chest pain, palpitations, orthopnea and leg swelling.  Gastrointestinal:  Positive for diarrhea. Negative for abdominal pain, blood in stool, constipation, heartburn, melena, nausea and vomiting.  Genitourinary:  Negative for dysuria, frequency and urgency.  Musculoskeletal:   Positive for joint pain. Negative for back pain.  Skin: Negative.  Negative for itching and rash.  Neurological:  Negative for dizziness, tingling, focal weakness, weakness and headaches.  Endo/Heme/Allergies:  Does not bruise/bleed easily.  Psychiatric/Behavioral:  Negative for depression. The patient is not nervous/anxious and does not have insomnia.      MEDICAL HISTORY:  Past Medical History:  Diagnosis Date   Chronic atrial fibrillation (HCC)    a. Unsuccessful DCCV 05/2016; b. CHADS2VASc => 4 (HTN, age x 2, female)--> Xarelto.   CKD (chronic kidney disease), stage III (HCC)    Epistaxis    Gout    History of stress test    a. 04/2016 MV: EF 80%, no ischemia.   HLD (hyperlipidemia)    Hypertension    Mitral regurgitation    a. Echo 04/2016: EF 60-65%, no RWMA, mild AI, mild MR, mild biatrial enlargement, PASP 32 mmHg, trivial pericardial effusion noted posterior to the heart   Syncope     SURGICAL HISTORY: Past Surgical History:  Procedure Laterality Date   ABDOMINAL HYSTERECTOMY     APPENDECTOMY     ELECTROPHYSIOLOGIC STUDY N/A 05/18/2016   Procedure: CARDIOVERSION;  Surgeon: Antonieta Iba, MD;  Location: [facility] ORS;  Service: Cardiovascular;  Laterality: N/A;   OOPHORECTOMY     TONSILLECTOMY     TOTAL VAGINAL HYSTERECTOMY      SOCIAL HISTORY: Social History   Socioeconomic History   Marital status: Widowed    Spouse name: Not on file   Number of children: Not on file   Years of education: Not on file   Highest education level: Not on file  Occupational History   Not on file  Tobacco Use   Smoking status: Former   Smokeless  tobacco: Never  Vaping Use   Vaping status: Never Used  Substance and Sexual Activity   Alcohol use: No   Drug use: No   Sexual activity: Not on file  Other Topics Concern   Not on file  Social History Narrative   Paula Clark; self; remote smoking; No alcohol; 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    Social Determinants of Health    Financial Resource Strain: Low Risk  (01/27/2023)   Received from [facility]   Overall Financial Resource Strain (CARDIA)    Difficulty of Paying Living Expenses: Not hard at all  Food Insecurity: No Food Insecurity (01/27/2023)   Received from [facility]   Hunger Vital Sign    Worried About Running Out of Food in the Last Year: Never true    Ran Out of Food in the Last Year: Never true  Transportation Needs: No Transportation Needs (01/27/2023)   Received from [facility] - Transportation    In the past 12 months, has lack of transportation kept you from medical appointments or from getting medications?: No    Lack of Transportation (Non-Medical): No  Physical Activity: Not on file  Stress: Not on file  Social Connections: Not on file  Intimate Partner Violence: Not on file    FAMILY HISTORY: Family History  Problem Relation Age of Onset   Heart failure Mother    Atrial fibrillation Brother     ALLERGIES:  is allergic to codeine.  MEDICATIONS:  Current Outpatient Medications  Medication Sig Dispense Refill   acetaminophen (TYLENOL) 500 MG tablet Take 500-1,000 mg by mouth every 6 (six) hours as needed for moderate pain or headache.     cetirizine (ZYRTEC) 10 MG tablet Take 10 mg by mouth daily.     Cholecalciferol (VITAMIN D3) 1000 units CAPS Take 1,000 Units by mouth daily.     diltiazem (CARDIZEM CD) 240 MG 24 hr capsule TAKE 1 CAPSULE BY MOUTH EVERY DAY 90 capsule 2   doxazosin (CARDURA) 8 MG tablet Take 1/2 tab (4 mg) tablet in the AM and 1/2 tab (4 mg) in the evening 90 tablet 3   furosemide (LASIX) 20 MG tablet TAKE 1 TABLET BY MOUTH MON,WED,FRI, AND SAT 60 tablet 1   hydroxyurea (HYDREA) 500 MG capsule Take 500 mg by mouth daily. May take with food to minimize GI side effects.     loperamide (IMODIUM) 2 MG capsule Take 2 mg by mouth as needed for diarrhea or loose stools.     losartan (COZAAR) 25 MG  tablet Take 25 mg by mouth daily.     ondansetron (ZOFRAN) 4 MG tablet Take 1 tablet (4 mg total) by mouth every 8 (eight) hours as needed for nausea or vomiting. 20 tablet 0   potassium chloride (KLOR-CON) 10 MEQ tablet TAKE 1 TABLET BY MOUTH MON,WED,FRI, AND SAT WITH FUROSEMIDE 48 tablet 1   XARELTO 15 MG TABS tablet TAKE 1 TABLET (15 MG TOTAL) BY MOUTH DAILY WITH SUPPER 90 tablet 1   colchicine 0.6 MG tablet  (Patient not taking: Reported on 08/13/2022)     No current facility-administered medications for this visit.     PHYSICAL EXAMINATION:   Vitals:   04/13/23 1524  BP: (!) 144/51  Pulse: 69  Temp: (!) 97 F (36.1 C)  SpO2: 97%    Filed Weights   04/13/23 1524  Weight: 139 lb (63 kg)     Positive for bilateral crackles at  the bases. Physical Exam HENT:     Head: Normocephalic and atraumatic.     Mouth/Throat:     Pharynx: No oropharyngeal exudate.  Eyes:     Pupils: Pupils are equal, round, and reactive to light.  Cardiovascular:     Rate and Rhythm: Normal rate and regular rhythm.  Pulmonary:     Effort: Pulmonary effort is normal. No respiratory distress.     Breath sounds: Normal breath sounds. No wheezing.  Abdominal:     General: Bowel sounds are normal. There is no distension.     Palpations: Abdomen is soft. There is no mass.     Tenderness: There is no abdominal tenderness. There is no guarding or rebound.  Musculoskeletal:        General: No tenderness. Normal range of motion.     Cervical back: Normal range of motion and neck supple.  Skin:    General: Skin is warm.  Neurological:     Mental Status: She is alert and oriented to person, place, and time.  Psychiatric:        Mood and Affect: Affect normal.    LABORATORY DATA:  I have reviewed the data as listed Lab Results  Component Value Date   WBC 6.8 04/13/2023   HGB 10.0 (L) 04/13/2023   HCT 30.8 (L) 04/13/2023   MCV 112.8 (H) 04/13/2023   PLT 546 (H) 04/13/2023   Recent Labs     11/12/22 1455 02/11/23 1505 04/13/23 1523  NA 137 134* 135  K 4.0 4.3 4.3  CL 103 103 103  CO2 26 22 23   GLUCOSE 119* 135* 123*  BUN 22 30* 29*  CREATININE 1.51* 1.45* 1.47*  CALCIUM 8.9 9.3 9.1  GFRNONAA 34* 36* 35*  PROT 7.2 6.8 7.0  ALBUMIN 3.7 3.4* 3.7  AST 21 17 18   ALT 12 12 13   ALKPHOS 73 73 59  BILITOT 0.8 0.4 0.6     No results found.  ASSESSMENT & PLAN:   Essential thrombocytosis (HCC) #Essential thrombocytosis-CALR positive; Intermediate risk given her age- on Hydrea once a day.   # Today platelets 546  hemoglobin 10- white count normal at 8.6 overall stable- if worse- recommend bone marrow biopsy. For now continue hydrea 500 mg one day. Check US abdomen re: abdominal distension- to rule out hepato-splenomegaly.   Discussed with the patient the bone marrow biopsy and aspiration indication and procedure at length.  Given significant discomfort involved-I would recommend under anesthesia/with radiology in the hospital. I discussed the potential complications include-bleeding/trauma and risk of infection; which are fortunately very rare. Will HOLD off  Bone marrow biopsy/aspiration as of now. Will consider in future if clinically worse.    # CKD stage III-IV[GFR-]- on diuretic- slowly worse at GFR  34- defer to PCP/card [Dr.Gollan]; JAN 2024- US kidneys - negative for obstruction. No nephrology evaluation. Stable.   # Anemia/ ? Intermittent rectal bleeding-once a month [>> colonoscopy- Dr.Andy Lamb]; Discussed re: repeating colonoscopy- Iron- 26%; ferritin-101. No significant IDA_ hold off GI evaluation  # A.fib- on xarelto 15 mg/day [CKD; Dr.Gollan]; no concerns blood loss. -stable.    RN will call with results.  # DISPOSITION:  # US abdomen in 1 week # follow up in 2  month-MD-labs; cbc/cmp/LDH- -Dr.B     Earna Coder, MD 04/13/2023 4:11 PM

## 2023-04-13 NOTE — Progress Notes (Signed)
Bruising:no  Bleeding from gums:  no  Diarrhea since chemo, taking Imodium.  No questions pr concerns today.

## 2023-04-20 ENCOUNTER — Ambulatory Visit
Admission: RE | Admit: 2023-04-20 | Discharge: 2023-04-20 | Disposition: A | Discharge status: Home / Self Care | Payer: Medicare HMO | Source: Ambulatory Visit | Attending: Internal Medicine | Admitting: Internal Medicine

## 2023-04-20 DIAGNOSIS — K7460 Unspecified cirrhosis of liver: Secondary | ICD-10-CM | POA: Diagnosis not present

## 2023-04-20 DIAGNOSIS — R140 Abdominal distension (gaseous): Secondary | ICD-10-CM | POA: Diagnosis not present

## 2023-04-20 DIAGNOSIS — R161 Splenomegaly, not elsewhere classified: Secondary | ICD-10-CM | POA: Diagnosis not present

## 2023-04-20 DIAGNOSIS — D473 Essential (hemorrhagic) thrombocythemia: Secondary | ICD-10-CM | POA: Diagnosis not present

## 2023-04-20 DIAGNOSIS — N281 Cyst of kidney, acquired: Secondary | ICD-10-CM | POA: Diagnosis not present

## 2023-04-29 ENCOUNTER — Inpatient Hospital Stay (HOSPITAL_BASED_OUTPATIENT_CLINIC_OR_DEPARTMENT_OTHER): Payer: Medicare HMO | Admitting: Internal Medicine

## 2023-04-29 ENCOUNTER — Encounter: Payer: Self-pay | Admitting: Internal Medicine

## 2023-04-29 ENCOUNTER — Inpatient Hospital Stay: Payer: Medicare HMO

## 2023-04-29 VITALS — BP 110/83 | HR 83 | Temp 98.0°F | Ht 62.0 in | Wt 139.8 lb

## 2023-04-29 DIAGNOSIS — R140 Abdominal distension (gaseous): Secondary | ICD-10-CM | POA: Diagnosis not present

## 2023-04-29 DIAGNOSIS — D473 Essential (hemorrhagic) thrombocythemia: Secondary | ICD-10-CM

## 2023-04-29 DIAGNOSIS — I482 Chronic atrial fibrillation, unspecified: Secondary | ICD-10-CM | POA: Diagnosis not present

## 2023-04-29 DIAGNOSIS — I509 Heart failure, unspecified: Secondary | ICD-10-CM | POA: Diagnosis not present

## 2023-04-29 DIAGNOSIS — N184 Chronic kidney disease, stage 4 (severe): Secondary | ICD-10-CM | POA: Diagnosis not present

## 2023-04-29 DIAGNOSIS — N281 Cyst of kidney, acquired: Secondary | ICD-10-CM | POA: Diagnosis not present

## 2023-04-29 DIAGNOSIS — D75839 Thrombocytosis, unspecified: Secondary | ICD-10-CM | POA: Diagnosis not present

## 2023-04-29 DIAGNOSIS — D649 Anemia, unspecified: Secondary | ICD-10-CM | POA: Diagnosis not present

## 2023-04-29 DIAGNOSIS — N183 Chronic kidney disease, stage 3 unspecified: Secondary | ICD-10-CM | POA: Diagnosis not present

## 2023-04-29 LAB — CBC WITH DIFFERENTIAL (CANCER CENTER ONLY)
Abs Immature Granulocytes: 0.06 10*3/uL (ref 0.00–0.07)
Basophils Absolute: 0 10*3/uL (ref 0.0–0.1)
Basophils Relative: 1 %
Eosinophils Absolute: 0.2 10*3/uL (ref 0.0–0.5)
Eosinophils Relative: 3 %
HCT: 31.1 % — ABNORMAL LOW (ref 36.0–46.0)
Hemoglobin: 10.2 g/dL — ABNORMAL LOW (ref 12.0–15.0)
Immature Granulocytes: 1 %
Lymphocytes Relative: 15 %
Lymphs Abs: 1.1 10*3/uL (ref 0.7–4.0)
MCH: 36.7 pg — ABNORMAL HIGH (ref 26.0–34.0)
MCHC: 32.8 g/dL (ref 30.0–36.0)
MCV: 111.9 fL — ABNORMAL HIGH (ref 80.0–100.0)
Monocytes Absolute: 1.1 10*3/uL — ABNORMAL HIGH (ref 0.1–1.0)
Monocytes Relative: 15 %
Neutro Abs: 4.7 10*3/uL (ref 1.7–7.7)
Neutrophils Relative %: 65 %
Platelet Count: 638 10*3/uL — ABNORMAL HIGH (ref 150–400)
RBC: 2.78 MIL/uL — ABNORMAL LOW (ref 3.87–5.11)
RDW: 15.4 % (ref 11.5–15.5)
WBC Count: 7.1 10*3/uL (ref 4.0–10.5)
nRBC: 0 % (ref 0.0–0.2)

## 2023-04-29 LAB — CMP (CANCER CENTER ONLY)
ALT: 11 U/L (ref 0–44)
AST: 17 U/L (ref 15–41)
Albumin: 4 g/dL (ref 3.5–5.0)
Alkaline Phosphatase: 73 U/L (ref 38–126)
Anion gap: 7 (ref 5–15)
BUN: 20 mg/dL (ref 8–23)
CO2: 25 mmol/L (ref 22–32)
Calcium: 9 mg/dL (ref 8.9–10.3)
Chloride: 106 mmol/L (ref 98–111)
Creatinine: 1.22 mg/dL — ABNORMAL HIGH (ref 0.44–1.00)
GFR, Estimated: 44 mL/min — ABNORMAL LOW (ref >60)
Glucose, Bld: 129 mg/dL — ABNORMAL HIGH (ref 70–99)
Potassium: 4.1 mmol/L (ref 3.5–5.1)
Sodium: 138 mmol/L (ref 135–145)
Total Bilirubin: 0.7 mg/dL (ref 0.3–1.2)
Total Protein: 7.2 g/dL (ref 6.5–8.1)

## 2023-04-29 LAB — LACTATE DEHYDROGENASE: LDH: 274 U/L — ABNORMAL HIGH (ref 98–192)

## 2023-04-29 NOTE — Progress Notes (Signed)
[name] CONSULT NOTE  Patient Care Team: Kandyce Rud, MD as PCP - General (Family Medicine) Mariah Milling Tollie Pizza, MD as PCP - Cardiology (Cardiology) Antonieta Iba, MD as Consulting Physician (Cardiology) Earna Coder, MD as Consulting Physician (Internal Medicine)  CHIEF COMPLAINTS/PURPOSE OF CONSULTATION: ET  Oncology History Overview Note  # ESSENTIAL THROMBOCYTOSIS [name] 2019- 684; July 2020- platelets-992 ; Hb-12 white count- 9];INTERMEDIATE RISK;July 2020-; CALR MUTATION POSITIVE; July 2020- Hydrea 500 mg/day.   # A.fib on xarelto [A.fib]  # Chronic Kidney disease-III   Essential thrombocytosis (HCC)  01/30/2019 Initial Diagnosis   Essential thrombocytosis (HCC)    HISTORY OF PRESENTING ILLNESS: Alone. Ambulating independently.   Paula Clark 82 y.o.  female pleasant patient with essential thrombocytosis most recently on Hydrea; CKD stage III A-fib on Eliquis, CHF is here for follow-up.  In the interim patient underwent ultrasound of the abdomen.  Patient complains of ongoing fatigue. She has chronic diarrhea and hemorrhoids which will intermittently- may be 1 to 2 times a week. Improved with immodium prn.   Patient complains of worsening fatigue. Denies any worsening bleeding. No swelling in legs.  She is current on Lasix as per cardiology.  Review of Systems  Constitutional:  Positive for malaise/fatigue. Negative for chills, diaphoresis, fever and weight loss.  HENT:  Negative for nosebleeds and sore throat.   Eyes:  Negative for double vision.  Respiratory:  Positive for cough and shortness of breath. Negative for hemoptysis, sputum production and wheezing.   Cardiovascular:  Negative for chest pain, palpitations, orthopnea and leg swelling.  Gastrointestinal:  Positive for diarrhea. Negative for abdominal pain, blood in stool, constipation, heartburn, melena, nausea and vomiting.  Genitourinary:  Negative for dysuria, frequency and  urgency.  Musculoskeletal:  Positive for joint pain. Negative for back pain.  Skin: Negative.  Negative for itching and rash.  Neurological:  Negative for dizziness, tingling, focal weakness, weakness and headaches.  Endo/Heme/Allergies:  Does not bruise/bleed easily.  Psychiatric/Behavioral:  Negative for depression. The patient is not nervous/anxious and does not have insomnia.      MEDICAL HISTORY:  Past Medical History:  Diagnosis Date   Chronic atrial fibrillation (HCC)    a. Unsuccessful DCCV 05/2016; b. CHADS2VASc => 4 (HTN, age x 2, female)--> Xarelto.   CKD (chronic kidney disease), stage III (HCC)    Epistaxis    Gout    History of stress test    a. 04/2016 MV: EF 80%, no ischemia.   HLD (hyperlipidemia)    Hypertension    Mitral regurgitation    a. Echo 04/2016: EF 60-65%, no RWMA, mild AI, mild MR, mild biatrial enlargement, PASP 32 mmHg, trivial pericardial effusion noted posterior to the heart   Syncope     SURGICAL HISTORY: Past Surgical History:  Procedure Laterality Date   ABDOMINAL HYSTERECTOMY     APPENDECTOMY     ELECTROPHYSIOLOGIC STUDY N/A 05/18/2016   Procedure: CARDIOVERSION;  Surgeon: Antonieta Iba, MD;  Location: [facility] ORS;  Service: Cardiovascular;  Laterality: N/A;   OOPHORECTOMY     TONSILLECTOMY     TOTAL VAGINAL HYSTERECTOMY      SOCIAL HISTORY: Social History   Socioeconomic History   Marital status: Widowed    Spouse name: Not on file   Number of children: Not on file   Years of education: Not on file   Highest education level: Not on file  Occupational History   Not on file  Tobacco Use   Smoking  status: Former   Smokeless tobacco: Never  Vaping Use   Vaping status: Never Used  Substance and Sexual Activity   Alcohol use: No   Drug use: No   Sexual activity: Not on file  Other Topics Concern   Not on file  Social History Narrative   [name]; self; remote smoking; No alcohol; 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    Social  Determinants of Health   Financial Resource Strain: Low Risk  (01/27/2023)   Received from [facility] : [facility]   Overall Financial Resource Strain (CARDIA)    Difficulty of Paying Living Expenses: Not hard at all  Food Insecurity: No Food Insecurity (01/27/2023)   Received from [facility]   Hunger Vital Sign    Worried About Running Out of Food in the Last Year: Never true    Ran Out of Food in the Last Year: Never true  Transportation Needs: No Transportation Needs (01/27/2023)   Received from [facility] - Transportation    In the past 12 months, has lack of transportation kept you from medical appointments or from getting medications?: No    Lack of Transportation (Non-Medical): No  Physical Activity: Not on file  Stress: Not on file  Social Connections: Not on file  Intimate Partner Violence: Not on file    FAMILY HISTORY: Family History  Problem Relation Age of Onset   Heart failure Mother    Atrial fibrillation Brother     ALLERGIES:  is allergic to codeine.  MEDICATIONS:  Current Outpatient Medications  Medication Sig Dispense Refill   acetaminophen (TYLENOL) 500 MG tablet Take 500-1,000 mg by mouth every 6 (six) hours as needed for moderate pain or headache.     cetirizine (ZYRTEC) 10 MG tablet Take 10 mg by mouth daily.     Cholecalciferol (VITAMIN D3) 1000 units CAPS Take 1,000 Units by mouth daily.     diltiazem (CARDIZEM CD) 240 MG 24 hr capsule TAKE 1 CAPSULE BY MOUTH EVERY DAY 90 capsule 2   doxazosin (CARDURA) 8 MG tablet Take 1/2 tab (4 mg) tablet in the AM and 1/2 tab (4 mg) in the evening 90 tablet 3   furosemide (LASIX) 20 MG tablet TAKE 1 TABLET BY MOUTH MON,WED,FRI, AND SAT 60 tablet 1   hydroxyurea (HYDREA) 500 MG capsule Take 500 mg by mouth daily. May take with food to minimize GI side effects.     loperamide (IMODIUM) 2 MG capsule Take 2 mg by mouth as needed for diarrhea or loose stools.      losartan (COZAAR) 25 MG tablet Take 25 mg by mouth daily.     ondansetron (ZOFRAN) 4 MG tablet Take 1 tablet (4 mg total) by mouth every 8 (eight) hours as needed for nausea or vomiting. 20 tablet 0   potassium chloride (KLOR-CON) 10 MEQ tablet TAKE 1 TABLET BY MOUTH MON,WED,FRI, AND SAT WITH FUROSEMIDE 48 tablet 1   XARELTO 15 MG TABS tablet TAKE 1 TABLET (15 MG TOTAL) BY MOUTH DAILY WITH SUPPER 90 tablet 1   colchicine 0.6 MG tablet  (Patient not taking: Reported on 08/13/2022)     No current facility-administered medications for this visit.     PHYSICAL EXAMINATION:   Vitals:   04/29/23 1438  BP: 110/83  Pulse: 83  Temp: 98 F (36.7 C)  SpO2: 97%    Filed Weights   04/29/23 1438  Weight: 139 lb 12.8 oz (63.4 kg)  Positive for bilateral crackles at the bases. Physical Exam HENT:     Head: Normocephalic and atraumatic.     Mouth/Throat:     Pharynx: No oropharyngeal exudate.  Eyes:     Pupils: Pupils are equal, round, and reactive to light.  Cardiovascular:     Rate and Rhythm: Normal rate and regular rhythm.  Pulmonary:     Effort: Pulmonary effort is normal. No respiratory distress.     Breath sounds: Normal breath sounds. No wheezing.  Abdominal:     General: Bowel sounds are normal. There is no distension.     Palpations: Abdomen is soft. There is no mass.     Tenderness: There is no abdominal tenderness. There is no guarding or rebound.  Musculoskeletal:        General: No tenderness. Normal range of motion.     Cervical back: Normal range of motion and neck supple.  Skin:    General: Skin is warm.  Neurological:     Mental Status: She is alert and oriented to person, place, and time.  Psychiatric:        Mood and Affect: Affect normal.    LABORATORY DATA:  I have reviewed the data as listed Lab Results  Component Value Date   WBC 7.1 04/29/2023   HGB 10.2 (L) 04/29/2023   HCT 31.1 (L) 04/29/2023   MCV 111.9 (H) 04/29/2023   PLT 638 (H) 04/29/2023    Recent Labs    02/11/23 1505 04/13/23 1523 04/29/23 1429  NA 134* 135 138  K 4.3 4.3 4.1  CL 103 103 106  CO2 22 23 25   GLUCOSE 135* 123* 129*  BUN 30* 29* 20  CREATININE 1.45* 1.47* 1.22*  CALCIUM 9.3 9.1 9.0  GFRNONAA 36* 35* 44*  PROT 6.8 7.0 7.2  ALBUMIN 3.4* 3.7 4.0  AST 17 18 17   ALT 12 13 11   ALKPHOS 73 59 73  BILITOT 0.4 0.6 0.7     US Abdomen Complete  Result Date: 04/29/2023 CLINICAL DATA:  Cirrhosis.  Splenomegaly. EXAM: ABDOMEN ULTRASOUND COMPLETE COMPARISON:  Renal ultrasound 07/21/2022 FINDINGS: Gallbladder: No gallstones or wall thickening visualized. No sonographic Murphy sign noted by sonographer. Common bile duct: Diameter: 3.3 mm, within normal limits. Liver: No focal lesion identified. Within normal limits in parenchymal echogenicity. Portal vein is patent on color Doppler imaging with normal direction of blood flow towards the liver. IVC: No abnormality visualized. Pancreas: Visualized portion unremarkable. Spleen: Size and appearance within normal limits. Maximal length is 12.0 cm. Estimated volume is 368 mL Right Kidney: Length: 9.4 cm,. Echogenicity within normal limits. No mass or hydronephrosis visualized. Left Kidney: Length: 9.1 cm. Echogenicity is within normal limits. Simple cyst at the lower pole of the kidney measures 5.1 x 3 x 4.0 cm Abdominal aorta: No aneurysm visualized. Atherosclerotic calcifications are present. Other findings: None. IMPRESSION: 1. Normal size and appearance of the spleen. 2. Normal sonographic appearance of the liver. 3. Benign-appearing left renal cyst. Recommend no imaging follow-up. Electronically Signed   By: Marin Roberts M.D.   On: 04/29/2023 15:25    ASSESSMENT & PLAN:   Essential thrombocytosis (HCC) # 2019- Essential thrombocytosis-CALR positive; Intermediate risk given her age- on Hydrea once a day.   # Today platelets 638  hemoglobin 10- white count normal at 8.6 - WORSENING-see below re: bone marrow  biopsy. For now continue hydrea 500 mg one day.  US abdomen OCt 2024-no hepatosplenomegaly; no ascites.  Benign renal cyst  # #  Anemia/ ? Intermittent rectal bleeding-once a month [>> colonoscopy- Dr.Andy Lamb]; Discussed re: repeating colonoscopy- Iron- 26%; ferritin-101. No significant IDA_ hold off GI evaluation; OCT 2024- 19%; ferritin-41. No IDA.   Discussed with the patient the bone marrow biopsy and aspiration indication and procedure at length.  Given significant discomfort involved-I would recommend under anesthesia/with radiology in the hospital. I discussed the potential complications include-bleeding/trauma and risk of infection; which are fortunately very rare. Proceed with Bone marrow biopsy/aspiration.   # CKD stage III-IV[GFR-]- on diuretic- slowly worse at GFR  34- defer to PCP/card [Dr.Gollan]; JAN 2024- US kidneys - negative for obstruction. No nephrology evaluation. Stable.  # A.fib- on xarelto 15 mg/day [CKD; Dr.Gollan]; no concerns blood loss. -stable.    # DISPOSITION:  # bone marrow in 2 weeks  # follow up in 1 month-MD-labs; cbc/cmp/LDH- -Dr.B     Earna Coder, MD 04/29/2023 3:36 PM

## 2023-04-29 NOTE — Assessment & Plan Note (Addendum)
#   2019- Essential thrombocytosis-CALR positive; Intermediate risk given her age- on Hydrea once a day.   # Today platelets 638  hemoglobin 10- white count normal at 8.6 - WORSENING-see below re: bone marrow biopsy. For now continue hydrea 500 mg one day.  US abdomen OCt 2024-no hepatosplenomegaly; no ascites.  Benign renal cyst  # # Anemia/ ? Intermittent rectal bleeding-once a month [>> colonoscopy- Dr.Andy Lamb]; Discussed re: repeating colonoscopy- Iron- 26%; ferritin-101. No significant IDA_ hold off GI evaluation; OCT 2024- 19%; ferritin-41. No IDA.   Discussed with the patient the bone marrow biopsy and aspiration indication and procedure at length.  Given significant discomfort involved-I would recommend under anesthesia/with radiology in the hospital. I discussed the potential complications include-bleeding/trauma and risk of infection; which are fortunately very rare. Proceed with Bone marrow biopsy/aspiration.   # CKD stage III-IV[GFR-]- on diuretic- slowly worse at GFR  34- defer to PCP/card [Dr.Gollan]; JAN 2024- US kidneys - negative for obstruction. No nephrology evaluation. Stable.  # A.fib- on xarelto 15 mg/day [CKD; Dr.Gollan]; no concerns blood loss. -stable.    # DISPOSITION:  # bone marrow in 2 weeks  # follow up in 1 month-MD-labs; cbc/cmp/LDH- -Dr.B

## 2023-04-29 NOTE — Progress Notes (Signed)
Bruising: no Bleeding from gums: no

## 2023-05-03 ENCOUNTER — Telehealth: Payer: Self-pay | Admitting: *Deleted

## 2023-05-03 NOTE — Telephone Encounter (Signed)
Spoke to patient regarding her Bone Marrow Biopsy; Offered 10/29 arrival time 7 am. Pt cannot go this early as her son has to drop off his child at school in the mornings. Next avail appt with arrival time of 8:30 is Nov 6th . Pt has accepted this appt date. Nothing to eat or drink after midnight. She will report to the [facility] at [facility].Her son will be her driver.

## 2023-05-14 ENCOUNTER — Other Ambulatory Visit: Payer: Self-pay | Admitting: Cardiovascular Disease

## 2023-05-14 ENCOUNTER — Other Ambulatory Visit: Payer: Self-pay | Admitting: Internal Medicine

## 2023-05-14 DIAGNOSIS — I4819 Other persistent atrial fibrillation: Secondary | ICD-10-CM

## 2023-05-16 NOTE — Telephone Encounter (Signed)
Prescription refill request for Xarelto received.  Indication:afib Last office visit:7/24 Weight:63.4  kg Age:82 Scr:1.22  10/24 CrCl:34.36  ml/min  Prescription refilled

## 2023-05-17 ENCOUNTER — Other Ambulatory Visit: Payer: Self-pay

## 2023-05-17 DIAGNOSIS — Z9889 Other specified postprocedural states: Secondary | ICD-10-CM

## 2023-05-17 NOTE — Progress Notes (Signed)
Patient for IR Bone Marrow Biopsy on Wed 05/18/2023, I called and spoke with the patient on the phone and gave pre-procedure instructions. Pt was made aware to be here at 8:30a, NPO after MN prior to procedure as well as driver post procedure/recovery/discharge. Pt stated understanding.  Called 05/17/2023

## 2023-05-18 ENCOUNTER — Ambulatory Visit
Admission: RE | Admit: 2023-05-18 | Discharge: 2023-05-18 | Disposition: A | Discharge status: Home / Self Care | Payer: Medicare HMO | Source: Ambulatory Visit | Attending: Internal Medicine | Admitting: Internal Medicine

## 2023-05-18 ENCOUNTER — Other Ambulatory Visit: Payer: Self-pay

## 2023-05-18 ENCOUNTER — Encounter: Payer: Self-pay | Admitting: Radiology

## 2023-05-18 DIAGNOSIS — I482 Chronic atrial fibrillation, unspecified: Secondary | ICD-10-CM | POA: Insufficient documentation

## 2023-05-18 DIAGNOSIS — I340 Nonrheumatic mitral (valve) insufficiency: Secondary | ICD-10-CM | POA: Diagnosis not present

## 2023-05-18 DIAGNOSIS — I129 Hypertensive chronic kidney disease with stage 1 through stage 4 chronic kidney disease, or unspecified chronic kidney disease: Secondary | ICD-10-CM | POA: Insufficient documentation

## 2023-05-18 DIAGNOSIS — D473 Essential (hemorrhagic) thrombocythemia: Secondary | ICD-10-CM | POA: Diagnosis not present

## 2023-05-18 DIAGNOSIS — N183 Chronic kidney disease, stage 3 unspecified: Secondary | ICD-10-CM | POA: Diagnosis not present

## 2023-05-18 DIAGNOSIS — Z79899 Other long term (current) drug therapy: Secondary | ICD-10-CM | POA: Diagnosis not present

## 2023-05-18 DIAGNOSIS — E785 Hyperlipidemia, unspecified: Secondary | ICD-10-CM | POA: Diagnosis not present

## 2023-05-18 DIAGNOSIS — D539 Nutritional anemia, unspecified: Secondary | ICD-10-CM | POA: Diagnosis not present

## 2023-05-18 DIAGNOSIS — Z1379 Encounter for other screening for genetic and chromosomal anomalies: Secondary | ICD-10-CM | POA: Diagnosis not present

## 2023-05-18 DIAGNOSIS — D75839 Thrombocytosis, unspecified: Secondary | ICD-10-CM | POA: Diagnosis not present

## 2023-05-18 HISTORY — PX: IR BONE MARROW BIOPSY & ASPIRATION: IMG5727

## 2023-05-18 LAB — CBC WITH DIFFERENTIAL/PLATELET
Abs Immature Granulocytes: 0.06 10*3/uL (ref 0.00–0.07)
Basophils Absolute: 0.1 10*3/uL (ref 0.0–0.1)
Basophils Relative: 1 %
Eosinophils Absolute: 0.1 10*3/uL (ref 0.0–0.5)
Eosinophils Relative: 2 %
HCT: 30.7 % — ABNORMAL LOW (ref 36.0–46.0)
Hemoglobin: 11 g/dL — ABNORMAL LOW (ref 12.0–15.0)
Immature Granulocytes: 1 %
Lymphocytes Relative: 16 %
Lymphs Abs: 1.1 10*3/uL (ref 0.7–4.0)
MCH: 40.6 pg — ABNORMAL HIGH (ref 26.0–34.0)
MCHC: 35.8 g/dL (ref 30.0–36.0)
MCV: 113.3 fL — ABNORMAL HIGH (ref 80.0–100.0)
Monocytes Absolute: 1.2 10*3/uL — ABNORMAL HIGH (ref 0.1–1.0)
Monocytes Relative: 18 %
Neutro Abs: 4.2 10*3/uL (ref 1.7–7.7)
Neutrophils Relative %: 62 %
Platelets: 620 10*3/uL — ABNORMAL HIGH (ref 150–400)
RBC: 2.71 MIL/uL — ABNORMAL LOW (ref 3.87–5.11)
RDW: 15 % (ref 11.5–15.5)
Smear Review: NORMAL
WBC: 6.7 10*3/uL (ref 4.0–10.5)
nRBC: 0 % (ref 0.0–0.2)

## 2023-05-18 MED ORDER — LIDOCAINE 1 % OPTIME INJ - NO CHARGE
10.0000 mL | Freq: Once | INTRAMUSCULAR | Status: AC
  Administered 2023-05-18: 10 mL via INTRADERMAL
  Filled 2023-05-18: qty 10

## 2023-05-18 MED ORDER — SODIUM CHLORIDE 0.9 % IV SOLN: INTRAVENOUS | Status: DC

## 2023-05-18 MED ORDER — FENTANYL CITRATE (PF) 100 MCG/2ML IJ SOLN
INTRAMUSCULAR | Status: AC
  Filled 2023-05-18: qty 2

## 2023-05-18 MED ORDER — FENTANYL CITRATE (PF) 100 MCG/2ML IJ SOLN
INTRAMUSCULAR | Status: AC | PRN
  Administered 2023-05-18: 50 ug via INTRAVENOUS

## 2023-05-18 MED ORDER — MIDAZOLAM HCL 2 MG/2ML IJ SOLN
INTRAMUSCULAR | Status: AC | PRN
  Administered 2023-05-18: 1 mg via INTRAVENOUS

## 2023-05-18 MED ORDER — MIDAZOLAM HCL 2 MG/2ML IJ SOLN
INTRAMUSCULAR | Status: AC
  Filled 2023-05-18: qty 2

## 2023-05-18 MED ORDER — HEPARIN SOD (PORK) LOCK FLUSH 100 UNIT/ML IV SOLN
INTRAVENOUS | Status: AC
  Filled 2023-05-18: qty 5

## 2023-05-18 NOTE — Procedures (Signed)
Vascular and Interventional Radiology Procedure Note  Patient: Paula Clark DOB: March 02, 1941 Medical Record Number: [id number] Note Date/Time: 05/18/23 9:40 AM   Performing Physician: Roanna Banning, MD Assistant(s): None  Diagnosis: Thrombocytosis   Procedure: BONE MARROW ASPIRATION and BIOPSY  Anesthesia: Conscious Sedation Complications: None Estimated Blood Loss: Minimal Specimens: Sent for Pathology  Findings:  Successful Fluoroscopy-guided bone marrow aspiration and biopsy A total of 2 cores were obtained. Hemostasis of the tract was achieved using Manual Pressure.  Plan: Bed rest for 1 hours.  See detailed procedure note with images in PACS. The patient tolerated the procedure well without incident or complication and was returned to Recovery in stable condition.    Roanna Banning, MD [facility] Radiology   Pager. [phone] Clinic. [phone]

## 2023-05-18 NOTE — Telephone Encounter (Signed)
CBC with Differential (Cancer Center Only) Order: [id number] Status: Final result     Visible to patient: Yes (not seen)     Next appt: 06/03/2023 at 03:15 PM in Oncology ([facility])     Dx: Essential thrombocytosis (HCC)   0 Result Notes          Component Ref Range & Units 2 wk ago 1 mo ago 3 mo ago 6 mo ago 9 mo ago 10 mo ago 11 mo ago  WBC Count 4.0 - 10.5 K/uL 7.1 6.8 6.3 5.8 6.1 8.4 3.9 Low   RBC 3.87 - 5.11 MIL/uL 2.78 Low  2.73 Low  2.63 Low  2.85 Low  3.35 Low  2.67 Low  2.10 Low   Hemoglobin 12.0 - 15.0 g/dL 24.4 Low  01.0 Low  9.8 Low  10.2 Low  11.1 Low  10.1 Low  7.7 Low   HCT 36.0 - 46.0 % 31.1 Low  30.8 Low  29.5 Low  31.4 Low  33.6 Low  27.9 Low  23.7 Low   MCV 80.0 - 100.0 fL 111.9 High  112.8 High  112.2 High  110.2 High  100.3 High  104.5 High  112.9 High   MCH 26.0 - 34.0 pg 36.7 High  36.6 High  37.3 High  35.8 High  33.1 37.8 High  36.7 High   MCHC 30.0 - 36.0 g/dL 27.2 53.6 64.4 03.4 74.2 36.2 High  32.5  RDW 11.5 - 15.5 % 15.4 15.3 13.5 17.3 High  21.1 High  17.8 High  21.8 High   Platelet Count 150 - 400 K/uL 638 High  546 High  621 High  507 High  507 High  1,229 High Panic  CM 579 High   nRBC 0.0 - 0.2 % 0.0 0.0 0.0 0.0 0.0 0.0 0.0  Neutrophils Relative % % 65 59 62 61 58 60 57  Neutro Abs 1.7 - 7.7 K/uL 4.7 4.0 3.9 3.5 3.6 5.0 2.2  Lymphocytes Relative % 15 19 14 21 20 19 22   Lymphs Abs 0.7 - 4.0 K/uL 1.1 1.3 0.9 1.2 1.2 1.6 0.8  Monocytes Relative % 15 17 18 14 19 16 15   Monocytes Absolute 0.1 - 1.0 K/uL 1.1 High  1.1 High  1.2 High  0.8 1.1 High  1.4 High  0.6  Eosinophils Relative % 3 3 4 2 1 2 1   Eosinophils Absolute 0.0 - 0.5 K/uL 0.2 0.2 0.3 0.1 0.1 0.2 0.0  Basophils Relative % 1 1 1 1 1 1 1   Basophils Absolute 0.0 - 0.1 K/uL 0.0 0.1 0.0 0.0 0.0 0.1 0.0  Immature Granulocytes % 1 1 1 1 1 2 4   Abs Immature Granulocytes 0.00 - 0.07 K/uL 0.06 0.08 High  CM 0.08 High  CM 0.05 CM 0.06 CM 0.18 High  CM 0.15 High  CM  Comment:  Performed at [facility], [address] [place], [place] [address]  Resulting Agency [facility]         Specimen Collected: 04/29/23 14:28 Last Resulted: 04/29/23 14:41      Lab Flowsheet      Order Details      View Encounter      Lab and Collection Details      Routing      Result History    View All Conversations on this Encounter      CM=Additional  comments      Result Care Coordination   Patient Communication   Add Comments   Add Notifications  Back to Top    Other Results from 04/29/2023   Contains abnormal data CMP (Cancer Center only) Order: [id number] Status: Final result      Visible to patient: Yes (not seen)      Next appt: 06/03/2023 at 03:15 PM in Oncology ([facility])      Dx: Essential thrombocytosis (HCC)    0 Result Notes            Component Ref Range & Units 2 wk ago 1 mo ago 3 mo ago 6 mo ago 9 mo ago 10 mo ago 11 mo ago  Sodium 135 - 145 mmol/L 138 135 134 Low  137 137 137 134 Low   Potassium 3.5 - 5.1 mmol/L 4.1 4.3 4.3 4.0 4.3 4.5 4.1  Chloride 98 - 111 mmol/L 106 103 103 103 101 102 103  CO2 22 - 32 mmol/L 25 23 22 26 26 24 23   Glucose, Bld 70 - 99 mg/dL 409 High  811 High  CM 135 High  CM 119 High  CM 111 High  CM 135 High  CM 108 High  CM  Comment: Glucose reference range applies only to samples taken after fasting for at least 8 hours.  BUN 8 - 23 mg/dL 20 29 High  30 High  22 32 High  28 High  30 High   Creatinine 0.44 - 1.00 mg/dL 9.14 High  7.82 High  9.56 High  1.51 High  1.86 High  1.66 High  1.57 High   Calcium 8.9 - 10.3 mg/dL 9.0 9.1 9.3 8.9 9.3 9.4 8.8 Low   Total Protein 6.5 - 8.1 g/dL 7.2 7.0 6.8 7.2 7.5 7.5 6.9  Albumin 3.5 - 5.0 g/dL 4.0 3.7 3.4 Low  3.7 4.0 3.9 3.8  AST 15 - 41 U/L 17 18 17 21 20 24 19   ALT 0 - 44 U/L 11 13 12 12 14 17 11   Alkaline Phosphatase 38 - 126 U/L 73 59 73 73 75 75 61  Total Bilirubin 0.3 -  1.2 mg/dL 0.7 0.6 0.4 0.8 0.2 Low  0.7 1.0  GFR, Estimated >60 mL/min 44 Low  35 Low  CM 36 Low  CM      Comment: (NOTE) Calculated using the CKD-EPI Creatinine Equation (2021)  Anion gap 5 - 15 7 9  CM 9 CM 8 CM 10 CM 11 CM 8 CM  Comment: Performed at [facility], [address] [place], [place] [address]  Resulting Agency [facility] CL[facility] CLIN LAB         Specimen Collected: 04/29/23 14:29 Last Resulted: 04/29/23 15:10

## 2023-05-18 NOTE — Progress Notes (Signed)
Patient clinically stable post IR BMB per Dr Milford Cage, tolerated well. Vitals stable pre and post procedure. Received Versed 1 mg along with Fentanyl 50 mcg IV for procedure. Report given to Weldon Picking RN 15/specials.

## 2023-05-18 NOTE — H&P (Signed)
Chief Complaint: Patient was seen in consultation today for essential thrombocytosis  Referring Physician(s): Earna Coder  Supervising Physician: Roanna Banning  Patient Status: [facility] - Out-pt  History of Present Illness: Paula Clark is a 82 y.o. female with PMH significant for chronic atrial fibrillation, CKD stage III, hyperlipidemia, hypertension, and mitral regurgitation being seen today in relation to essential thrombocytosis. Patient has been under the care of Dr Donneta Romberg from Hematology/Oncology service who has found patient's thrombocytosis to be worsening. Patient is on daily Hydrea. Patient referred to IR for image-guided bone marrow biopsy.   Past Medical History:  Diagnosis Date   Chronic atrial fibrillation (HCC)    a. Unsuccessful DCCV 05/2016; b. CHADS2VASc => 4 (HTN, age x 2, female)--> Xarelto.   CKD (chronic kidney disease), stage III (HCC)    Epistaxis    Gout    History of stress test    a. 04/2016 MV: EF 80%, no ischemia.   HLD (hyperlipidemia)    Hypertension    Mitral regurgitation    a. Echo 04/2016: EF 60-65%, no RWMA, mild AI, mild MR, mild biatrial enlargement, PASP 32 mmHg, trivial pericardial effusion noted posterior to the heart   Syncope     Past Surgical History:  Procedure Laterality Date   ABDOMINAL HYSTERECTOMY     APPENDECTOMY     ELECTROPHYSIOLOGIC STUDY N/A 05/18/2016   Procedure: CARDIOVERSION;  Surgeon: Antonieta Iba, MD;  Location: [facility] ORS;  Service: Cardiovascular;  Laterality: N/A;   OOPHORECTOMY     TONSILLECTOMY     TOTAL VAGINAL HYSTERECTOMY      Allergies: Codeine  Medications: Prior to Admission medications   Medication Sig Start Date End Date Taking? Authorizing Provider  acetaminophen (TYLENOL) 500 MG tablet Take 500-1,000 mg by mouth every 6 (six) hours as needed for moderate pain or headache.    [provider]  cetirizine (ZYRTEC) 10 MG tablet Take 10 mg by mouth daily.    [provider]  Cholecalciferol (VITAMIN D3) 1000 units CAPS Take 1,000 Units by mouth daily.    [provider]  colchicine 0.6 MG tablet  03/21/20   [provider]  diltiazem (CARDIZEM CD) 240 MG 24 hr capsule TAKE 1 CAPSULE BY MOUTH EVERY DAY 01/12/23   Antonieta Iba, MD  doxazosin (CARDURA) 8 MG tablet Take 1/2 tab (4 mg) tablet in the AM and 1/2 tab (4 mg) in the evening 01/10/23   Antonieta Iba, MD  furosemide (LASIX) 20 MG tablet TAKE 1 TABLET BY MOUTH MON,WED,FRI, AND SAT 01/12/23   Antonieta Iba, MD  hydroxyurea (HYDREA) 500 MG capsule Take 500 mg by mouth daily. May take with food to minimize GI side effects.    [provider]  loperamide (IMODIUM) 2 MG capsule Take 2 mg by mouth as needed for diarrhea or loose stools.    [provider]  losartan (COZAAR) 25 MG tablet Take 25 mg by mouth daily. 11/19/21   [provider]  ondansetron (ZOFRAN) 4 MG tablet Take 1 tablet (4 mg total) by mouth every 8 (eight) hours as needed for nausea or vomiting. 05/19/22   Covington, Brand Males, PA-C  potassium chloride (KLOR-CON) 10 MEQ tablet TAKE 1 TABLET BY MOUTH MON,WED,FRI, AND SAT WITH FUROSEMIDE 02/07/23   Antonieta Iba, MD  XARELTO 15 MG TABS tablet TAKE 1 TABLET (15 MG TOTAL) BY MOUTH DAILY WITH SUPPER 05/16/23   Antonieta Iba, MD     Family History  Problem Relation Age of Onset   Heart failure Mother    Atrial fibrillation Brother     Social History   Socioeconomic History   Marital status: Widowed    Spouse name: Not on file   Number of children: Not on file   Years of education: Not on file   Highest education level: Not on file  Occupational History   Not on file  Tobacco Use   Smoking status: Former   Smokeless tobacco: Never  Vaping Use   Vaping status: Never Used  Substance and Sexual Activity   Alcohol use: No   Drug use: No   Sexual activity: Not on file  Other Topics Concern   Not on file  Social History  Narrative   [name]; self; remote smoking; No alcohol; 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    Social Determinants of Health   Financial Resource Strain: Low Risk  (01/27/2023)   Received from [facility]   Overall Financial Resource Strain (CARDIA)    Difficulty of Paying Living Expenses: Not hard at all  Food Insecurity: No Food Insecurity (01/27/2023)   Received from [facility]   Hunger Vital Sign    Worried About Running Out of Food in the Last Year: Never true    Ran Out of Food in the Last Year: Never true  Transportation Needs: No Transportation Needs (01/27/2023)   Received from [facility] - Transportation    In the past 12 months, has lack of transportation kept you from medical appointments or from getting medications?: No    Lack of Transportation (Non-Medical): No  Physical Activity: Not on file  Stress: Not on file  Social Connections: Not on file    Code Status: Full code  Review of Systems: A 12 point ROS discussed and pertinent positives are indicated in the HPI above.  All other systems are negative.  Review of Systems  Constitutional:  Negative for chills and fever.  Respiratory:  Positive for shortness of breath. Negative for chest tightness.   Cardiovascular:  Negative for chest pain and leg swelling.  Gastrointestinal:  Positive for diarrhea. Negative for abdominal pain, nausea and vomiting.  Neurological:  Positive for headaches. Negative for dizziness.  Psychiatric/Behavioral:  Negative for confusion.     Vital Signs: BP (!) 178/69   Pulse 87   Temp 98.4 F (36.9 C) (Oral)   Resp 20   Ht 5' 2.5" (1.588 m)   Wt 139 lb 1.6 oz (63.1 kg)   SpO2 93%   BMI 25.04 kg/m    Physical Exam Vitals reviewed.  Constitutional:      General: She is not in acute distress.    Appearance: She is ill-appearing.  Cardiovascular:     Rate and Rhythm: Normal rate and regular rhythm.     Pulses:  Normal pulses.     Heart sounds: Murmur heard.  Pulmonary:     Effort: Pulmonary effort is normal.     Breath sounds: Normal breath sounds.  Abdominal:     Palpations: Abdomen is soft.     Tenderness: There is no abdominal tenderness.  Musculoskeletal:     Right lower leg: No edema.     Left lower leg: No edema.  Skin:    General: Skin is warm and dry.  Neurological:     Mental Status: She is alert and oriented to person, place, and time.  Psychiatric:  Mood and Affect: Mood normal.        Behavior: Behavior normal.        Thought Content: Thought content normal.        Judgment: Judgment normal.     Imaging: US Abdomen Complete  Result Date: 04/29/2023 CLINICAL DATA:  Cirrhosis.  Splenomegaly. EXAM: ABDOMEN ULTRASOUND COMPLETE COMPARISON:  Renal ultrasound 07/21/2022 FINDINGS: Gallbladder: No gallstones or wall thickening visualized. No sonographic Murphy sign noted by sonographer. Common bile duct: Diameter: 3.3 mm, within normal limits. Liver: No focal lesion identified. Within normal limits in parenchymal echogenicity. Portal vein is patent on color Doppler imaging with normal direction of blood flow towards the liver. IVC: No abnormality visualized. Pancreas: Visualized portion unremarkable. Spleen: Size and appearance within normal limits. Maximal length is 12.0 cm. Estimated volume is 368 mL Right Kidney: Length: 9.4 cm,. Echogenicity within normal limits. No mass or hydronephrosis visualized. Left Kidney: Length: 9.1 cm. Echogenicity is within normal limits. Simple cyst at the lower pole of the kidney measures 5.1 x 3 x 4.0 cm Abdominal aorta: No aneurysm visualized. Atherosclerotic calcifications are present. Other findings: None. IMPRESSION: 1. Normal size and appearance of the spleen. 2. Normal sonographic appearance of the liver. 3. Benign-appearing left renal cyst. Recommend no imaging follow-up. Electronically Signed   By: Marin Roberts M.D.   On: 04/29/2023  15:25    Labs:  CBC: Recent Labs    11/12/22 1455 02/11/23 1505 04/13/23 1523 04/29/23 1428  WBC 5.8 6.3 6.8 7.1  HGB 10.2* 9.8* 10.0* 10.2*  HCT 31.4* 29.5* 30.8* 31.1*  PLT 507* 621* 546* 638*    COAGS: No results for input(s): "INR", "APTT" in the last 8760 hours.  BMP: Recent Labs    11/12/22 1455 02/11/23 1505 04/13/23 1523 04/29/23 1429  NA 137 134* 135 138  K 4.0 4.3 4.3 4.1  CL 103 103 103 106  CO2 26 22 23 25   GLUCOSE 119* 135* 123* 129*  BUN 22 30* 29* 20  CALCIUM 8.9 9.3 9.1 9.0  CREATININE 1.51* 1.45* 1.47* 1.22*  GFRNONAA 34* 36* 35* 44*    LIVER FUNCTION TESTS: Recent Labs    11/12/22 1455 02/11/23 1505 04/13/23 1523 04/29/23 1429  BILITOT 0.8 0.4 0.6 0.7  AST 21 17 18 17   ALT 12 12 13 11   ALKPHOS 73 73 59 73  PROT 7.2 6.8 7.0 7.2  ALBUMIN 3.7 3.4* 3.7 4.0    TUMOR MARKERS: No results for input(s): "AFPTM", "CEA", "CA199", "CHROMGRNA" in the last 8760 hours.  Assessment and Plan:  Paula Clark is an 82 yo female being seen today in relation to essential thrombocytosis. Patient is under the care of Dr Donneta Romberg from Hematology/Oncology service. She has recently been found to have worsening thrombocytosis (platelets of 638k on 04/29/23) and was referred to IR for image-guided bone marrow biopsy. Case was reviewed with Dr Milford Cage and is scheduled to proceed on 05/18/23. Patient is NPO and presents today in her usual state of health.  Risks and benefits of image-guided bone marrow biopsy was discussed with the patient and/or patient's family including, but not limited to bleeding, infection, damage to adjacent structures or low yield requiring additional tests.  All of the questions were answered and there is agreement to proceed.  Consent signed and in chart.   Thank you for this interesting consult.  I greatly enjoyed meeting SICILIA KILLOUGH and look forward to participating in their care.  A copy of this report was sent to the  requesting  provider on this date.  Electronically Signed: Kennieth Francois, PA-C 05/18/2023, 9:05 AM   I spent a total of  15 Minutes   in face to face in clinical consultation, greater than 50% of which was counseling/coordinating care for essential thrombocytosis.

## 2023-05-18 NOTE — Progress Notes (Signed)
Patient has remained stable post BMB, discharge instructions given with questions answered. Denies complaints at this time.

## 2023-05-24 LAB — SURGICAL PATHOLOGY

## 2023-05-27 ENCOUNTER — Encounter (HOSPITAL_COMMUNITY): Payer: Self-pay

## 2023-06-03 ENCOUNTER — Inpatient Hospital Stay (HOSPITAL_BASED_OUTPATIENT_CLINIC_OR_DEPARTMENT_OTHER): Payer: Medicare HMO | Admitting: Internal Medicine

## 2023-06-03 ENCOUNTER — Inpatient Hospital Stay: Payer: Medicare HMO | Attending: Internal Medicine

## 2023-06-03 ENCOUNTER — Encounter: Payer: Self-pay | Admitting: Internal Medicine

## 2023-06-03 VITALS — BP 166/72 | HR 73 | Temp 96.9°F | Ht 62.5 in | Wt 138.2 lb

## 2023-06-03 DIAGNOSIS — D473 Essential (hemorrhagic) thrombocythemia: Secondary | ICD-10-CM | POA: Insufficient documentation

## 2023-06-03 LAB — CBC WITH DIFFERENTIAL (CANCER CENTER ONLY)
Abs Immature Granulocytes: 0.08 10*3/uL — ABNORMAL HIGH (ref 0.00–0.07)
Basophils Absolute: 0.1 10*3/uL (ref 0.0–0.1)
Basophils Relative: 1 %
Eosinophils Absolute: 0.2 10*3/uL (ref 0.0–0.5)
Eosinophils Relative: 3 %
HCT: 32.2 % — ABNORMAL LOW (ref 36.0–46.0)
Hemoglobin: 10.3 g/dL — ABNORMAL LOW (ref 12.0–15.0)
Immature Granulocytes: 1 %
Lymphocytes Relative: 18 %
Lymphs Abs: 1.3 10*3/uL (ref 0.7–4.0)
MCH: 35.3 pg — ABNORMAL HIGH (ref 26.0–34.0)
MCHC: 32 g/dL (ref 30.0–36.0)
MCV: 110.3 fL — ABNORMAL HIGH (ref 80.0–100.0)
Monocytes Absolute: 1.1 10*3/uL — ABNORMAL HIGH (ref 0.1–1.0)
Monocytes Relative: 14 %
Neutro Abs: 4.8 10*3/uL (ref 1.7–7.7)
Neutrophils Relative %: 63 %
Platelet Count: 655 10*3/uL — ABNORMAL HIGH (ref 150–400)
RBC: 2.92 MIL/uL — ABNORMAL LOW (ref 3.87–5.11)
RDW: 14.3 % (ref 11.5–15.5)
WBC Count: 7.5 10*3/uL (ref 4.0–10.5)
nRBC: 0 % (ref 0.0–0.2)

## 2023-06-03 LAB — RETICULOCYTES
Immature Retic Fract: 12.7 % (ref 2.3–15.9)
RBC.: 2.89 MIL/uL — ABNORMAL LOW (ref 3.87–5.11)
Retic Count, Absolute: 39.6 10*3/uL (ref 19.0–186.0)
Retic Ct Pct: 1.4 % (ref 0.4–3.1)

## 2023-06-03 LAB — CMP (CANCER CENTER ONLY)
ALT: 12 U/L (ref 0–44)
AST: 18 U/L (ref 15–41)
Albumin: 3.9 g/dL (ref 3.5–5.0)
Alkaline Phosphatase: 65 U/L (ref 38–126)
Anion gap: 12 (ref 5–15)
BUN: 26 mg/dL — ABNORMAL HIGH (ref 8–23)
CO2: 24 mmol/L (ref 22–32)
Calcium: 9.6 mg/dL (ref 8.9–10.3)
Chloride: 103 mmol/L (ref 98–111)
Creatinine: 1.37 mg/dL — ABNORMAL HIGH (ref 0.44–1.00)
GFR, Estimated: 38 mL/min — ABNORMAL LOW (ref >60)
Glucose, Bld: 118 mg/dL — ABNORMAL HIGH (ref 70–99)
Potassium: 4.2 mmol/L (ref 3.5–5.1)
Sodium: 139 mmol/L (ref 135–145)
Total Bilirubin: 0.5 mg/dL (ref <1.2)
Total Protein: 7.2 g/dL (ref 6.5–8.1)

## 2023-06-03 LAB — LACTATE DEHYDROGENASE: LDH: 247 U/L — ABNORMAL HIGH (ref 98–192)

## 2023-06-03 NOTE — Assessment & Plan Note (Addendum)
#   2019- Essential thrombocytosis-CALR positive; Intermediate risk given her age- on Hydrea once a day; however, given worsening anemia- NOV 2024- BONE MARROW BIOPSY:  the findings are consistent with a myeloproliferative neoplasm, with essential thrombocythemia and prefibrotic primary myelofibrosis being the main diagnostic considerations.  The peripheral smear shows red cell agglutination and causes of red cell agglutination may be considered if  clinically indicated. . For now continue hydrea 500 mg one day.  US abdomen OCt 2024-no hepatosplenomegaly; no ascites.  Benign renal cyst  # # Anemia/ ? Intermittent rectal bleeding-once a month [>> colonoscopy- Dr.Andy Lamb]; Discussed re: repeating colonoscopy- OCT 2024- 19%; ferritin-41. No IDA./ bone marrow- scant iron- #Recommend gentle iron [iron biglycinate; 28 mg ] 1 pill a day.  This pill is unlikely to cause stomach upset or cause constipation.   # CKD stage III-IV[GFR-]- on diuretic- slowly worse at GFR  34- defer to PCP/card [Dr.Gollan]; JAN 2024- US kidneys - negative for obstruction. No nephrology evaluation. Stable.  # A.fib- on xarelto 15 mg/day [CKD; Dr.Gollan]; no concerns blood loss. -stable.    # DISPOSITION:  # follow up in 2  month-MD-labs; cbc/cmp/LDH; iron studies; ferritin- possible venofer-- -Dr.B

## 2023-06-03 NOTE — Progress Notes (Signed)
Bruising: no Bleeding from gums: no   Bone Marrow Biopsy 05/18/23.

## 2023-06-03 NOTE — Patient Instructions (Signed)
#  Recommend gentle iron [iron biglycinate; 28 mg ] 1 pill a day.  This pill is unlikely to cause stomach upset or cause constipation.  

## 2023-06-05 LAB — HAPTOGLOBIN: Haptoglobin: 81 mg/dL (ref 41–333)

## 2023-07-26 ENCOUNTER — Telehealth: Payer: Self-pay | Admitting: Internal Medicine

## 2023-07-26 NOTE — Telephone Encounter (Signed)
 Called patient to reschedule appointment from 1/22- pr Ally new Iron and needs to be before 3:30pm. No answer no voicemail.

## 2023-07-27 ENCOUNTER — Telehealth: Payer: Self-pay | Admitting: Internal Medicine

## 2023-07-27 NOTE — Telephone Encounter (Signed)
 Left voicemail for patient to call back- appointments for 1/22 need to be changed to earlier.

## 2023-08-01 ENCOUNTER — Encounter: Payer: Self-pay | Admitting: Internal Medicine

## 2023-08-01 NOTE — Progress Notes (Signed)
[facility] CONSULT NOTE  Patient Care Team: Kandyce Rud, MD as PCP - General (Family Medicine) Mariah Milling Tollie Pizza, MD as PCP - Cardiology (Cardiology) Antonieta Iba, MD as Consulting Physician (Cardiology) Earna Coder, MD as Consulting Physician (Internal Medicine)  CHIEF COMPLAINTS/PURPOSE OF CONSULTATION: ET  Oncology History Overview Note  # ESSENTIAL THROMBOCYTOSIS Paula Clark 2019- 684; July 2020- platelets-992 ; Hb-12 white count- 9];INTERMEDIATE RISK;July 2020-; CALR MUTATION POSITIVE; July 2020- Hydrea 500 mg/day.   # A.fib on xarelto [A.fib]  # Chronic Kidney disease-III   Essential thrombocytosis (HCC)  01/30/2019 Initial Diagnosis   Essential thrombocytosis (HCC)    HISTORY OF PRESENTING ILLNESS: Alone. Ambulating independently.   Paula Clark 83 y.o.  female pleasant patient with essential thrombocytosis most recently on Hydrea; CKD stage III A-fib on Eliquis, CHF is here for follow-up/and review results of the bone marrow biopsy  Patient complains of ongoing fatigue. She has chronic diarrhea and hemorrhoids which will intermittently- may be 1 to 2 times a week. Improved with immodium prn.   Denies any worsening bleeding. No swelling in legs.  She is current on Lasix as per cardiology.  Review of Systems  Constitutional:  Positive for malaise/fatigue. Negative for chills, diaphoresis, fever and weight loss.  HENT:  Negative for nosebleeds and sore throat.   Eyes:  Negative for double vision.  Respiratory:  Positive for cough and shortness of breath. Negative for hemoptysis, sputum production and wheezing.   Cardiovascular:  Negative for chest pain, palpitations, orthopnea and leg swelling.  Gastrointestinal:  Positive for diarrhea. Negative for abdominal pain, blood in stool, constipation, heartburn, melena, nausea and vomiting.  Genitourinary:  Negative for dysuria, frequency and urgency.  Musculoskeletal:  Positive for joint pain.  Negative for back pain.  Skin: Negative.  Negative for itching and rash.  Neurological:  Negative for dizziness, tingling, focal weakness, weakness and headaches.  Endo/Heme/Allergies:  Does not bruise/bleed easily.  Psychiatric/Behavioral:  Negative for depression. The patient is not nervous/anxious and does not have insomnia.      MEDICAL HISTORY:  Past Medical History:  Diagnosis Date   Chronic atrial fibrillation (HCC)    a. Unsuccessful DCCV 05/2016; b. CHADS2VASc => 4 (HTN, age x 2, female)--> Xarelto.   CKD (chronic kidney disease), stage III (HCC)    Epistaxis    Gout    History of stress test    a. 04/2016 MV: EF 80%, no ischemia.   HLD (hyperlipidemia)    Hypertension    Mitral regurgitation    a. Echo 04/2016: EF 60-65%, no RWMA, mild AI, mild MR, mild biatrial enlargement, PASP 32 mmHg, trivial pericardial effusion noted posterior to the heart   Syncope     SURGICAL HISTORY: Past Surgical History:  Procedure Laterality Date   ABDOMINAL HYSTERECTOMY     APPENDECTOMY     ELECTROPHYSIOLOGIC STUDY N/A 05/18/2016   Procedure: CARDIOVERSION;  Surgeon: Antonieta Iba, MD;  Location: [facility] ORS;  Service: Cardiovascular;  Laterality: N/A;   IR BONE MARROW BIOPSY & ASPIRATION  05/18/2023   OOPHORECTOMY     TONSILLECTOMY     TOTAL VAGINAL HYSTERECTOMY      SOCIAL HISTORY: Social History   Socioeconomic History   Marital status: Widowed    Spouse name: Not on file   Number of children: Not on file   Years of education: Not on file   Highest education level: Not on file  Occupational History   Not on file  Tobacco Use  Smoking status: Former   Smokeless tobacco: Never  Vaping Use   Vaping status: Never Used  Substance and Sexual Activity   Alcohol use: No   Drug use: No   Sexual activity: Not on file  Other Topics Concern   Not on file  Social History Narrative   Paula Clark; self; remote smoking; No alcohol; 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    Social  Drivers of Corporate investment banker Strain: Low Risk  (01/27/2023)   Received from [facility]   Overall Financial Resource Strain (CARDIA)    Difficulty of Paying Living Expenses: Not hard at all  Food Insecurity: No Food Insecurity (01/27/2023)   Received from [facility]   Hunger Vital Sign    Worried About Running Out of Food in the Last Year: Never true    Ran Out of Food in the Last Year: Never true  Transportation Needs: No Transportation Needs (01/27/2023)   Received from [facility]    In the past 12 months, has lack of transportation kept you from medical appointments or from getting medications?: No    Lack of Transportation (Non-Medical): No  Physical Activity: Not on file  Stress: Not on file  Social Connections: Not on file  Intimate Partner Violence: Not on file    FAMILY HISTORY: Family History  Problem Relation Age of Onset   Heart failure Mother    Atrial fibrillation Brother     ALLERGIES:  is allergic to codeine.  MEDICATIONS:  Current Outpatient Medications  Medication Sig Dispense Refill   acetaminophen (TYLENOL) 500 MG tablet Take 500-1,000 mg by mouth every 6 (six) hours as needed for moderate pain or headache.     cetirizine (ZYRTEC) 10 MG tablet Take 10 mg by mouth daily.     Cholecalciferol (VITAMIN D3) 1000 units CAPS Take 1,000 Units by mouth daily.     diltiazem (CARDIZEM CD) 240 MG 24 hr capsule TAKE 1 CAPSULE BY MOUTH EVERY DAY 90 capsule 2   doxazosin (CARDURA) 8 MG tablet Take 1/2 tab (4 mg) tablet in the AM and 1/2 tab (4 mg) in the evening 90 tablet 3   furosemide (LASIX) 20 MG tablet TAKE 1 TABLET BY MOUTH MON,WED,FRI, AND SAT 60 tablet 1   hydroxyurea (HYDREA) 500 MG capsule TAKE 1 CAPSULE BY MOUTH TWICE A DAY 180 capsule 1   loperamide (IMODIUM) 2 MG capsule Take 2 mg by mouth as needed for diarrhea or loose stools.     losartan (COZAAR) 25 MG tablet Take  25 mg by mouth daily.     ondansetron (ZOFRAN) 4 MG tablet Take 1 tablet (4 mg total) by mouth every 8 (eight) hours as needed for nausea or vomiting. 20 tablet 0   potassium chloride (KLOR-CON) 10 MEQ tablet TAKE 1 TABLET BY MOUTH MON,WED,FRI, AND SAT WITH FUROSEMIDE 48 tablet 1   XARELTO 15 MG TABS tablet TAKE 1 TABLET (15 MG TOTAL) BY MOUTH DAILY WITH SUPPER 90 tablet 1   colchicine 0.6 MG tablet  (Patient not taking: Reported on 08/13/2022)     No current facility-administered medications for this visit.     PHYSICAL EXAMINATION:   Vitals:   06/03/23 1520 06/03/23 1530  BP: (!) 175/78 (!) 166/72  Pulse: 73   Temp: (!) 96.9 F (36.1 C)   SpO2: 97%     Filed Weights   06/03/23 1520  Weight: 138 lb 3.2 oz (62.7 kg)  Positive for bilateral crackles at the bases. Physical Exam HENT:     Head: Normocephalic and atraumatic.     Mouth/Throat:     Pharynx: No oropharyngeal exudate.  Eyes:     Pupils: Pupils are equal, round, and reactive to light.  Cardiovascular:     Rate and Rhythm: Normal rate and regular rhythm.  Pulmonary:     Effort: Pulmonary effort is normal. No respiratory distress.     Breath sounds: Normal breath sounds. No wheezing.  Abdominal:     General: Bowel sounds are normal. There is no distension.     Palpations: Abdomen is soft. There is no mass.     Tenderness: There is no abdominal tenderness. There is no guarding or rebound.  Musculoskeletal:        General: No tenderness. Normal range of motion.     Cervical back: Normal range of motion and neck supple.  Skin:    General: Skin is warm.  Neurological:     Mental Status: She is alert and oriented to person, place, and time.  Psychiatric:        Mood and Affect: Affect normal.    LABORATORY DATA:  I have reviewed the data as listed Lab Results  Component Value Date   WBC 7.5 06/03/2023   HGB 10.3 (L) 06/03/2023   HCT 32.2 (L) 06/03/2023   MCV 110.3 (H) 06/03/2023   PLT 655 (H)  06/03/2023   Recent Labs    04/13/23 1523 04/29/23 1429 06/03/23 1507  NA 135 138 139  K 4.3 4.1 4.2  CL 103 106 103  CO2 23 25 24   GLUCOSE 123* 129* 118*  BUN 29* 20 26*  CREATININE 1.47* 1.22* 1.37*  CALCIUM 9.1 9.0 9.6  GFRNONAA 35* 44* 38*  PROT 7.0 7.2 7.2  ALBUMIN 3.7 4.0 3.9  AST 18 17 18   ALT 13 11 12   ALKPHOS 59 73 65  BILITOT 0.6 0.7 0.5     No results found.  ASSESSMENT & PLAN:   Essential thrombocytosis (HCC) # 2019- Essential thrombocytosis-CALR positive; Intermediate risk given her age- on Hydrea once a day; however, given worsening anemia- NOV 2024- BONE MARROW BIOPSY:  the findings are consistent with a myeloproliferative neoplasm, with essential thrombocythemia and prefibrotic primary myelofibrosis being the main diagnostic considerations.  The peripheral smear shows red cell agglutination and causes of red cell agglutination may be considered if  clinically indicated. . For now continue hydrea 500 mg one day.  US abdomen OCt 2024-no hepatosplenomegaly; no ascites.  Benign renal cyst  # # Anemia/ ? Intermittent rectal bleeding-once a month [>> colonoscopy- Dr.Andy Lamb]; Discussed re: repeating colonoscopy- OCT 2024- 19%; ferritin-41. No IDA./ bone marrow- scant iron- #Recommend gentle iron [iron biglycinate; 28 mg ] 1 pill a day.  This pill is unlikely to cause stomach upset or cause constipation.   # CKD stage III-IV[GFR-]- on diuretic- slowly worse at GFR  34- defer to PCP/card [Dr.Gollan]; JAN 2024- US kidneys - negative for obstruction. No nephrology evaluation. Stable.  # A.fib- on xarelto 15 mg/day [CKD; Dr.Gollan]; no concerns blood loss. -stable.    # DISPOSITION:  # follow up in 2  month-MD-labs; cbc/cmp/LDH; iron studies; ferritin- possible venofer-- -Dr.B     Earna Coder, MD 08/01/2023 1:47 PM

## 2023-08-03 ENCOUNTER — Inpatient Hospital Stay: Payer: Medicare HMO | Admitting: Internal Medicine

## 2023-08-03 ENCOUNTER — Inpatient Hospital Stay: Payer: Medicare HMO

## 2023-08-07 ENCOUNTER — Other Ambulatory Visit: Payer: Self-pay | Admitting: Cardiovascular Disease

## 2023-08-12 ENCOUNTER — Encounter: Payer: Self-pay | Admitting: Internal Medicine

## 2023-08-12 ENCOUNTER — Inpatient Hospital Stay: Payer: Medicare HMO

## 2023-08-12 ENCOUNTER — Inpatient Hospital Stay (HOSPITAL_BASED_OUTPATIENT_CLINIC_OR_DEPARTMENT_OTHER): Payer: Medicare HMO | Admitting: Internal Medicine

## 2023-08-12 ENCOUNTER — Inpatient Hospital Stay: Payer: Medicare HMO | Attending: Internal Medicine

## 2023-08-12 VITALS — BP 191/71 | HR 50

## 2023-08-12 DIAGNOSIS — N184 Chronic kidney disease, stage 4 (severe): Secondary | ICD-10-CM | POA: Insufficient documentation

## 2023-08-12 DIAGNOSIS — D649 Anemia, unspecified: Secondary | ICD-10-CM

## 2023-08-12 DIAGNOSIS — D473 Essential (hemorrhagic) thrombocythemia: Secondary | ICD-10-CM | POA: Diagnosis not present

## 2023-08-12 DIAGNOSIS — Z87891 Personal history of nicotine dependence: Secondary | ICD-10-CM | POA: Insufficient documentation

## 2023-08-12 DIAGNOSIS — D75839 Thrombocytosis, unspecified: Secondary | ICD-10-CM | POA: Diagnosis not present

## 2023-08-12 DIAGNOSIS — I4891 Unspecified atrial fibrillation: Secondary | ICD-10-CM | POA: Diagnosis not present

## 2023-08-12 DIAGNOSIS — Z7901 Long term (current) use of anticoagulants: Secondary | ICD-10-CM | POA: Insufficient documentation

## 2023-08-12 LAB — CBC WITH DIFFERENTIAL (CANCER CENTER ONLY)
Abs Immature Granulocytes: 0.11 10*3/uL — ABNORMAL HIGH (ref 0.00–0.07)
Basophils Absolute: 0.1 10*3/uL (ref 0.0–0.1)
Basophils Relative: 1 %
Eosinophils Absolute: 0.2 10*3/uL (ref 0.0–0.5)
Eosinophils Relative: 2 %
HCT: 30.2 % — ABNORMAL LOW (ref 36.0–46.0)
Hemoglobin: 9.9 g/dL — ABNORMAL LOW (ref 12.0–15.0)
Immature Granulocytes: 1 %
Lymphocytes Relative: 17 %
Lymphs Abs: 1.5 10*3/uL (ref 0.7–4.0)
MCH: 35.6 pg — ABNORMAL HIGH (ref 26.0–34.0)
MCHC: 32.8 g/dL (ref 30.0–36.0)
MCV: 108.6 fL — ABNORMAL HIGH (ref 80.0–100.0)
Monocytes Absolute: 1.5 10*3/uL — ABNORMAL HIGH (ref 0.1–1.0)
Monocytes Relative: 16 %
Neutro Abs: 5.6 10*3/uL (ref 1.7–7.7)
Neutrophils Relative %: 63 %
Platelet Count: 775 10*3/uL — ABNORMAL HIGH (ref 150–400)
RBC: 2.78 MIL/uL — ABNORMAL LOW (ref 3.87–5.11)
RDW: 14.5 % (ref 11.5–15.5)
WBC Count: 9 10*3/uL (ref 4.0–10.5)
nRBC: 0 % (ref 0.0–0.2)

## 2023-08-12 LAB — CMP (CANCER CENTER ONLY)
ALT: 14 U/L (ref 0–44)
AST: 18 U/L (ref 15–41)
Albumin: 3.8 g/dL (ref 3.5–5.0)
Alkaline Phosphatase: 63 U/L (ref 38–126)
Anion gap: 8 (ref 5–15)
BUN: 25 mg/dL — ABNORMAL HIGH (ref 8–23)
CO2: 25 mmol/L (ref 22–32)
Calcium: 9.4 mg/dL (ref 8.9–10.3)
Chloride: 104 mmol/L (ref 98–111)
Creatinine: 1.33 mg/dL — ABNORMAL HIGH (ref 0.44–1.00)
GFR, Estimated: 39 mL/min — ABNORMAL LOW (ref >60)
Glucose, Bld: 110 mg/dL — ABNORMAL HIGH (ref 70–99)
Potassium: 4.1 mmol/L (ref 3.5–5.1)
Sodium: 137 mmol/L (ref 135–145)
Total Bilirubin: 0.8 mg/dL (ref 0.0–1.2)
Total Protein: 7.1 g/dL (ref 6.5–8.1)

## 2023-08-12 LAB — FERRITIN: Ferritin: 61 ng/mL (ref 11–307)

## 2023-08-12 LAB — IRON AND TIBC
Iron: 63 ug/dL (ref 28–170)
Saturation Ratios: 17 % (ref 10.4–31.8)
TIBC: 367 ug/dL (ref 250–450)
UIBC: 304 ug/dL

## 2023-08-12 LAB — LACTATE DEHYDROGENASE: LDH: 331 U/L — ABNORMAL HIGH (ref 98–192)

## 2023-08-12 MED ORDER — SODIUM CHLORIDE 0.9% FLUSH
10.0000 mL | Freq: Once | INTRAVENOUS | Status: AC | PRN
  Administered 2023-08-12: 10 mL
  Filled 2023-08-12: qty 10

## 2023-08-12 MED ORDER — IRON SUCROSE 20 MG/ML IV SOLN
200.0000 mg | Freq: Once | INTRAVENOUS | Status: AC
  Administered 2023-08-12: 200 mg via INTRAVENOUS
  Filled 2023-08-12: qty 10

## 2023-08-12 NOTE — Assessment & Plan Note (Addendum)
#   2019- Essential thrombocytosis-CALR positive; Intermediate risk given her age- on Hydrea once a day; however, given worsening anemia- NOV 2024- BONE MARROW BIOPSY:  the findings are consistent with a myeloproliferative neoplasm, with essential thrombocythemia and prefibrotic primary myelofibrosis being the main diagnostic considerations.  The peripheral smear shows red cell agglutination and causes of red cell agglutination may be considered if  clinically indicated- however, Haptoglobin- NOV 2024- 81/WNL.   #  For now continue hydrea 500 mg one day.  US abdomen OCt 2024-no hepatosplenomegaly; no ascites.  Benign renal cyst  # # Anemia/ ? Intermittent rectal bleeding-once a month [>> colonoscopy- Dr.Andy Lamb]; Discussed re: repeating colonoscopy- OCT 2024- 19%; ferritin-41. No IDA./ bone marrow- scant iron- continue  gentle iron1 pill a day.  Also discussed regarding addition of erythropoietin agent along with iron infusions. Proceed with IV venofer today.  Consider retacrit at next visit.   # CKD stage III-IV[GFR-]- on diuretic- slowly worse at GFR  34- defer to PCP/card [Dr.Gollan]; JAN 2024- US kidneys - negative for obstruction. No nephrology evaluation. Stable.  # A.fib- on xarelto 15 mg/day [CKD; Dr.Gollan]; no concerns blood loss. -stable.    # DISPOSITION:  # venofer today # follow up in 1  month-MD-labs; cbc/cmp/LDH; possible venofer or retacrit-- -Dr.B

## 2023-08-12 NOTE — Progress Notes (Signed)
 Patient is doing well, no new questions or concerns for the doctor today.

## 2023-08-12 NOTE — Progress Notes (Signed)
[facility] CONSULT NOTE  Patient Care Team: Kandyce Rud, MD as PCP - General (Family Medicine) Mariah Milling Tollie Pizza, MD as PCP - Cardiology (Cardiology) Antonieta Iba, MD as Consulting Physician (Cardiology) Earna Coder, MD as Consulting Physician (Internal Medicine)  CHIEF COMPLAINTS/PURPOSE OF CONSULTATION: ET  Oncology History Overview Note  # ESSENTIAL THROMBOCYTOSIS [name] 2019- 684; July 2020- platelets-992 ; Hb-12 white count- 9];INTERMEDIATE RISK;July 2020-; CALR MUTATION POSITIVE; July 2020- Hydrea 500 mg/day.   # A.fib on xarelto [A.fib]  # Chronic Kidney disease-III   Essential thrombocytosis (HCC)  01/30/2019 Initial Diagnosis   Essential thrombocytosis (HCC)    HISTORY OF PRESENTING ILLNESS: Alone. Ambulating independently.   Paula Clark 83 y.o.  female pleasant patient with  myeloproliferative neoplasm, with essential thrombocythemia and prefibrotic primary myelofibrosis most recently on Hydrea; CKD stage III A-fib on Eliquis, CHF is here for follow-up.  Patient is doing well, no new questions or concerns. However, Patient complains of ongoing fatigue.   She has chronic diarrhea and hemorrhoids which will intermittently- may be 1 to 2 times a week. Improved with immodium prn.   Denies any worsening bleeding. No swelling in legs.  She is current on Lasix as per cardiology.  Review of Systems  Constitutional:  Positive for malaise/fatigue. Negative for chills, diaphoresis, fever and weight loss.  HENT:  Negative for nosebleeds and sore throat.   Eyes:  Negative for double vision.  Respiratory:  Positive for cough and shortness of breath. Negative for hemoptysis, sputum production and wheezing.   Cardiovascular:  Negative for chest pain, palpitations, orthopnea and leg swelling.  Gastrointestinal:  Positive for diarrhea. Negative for abdominal pain, blood in stool, constipation, heartburn, melena, nausea and vomiting.  Genitourinary:   Negative for dysuria, frequency and urgency.  Musculoskeletal:  Positive for joint pain. Negative for back pain.  Skin: Negative.  Negative for itching and rash.  Neurological:  Negative for dizziness, tingling, focal weakness, weakness and headaches.  Endo/Heme/Allergies:  Does not bruise/bleed easily.  Psychiatric/Behavioral:  Negative for depression. The patient is not nervous/anxious and does not have insomnia.      MEDICAL HISTORY:  Past Medical History:  Diagnosis Date   Chronic atrial fibrillation (HCC)    a. Unsuccessful DCCV 05/2016; b. CHADS2VASc => 4 (HTN, age x 2, female)--> Xarelto.   CKD (chronic kidney disease), stage III (HCC)    Epistaxis    Gout    History of stress test    a. 04/2016 MV: EF 80%, no ischemia.   HLD (hyperlipidemia)    Hypertension    Mitral regurgitation    a. Echo 04/2016: EF 60-65%, no RWMA, mild AI, mild MR, mild biatrial enlargement, PASP 32 mmHg, trivial pericardial effusion noted posterior to the heart   Syncope     SURGICAL HISTORY: Past Surgical History:  Procedure Laterality Date   ABDOMINAL HYSTERECTOMY     APPENDECTOMY     ELECTROPHYSIOLOGIC STUDY N/A 05/18/2016   Procedure: CARDIOVERSION;  Surgeon: Antonieta Iba, MD;  Location: [facility] ORS;  Service: Cardiovascular;  Laterality: N/A;   IR BONE MARROW BIOPSY & ASPIRATION  05/18/2023   OOPHORECTOMY     TONSILLECTOMY     TOTAL VAGINAL HYSTERECTOMY      SOCIAL HISTORY: Social History   Socioeconomic History   Marital status: Widowed    Spouse name: Not on file   Number of children: Not on file   Years of education: Not on file   Highest education level: Not on  file  Occupational History   Not on file  Tobacco Use   Smoking status: Former   Smokeless tobacco: Never  Vaping Use   Vaping status: Never Used  Substance and Sexual Activity   Alcohol use: No   Drug use: No   Sexual activity: Not on file  Other Topics Concern   Not on file  Social History Narrative    [name]; self; remote smoking; No alcohol; 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    Social Drivers of Corporate investment banker Strain: Low Risk  (01/27/2023)   Received from [facility]   Overall Financial Resource Strain (CARDIA)    Difficulty of Paying Living Expenses: Not hard at all  Food Insecurity: No Food Insecurity (01/27/2023)   Received [facility]   Hunger Vital Sign    Worried About Running Out of Food in the Last Year: Never true    Ran Out of Food in the Last Year: Never true  Transportation Needs: No Transportation Needs (01/27/2023)   Received from [facility] - Transportation    In the past 12 months, has lack of transportation kept you from medical appointments or from getting medications?: No    Lack of Transportation (Non-Medical): No  Physical Activity: Not on file  Stress: Not on file  Social Connections: Not on file  Intimate Partner Violence: Not on file    FAMILY HISTORY: Family History  Problem Relation Age of Onset   Heart failure Mother    Atrial fibrillation Brother     ALLERGIES:  is allergic to codeine.  MEDICATIONS:  Current Outpatient Medications  Medication Sig Dispense Refill   acetaminophen (TYLENOL) 500 MG tablet Take 500-1,000 mg by mouth every 6 (six) hours as needed for moderate pain or headache.     cetirizine (ZYRTEC) 10 MG tablet Take 10 mg by mouth daily.     Cholecalciferol (VITAMIN D3) 1000 units CAPS Take 1,000 Units by mouth daily.     colchicine 0.6 MG tablet  (Patient not taking: Reported on 08/13/2022)     diltiazem (CARDIZEM CD) 240 MG 24 hr capsule TAKE 1 CAPSULE BY MOUTH EVERY DAY 90 capsule 2   doxazosin (CARDURA) 8 MG tablet Take 1/2 tab (4 mg) tablet in the AM and 1/2 tab (4 mg) in the evening 90 tablet 3   furosemide (LASIX) 20 MG tablet TAKE 1 TABLET BY MOUTH MON,WED,FRI, AND SAT 60 tablet 1   hydroxyurea (HYDREA) 500 MG capsule TAKE 1 CAPSULE BY MOUTH  TWICE A DAY 180 capsule 1   loperamide (IMODIUM) 2 MG capsule Take 2 mg by mouth as needed for diarrhea or loose stools.     losartan (COZAAR) 25 MG tablet Take 25 mg by mouth daily.     ondansetron (ZOFRAN) 4 MG tablet Take 1 tablet (4 mg total) by mouth every 8 (eight) hours as needed for nausea or vomiting. 20 tablet 0   potassium chloride (KLOR-CON) 10 MEQ tablet TAKE 1 TABLET BY MOUTH MON,WED,FRI, AND SAT WITH FUROSEMIDE 48 tablet 0   XARELTO 15 MG TABS tablet TAKE 1 TABLET (15 MG TOTAL) BY MOUTH DAILY WITH SUPPER 90 tablet 1   No current facility-administered medications for this visit.     PHYSICAL EXAMINATION:   Vitals:   08/12/23 1416 08/12/23 1422  BP: (!) 180/60 (!) 142/70  Pulse: 80   Resp: 18   Temp: (!) 97.2 F (36.2 C)   SpO2: 97%  Filed Weights   08/12/23 1416  Weight: 138 lb (62.6 kg)      Positive for bilateral crackles at the bases. Physical Exam HENT:     Head: Normocephalic and atraumatic.     Mouth/Throat:     Pharynx: No oropharyngeal exudate.  Eyes:     Pupils: Pupils are equal, round, and reactive to light.  Cardiovascular:     Rate and Rhythm: Normal rate and regular rhythm.  Pulmonary:     Effort: Pulmonary effort is normal. No respiratory distress.     Breath sounds: Normal breath sounds. No wheezing.  Abdominal:     General: Bowel sounds are normal. There is no distension.     Palpations: Abdomen is soft. There is no mass.     Tenderness: There is no abdominal tenderness. There is no guarding or rebound.  Musculoskeletal:        General: No tenderness. Normal range of motion.     Cervical back: Normal range of motion and neck supple.  Skin:    General: Skin is warm.  Neurological:     Mental Status: She is alert and oriented to person, place, and time.  Psychiatric:        Mood and Affect: Affect normal.    LABORATORY DATA:  I have reviewed the data as listed Lab Results  Component Value Date   WBC 9.0 08/12/2023   HGB  9.9 (L) 08/12/2023   HCT 30.2 (L) 08/12/2023   MCV 108.6 (H) 08/12/2023   PLT 775 (H) 08/12/2023   Recent Labs    04/29/23 1429 06/03/23 1507 08/12/23 1359  NA 138 139 137  K 4.1 4.2 4.1  CL 106 103 104  CO2 25 24 25   GLUCOSE 129* 118* 110*  BUN 20 26* 25*  CREATININE 1.22* 1.37* 1.33*  CALCIUM 9.0 9.6 9.4  GFRNONAA 44* 38* 39*  PROT 7.2 7.2 7.1  ALBUMIN 4.0 3.9 3.8  AST 17 18 18   ALT 11 12 14   ALKPHOS 73 65 63  BILITOT 0.7 0.5 0.8     No results found.  ASSESSMENT & PLAN:   Essential thrombocytosis (HCC) # 2019- Essential thrombocytosis-CALR positive; Intermediate risk given her age- on Hydrea once a day; however, given worsening anemia- NOV 2024- BONE MARROW BIOPSY:  the findings are consistent with a myeloproliferative neoplasm, with essential thrombocythemia and prefibrotic primary myelofibrosis being the main diagnostic considerations.  The peripheral smear shows red cell agglutination and causes of red cell agglutination may be considered if  clinically indicated- however, Haptoglobin- NOV 2024- 81/WNL.   #  For now continue hydrea 500 mg one day.  US abdomen OCt 2024-no hepatosplenomegaly; no ascites.  Benign renal cyst  # # Anemia/ ? Intermittent rectal bleeding-once a month [>> colonoscopy- Dr.Andy Lamb]; Discussed re: repeating colonoscopy- OCT 2024- 19%; ferritin-41. No IDA./ bone marrow- scant iron- continue  gentle iron1 pill a day.  Also discussed regarding addition of erythropoietin agent along with iron infusions. Proceed with IV venofer today.  Consider retacrit at next visit.   # CKD stage III-IV[GFR-]- on diuretic- slowly worse at GFR  34- defer to PCP/card [Dr.Gollan]; JAN 2024- US kidneys - negative for obstruction. No nephrology evaluation. Stable.  # A.fib- on xarelto 15 mg/day [CKD; Dr.Gollan]; no concerns blood loss. -stable.    # DISPOSITION:  # venofer today # follow up in 1  month-MD-labs; cbc/cmp/LDH; possible venofer or retacrit-- -Dr.B       Earna Coder, MD 08/12/2023 11:19 PM

## 2023-08-12 NOTE — Patient Instructions (Signed)

## 2023-08-19 ENCOUNTER — Other Ambulatory Visit: Payer: Self-pay | Admitting: Cardiovascular Disease

## 2023-08-19 DIAGNOSIS — I4819 Other persistent atrial fibrillation: Secondary | ICD-10-CM

## 2023-08-19 NOTE — Telephone Encounter (Signed)
 Alternative request: Xarelto  not covered

## 2023-08-22 DIAGNOSIS — N1832 Chronic kidney disease, stage 3b: Secondary | ICD-10-CM | POA: Diagnosis not present

## 2023-08-22 DIAGNOSIS — I10 Essential (primary) hypertension: Secondary | ICD-10-CM | POA: Diagnosis not present

## 2023-08-22 NOTE — Telephone Encounter (Signed)
 Prescription refill request for Xarelto  received.  Indication:afib Last office visit:7/24 Weight:62.6  kg Age:83 Scr:1.33  1/25 CrCl:31.12  ml/min  Prescription refilled

## 2023-08-26 DIAGNOSIS — D2261 Melanocytic nevi of right upper limb, including shoulder: Secondary | ICD-10-CM | POA: Diagnosis not present

## 2023-08-26 DIAGNOSIS — D485 Neoplasm of uncertain behavior of skin: Secondary | ICD-10-CM | POA: Diagnosis not present

## 2023-08-26 DIAGNOSIS — D225 Melanocytic nevi of trunk: Secondary | ICD-10-CM | POA: Diagnosis not present

## 2023-08-26 DIAGNOSIS — Z85820 Personal history of malignant melanoma of skin: Secondary | ICD-10-CM | POA: Diagnosis not present

## 2023-08-26 DIAGNOSIS — D0462 Carcinoma in situ of skin of left upper limb, including shoulder: Secondary | ICD-10-CM | POA: Diagnosis not present

## 2023-08-26 DIAGNOSIS — C44619 Basal cell carcinoma of skin of left upper limb, including shoulder: Secondary | ICD-10-CM | POA: Diagnosis not present

## 2023-08-26 DIAGNOSIS — D0461 Carcinoma in situ of skin of right upper limb, including shoulder: Secondary | ICD-10-CM | POA: Diagnosis not present

## 2023-08-26 DIAGNOSIS — D2262 Melanocytic nevi of left upper limb, including shoulder: Secondary | ICD-10-CM | POA: Diagnosis not present

## 2023-08-26 DIAGNOSIS — D2272 Melanocytic nevi of left lower limb, including hip: Secondary | ICD-10-CM | POA: Diagnosis not present

## 2023-08-26 DIAGNOSIS — L570 Actinic keratosis: Secondary | ICD-10-CM | POA: Diagnosis not present

## 2023-08-26 DIAGNOSIS — Z85828 Personal history of other malignant neoplasm of skin: Secondary | ICD-10-CM | POA: Diagnosis not present

## 2023-08-29 DIAGNOSIS — D75839 Thrombocytosis, unspecified: Secondary | ICD-10-CM | POA: Diagnosis not present

## 2023-08-29 DIAGNOSIS — Z79899 Other long term (current) drug therapy: Secondary | ICD-10-CM | POA: Diagnosis not present

## 2023-08-29 DIAGNOSIS — N189 Chronic kidney disease, unspecified: Secondary | ICD-10-CM | POA: Diagnosis not present

## 2023-08-29 DIAGNOSIS — I129 Hypertensive chronic kidney disease with stage 1 through stage 4 chronic kidney disease, or unspecified chronic kidney disease: Secondary | ICD-10-CM | POA: Diagnosis not present

## 2023-08-29 DIAGNOSIS — I480 Paroxysmal atrial fibrillation: Secondary | ICD-10-CM | POA: Diagnosis not present

## 2023-09-12 ENCOUNTER — Encounter: Payer: Self-pay | Admitting: Internal Medicine

## 2023-09-12 ENCOUNTER — Inpatient Hospital Stay: Payer: Medicare HMO | Attending: Internal Medicine

## 2023-09-12 ENCOUNTER — Inpatient Hospital Stay: Payer: Medicare HMO

## 2023-09-12 ENCOUNTER — Inpatient Hospital Stay (HOSPITAL_BASED_OUTPATIENT_CLINIC_OR_DEPARTMENT_OTHER): Payer: Medicare HMO | Admitting: Internal Medicine

## 2023-09-12 VITALS — BP 163/55 | HR 50 | Resp 16

## 2023-09-12 VITALS — BP 138/58 | HR 63 | Temp 97.8°F | Resp 16 | Wt 136.0 lb

## 2023-09-12 DIAGNOSIS — N184 Chronic kidney disease, stage 4 (severe): Secondary | ICD-10-CM | POA: Insufficient documentation

## 2023-09-12 DIAGNOSIS — D649 Anemia, unspecified: Secondary | ICD-10-CM | POA: Insufficient documentation

## 2023-09-12 DIAGNOSIS — D75839 Thrombocytosis, unspecified: Secondary | ICD-10-CM | POA: Insufficient documentation

## 2023-09-12 DIAGNOSIS — D473 Essential (hemorrhagic) thrombocythemia: Secondary | ICD-10-CM | POA: Insufficient documentation

## 2023-09-12 LAB — CBC WITH DIFFERENTIAL (CANCER CENTER ONLY)
Abs Immature Granulocytes: 0.11 10*3/uL — ABNORMAL HIGH (ref 0.00–0.07)
Basophils Absolute: 0.1 10*3/uL (ref 0.0–0.1)
Basophils Relative: 1 %
Eosinophils Absolute: 0.1 10*3/uL (ref 0.0–0.5)
Eosinophils Relative: 2 %
HCT: 32.7 % — ABNORMAL LOW (ref 36.0–46.0)
Hemoglobin: 10.6 g/dL — ABNORMAL LOW (ref 12.0–15.0)
Immature Granulocytes: 1 %
Lymphocytes Relative: 15 %
Lymphs Abs: 1.4 10*3/uL (ref 0.7–4.0)
MCH: 34.4 pg — ABNORMAL HIGH (ref 26.0–34.0)
MCHC: 32.4 g/dL (ref 30.0–36.0)
MCV: 106.2 fL — ABNORMAL HIGH (ref 80.0–100.0)
Monocytes Absolute: 1.5 10*3/uL — ABNORMAL HIGH (ref 0.1–1.0)
Monocytes Relative: 16 %
Neutro Abs: 6.1 10*3/uL (ref 1.7–7.7)
Neutrophils Relative %: 65 %
Platelet Count: 707 10*3/uL — ABNORMAL HIGH (ref 150–400)
RBC: 3.08 MIL/uL — ABNORMAL LOW (ref 3.87–5.11)
RDW: 15 % (ref 11.5–15.5)
WBC Count: 9.4 10*3/uL (ref 4.0–10.5)
nRBC: 0 % (ref 0.0–0.2)

## 2023-09-12 LAB — CMP (CANCER CENTER ONLY)
ALT: 15 U/L (ref 0–44)
AST: 19 U/L (ref 15–41)
Albumin: 3.8 g/dL (ref 3.5–5.0)
Alkaline Phosphatase: 62 U/L (ref 38–126)
Anion gap: 6 (ref 5–15)
BUN: 25 mg/dL — ABNORMAL HIGH (ref 8–23)
CO2: 26 mmol/L (ref 22–32)
Calcium: 9.3 mg/dL (ref 8.9–10.3)
Chloride: 106 mmol/L (ref 98–111)
Creatinine: 1.42 mg/dL — ABNORMAL HIGH (ref 0.44–1.00)
GFR, Estimated: 36 mL/min — ABNORMAL LOW (ref >60)
Glucose, Bld: 138 mg/dL — ABNORMAL HIGH (ref 70–99)
Potassium: 3.9 mmol/L (ref 3.5–5.1)
Sodium: 138 mmol/L (ref 135–145)
Total Bilirubin: 0.8 mg/dL (ref 0.0–1.2)
Total Protein: 6.9 g/dL (ref 6.5–8.1)

## 2023-09-12 LAB — LACTATE DEHYDROGENASE: LDH: 324 U/L — ABNORMAL HIGH (ref 98–192)

## 2023-09-12 MED ORDER — IRON SUCROSE 20 MG/ML IV SOLN
200.0000 mg | Freq: Once | INTRAVENOUS | Status: AC
  Administered 2023-09-12: 200 mg via INTRAVENOUS
  Filled 2023-09-12: qty 10

## 2023-09-12 NOTE — Progress Notes (Signed)
 [facility] CONSULT NOTE  Patient Care Team: Kandyce Rud, MD as PCP - General (Family Medicine) Mariah Milling Tollie Pizza, MD as PCP - Cardiology (Cardiology) Antonieta Iba, MD as Consulting Physician (Cardiology) Earna Coder, MD as Consulting Physician (Internal Medicine)  CHIEF COMPLAINTS/PURPOSE OF CONSULTATION: ET  Oncology History Overview Note  # ESSENTIAL THROMBOCYTOSIS [name] 2019- 684; July 2020- platelets-992 ; Hb-12 white count- 9];INTERMEDIATE RISK;July 2020-; CALR MUTATION POSITIVE; July 2020- Hydrea 500 mg/day.   # A.fib on xarelto [A.fib]  # Chronic Kidney disease-III   Essential thrombocytosis (HCC)  01/30/2019 Initial Diagnosis   Essential thrombocytosis (HCC)    HISTORY OF PRESENTING ILLNESS: Alone. Ambulating independently.  [name] 83 y.o.  female pleasant patient with  myeloproliferative neoplasm, with essential thrombocythemia and prefibrotic primary myelofibrosis most recently on Hydrea; CKD stage III A-fib on Eliquis, CHF is here for follow-up.  Patient is doing well.  She has chronic diarrhea and hemorrhoids. Denies any rectal bleeding at this time. No swelling in legs.  She is current on Lasix as per cardiology.  Review of Systems  Constitutional:  Positive for malaise/fatigue. Negative for chills, diaphoresis, fever and weight loss.  HENT:  Negative for nosebleeds and sore throat.   Eyes:  Negative for double vision.  Respiratory:  Positive for cough and shortness of breath. Negative for hemoptysis, sputum production and wheezing.   Cardiovascular:  Negative for chest pain, palpitations, orthopnea and leg swelling.  Gastrointestinal:  Positive for diarrhea. Negative for abdominal pain, blood in stool, constipation, heartburn, melena, nausea and vomiting.  Genitourinary:  Negative for dysuria, frequency and urgency.  Musculoskeletal:  Positive for joint pain. Negative for back pain.  Skin: Negative.  Negative for itching and  rash.  Neurological:  Negative for dizziness, tingling, focal weakness, weakness and headaches.  Endo/Heme/Allergies:  Does not bruise/bleed easily.  Psychiatric/Behavioral:  Negative for depression. The patient is not nervous/anxious and does not have insomnia.      MEDICAL HISTORY:  Past Medical History:  Diagnosis Date   Chronic atrial fibrillation (HCC)    a. Unsuccessful DCCV 05/2016; b. CHADS2VASc => 4 (HTN, age x 2, female)--> Xarelto.   CKD (chronic kidney disease), stage III (HCC)    Epistaxis    Gout    History of stress test    a. 04/2016 MV: EF 80%, no ischemia.   HLD (hyperlipidemia)    Hypertension    Mitral regurgitation    a. Echo 04/2016: EF 60-65%, no RWMA, mild AI, mild MR, mild biatrial enlargement, PASP 32 mmHg, trivial pericardial effusion noted posterior to the heart   Syncope     SURGICAL HISTORY: Past Surgical History:  Procedure Laterality Date   ABDOMINAL HYSTERECTOMY     APPENDECTOMY     ELECTROPHYSIOLOGIC STUDY N/A 05/18/2016   Procedure: CARDIOVERSION;  Surgeon: Antonieta Iba, MD;  Location: [facility] ORS;  Service: Cardiovascular;  Laterality: N/A;   IR BONE MARROW BIOPSY & ASPIRATION  05/18/2023   OOPHORECTOMY     TONSILLECTOMY     TOTAL VAGINAL HYSTERECTOMY      SOCIAL HISTORY: Social History   Socioeconomic History   Marital status: Widowed    Spouse name: Not on file   Number of children: Not on file   Years of education: Not on file   Highest education level: Not on file  Occupational History   Not on file  Tobacco Use   Smoking status: Former   Smokeless tobacco: Never  Vaping Use  Vaping status: Never Used  Substance and Sexual Activity   Alcohol use: No   Drug use: No   Sexual activity: Not on file  Other Topics Concern   Not on file  Social History Narrative   [name]; self; remote smoking; No alcohol; 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    Social Drivers of Corporate investment banker Strain: Low Risk  (01/27/2023)    Received from [facility]   Overall Financial Resource Strain (CARDIA)    Difficulty of Paying Living Expenses: Not hard at all  Food Insecurity: No Food Insecurity (01/27/2023)   Received from [facility]   Hunger Vital Sign    Worried About Running Out of Food in the Last Year: Never true    Ran Out of Food in the Last Year: Never true  Transportation Needs: No Transportation Needs (01/27/2023)   Received [facility] - Transportation    In the past 12 months, has lack of transportation kept you from medical appointments or from getting medications?: No    Lack of Transportation (Non-Medical): No  Physical Activity: Not on file  Stress: Not on file  Social Connections: Not on file  Intimate Partner Violence: Not on file    FAMILY HISTORY: Family History  Problem Relation Age of Onset   Heart failure Mother    Atrial fibrillation Brother     ALLERGIES:  is allergic to codeine.  MEDICATIONS:  Current Outpatient Medications  Medication Sig Dispense Refill   acetaminophen (TYLENOL) 500 MG tablet Take 500-1,000 mg by mouth every 6 (six) hours as needed for moderate pain or headache.     cetirizine (ZYRTEC) 10 MG tablet Take 10 mg by mouth daily.     Cholecalciferol (VITAMIN D3) 1000 units CAPS Take 1,000 Units by mouth daily.     colchicine 0.6 MG tablet  (Patient not taking: Reported on 08/13/2022)     diltiazem (CARDIZEM CD) 240 MG 24 hr capsule TAKE 1 CAPSULE BY MOUTH EVERY DAY 90 capsule 2   doxazosin (CARDURA) 8 MG tablet Take 1/2 tab (4 mg) tablet in the AM and 1/2 tab (4 mg) in the evening 90 tablet 3   furosemide (LASIX) 20 MG tablet TAKE 1 TABLET BY MOUTH MON,WED,FRI, AND SAT 60 tablet 1   hydroxyurea (HYDREA) 500 MG capsule TAKE 1 CAPSULE BY MOUTH TWICE A DAY 180 capsule 1   loperamide (IMODIUM) 2 MG capsule Take 2 mg by mouth as needed for diarrhea or loose stools.     losartan (COZAAR) 25 MG tablet  Take 25 mg by mouth daily.     ondansetron (ZOFRAN) 4 MG tablet Take 1 tablet (4 mg total) by mouth every 8 (eight) hours as needed for nausea or vomiting. 20 tablet 0   potassium chloride (KLOR-CON) 10 MEQ tablet TAKE 1 TABLET BY MOUTH MON,WED,FRI, AND SAT WITH FUROSEMIDE 48 tablet 0   XARELTO 15 MG TABS tablet TAKE 1 TABLET (15 MG TOTAL) BY MOUTH DAILY WITH SUPPER 90 tablet 1   No current facility-administered medications for this visit.     PHYSICAL EXAMINATION:   Vitals:   09/12/23 1416  BP: (!) 138/58  Pulse: 63  Resp: 16  Temp: 97.8 F (36.6 C)  SpO2: 98%      Filed Weights   09/12/23 1416  Weight: 136 lb (61.7 kg)       Positive for bilateral crackles at the bases. Physical Exam HENT:  Head: Normocephalic and atraumatic.     Mouth/Throat:     Pharynx: No oropharyngeal exudate.  Eyes:     Pupils: Pupils are equal, round, and reactive to light.  Cardiovascular:     Rate and Rhythm: Normal rate and regular rhythm.     Heart sounds: Murmur heard.  Pulmonary:     Effort: Pulmonary effort is normal. No respiratory distress.     Breath sounds: Normal breath sounds. No wheezing.  Abdominal:     General: Bowel sounds are normal. There is no distension.     Palpations: Abdomen is soft. There is no mass.     Tenderness: There is no abdominal tenderness. There is no guarding or rebound.  Musculoskeletal:        General: No tenderness. Normal range of motion.     Cervical back: Normal range of motion and neck supple.  Skin:    General: Skin is warm.  Neurological:     Mental Status: She is alert and oriented to person, place, and time.  Psychiatric:        Mood and Affect: Affect normal.    LABORATORY DATA:  I have reviewed the data as listed Lab Results  Component Value Date   WBC 9.4 09/12/2023   HGB 10.6 (L) 09/12/2023   HCT 32.7 (L) 09/12/2023   MCV 106.2 (H) 09/12/2023   PLT 707 (H) 09/12/2023   Recent Labs    06/03/23 1507 08/12/23 1359  09/12/23 1403  NA 139 137 138  K 4.2 4.1 3.9  CL 103 104 106  CO2 24 25 26   GLUCOSE 118* 110* 138*  BUN 26* 25* 25*  CREATININE 1.37* 1.33* 1.42*  CALCIUM 9.6 9.4 9.3  GFRNONAA 38* 39* 36*  PROT 7.2 7.1 6.9  ALBUMIN 3.9 3.8 3.8  AST 18 18 19   ALT 12 14 15   ALKPHOS 65 63 62  BILITOT 0.5 0.8 0.8     No results found.  ASSESSMENT & PLAN:   Essential thrombocytosis (HCC) # 2019- Essential thrombocytosis-CALR positive; Intermediate risk given her age- on Hydrea however, given worsening anemia- NOV 2024- BONE MARROW BIOPSY:  the findings are consistent with a myeloproliferative neoplasm, with essential thrombocythemia and prefibrotic primary myelofibrosis being the main diagnostic considerations.  The peripheral smear shows red cell agglutination and causes of red cell agglutination may be considered if  clinically indicated- however, Haptoglobin- NOV 2024- 81/WNL.   #  For now continue hydrea 500 mg one day. Platlets > 700- monitor for now- on IV iron- if worse consider increasing hydrea at next visit.  US abdomen OCt 2024-no hepatosplenomegaly; no ascites.    # # Anemia/ ? Intermittent rectal bleeding-once a month [>> colonoscopy- Dr.Andy Lamb]; Discussed re: repeating colonoscopy- OCT 2024- 19%; ferritin-41.   bone marrow- scant iron- continue  gentle iron1 pill a day.  Also discussed regarding addition of erythropoietin agent along with iron infusions. Proceed with IV venofer today.  Consider retacrit at next visit.   # CKD stage III-IV[GFR-]- on diuretic- slowly worse at GFR  34- defer to PCP/card [Dr.Gollan]; JAN 2024- US kidneys - negative for obstruction. No nephrology evaluation. Stable.  # A.fib- on xarelto 15 mg/day [CKD; Dr.Gollan]; no concerns blood loss. -stable.    Might need to change hydrea script  # DISPOSITION:  # venofer today; HOLD retacrit today # follow up in 2  month-MD-labs; cbc/cmp/LDH; iron studies; ferritin- haptoglobin- possible venofer or retacrit--  -Dr.B       Earna Coder,  MD 09/12/2023 3:04 PM

## 2023-09-12 NOTE — Patient Instructions (Signed)

## 2023-09-12 NOTE — Progress Notes (Signed)
 Patient is doing well, she is wanting to see what her lab results are from today.

## 2023-09-12 NOTE — Assessment & Plan Note (Addendum)
#   2019- Essential thrombocytosis-CALR positive; Intermediate risk given her age- on Hydrea however, given worsening anemia- NOV 2024- BONE MARROW BIOPSY:  the findings are consistent with a myeloproliferative neoplasm, with essential thrombocythemia and prefibrotic primary myelofibrosis being the main diagnostic considerations.  The peripheral smear shows red cell agglutination and causes of red cell agglutination may be considered if  clinically indicated- however, Haptoglobin- NOV 2024- 81/WNL.   #  For now continue hydrea 500 mg one day. Platlets > 700- monitor for now- on IV iron- if worse consider increasing hydrea at next visit.  US abdomen OCt 2024-no hepatosplenomegaly; no ascites.    # # Anemia/ ? Intermittent rectal bleeding-once a month [>> colonoscopy- Dr.Andy Lamb]; Discussed re: repeating colonoscopy- OCT 2024- 19%; ferritin-41.   bone marrow- scant iron- continue  gentle iron1 pill a day.  Also discussed regarding addition of erythropoietin agent along with iron infusions. Proceed with IV venofer today.  Consider retacrit at next visit.   # CKD stage III-IV[GFR-]- on diuretic- slowly worse at GFR  34- defer to PCP/card [Dr.Gollan]; JAN 2024- US kidneys - negative for obstruction. No nephrology evaluation. Stable.  # A.fib- on xarelto 15 mg/day [CKD; Dr.Gollan]; no concerns blood loss. -stable.    Might need to change hydrea script  # DISPOSITION:  # venofer today; HOLD retacrit today # follow up in 2  month-MD-labs; cbc/cmp/LDH; iron studies; ferritin- haptoglobin- possible venofer or retacrit-- -Dr.B

## 2023-10-24 DIAGNOSIS — C44622 Squamous cell carcinoma of skin of right upper limb, including shoulder: Secondary | ICD-10-CM | POA: Diagnosis not present

## 2023-10-24 DIAGNOSIS — D0461 Carcinoma in situ of skin of right upper limb, including shoulder: Secondary | ICD-10-CM | POA: Diagnosis not present

## 2023-10-30 ENCOUNTER — Other Ambulatory Visit: Payer: Self-pay | Admitting: Cardiovascular Disease

## 2023-11-07 DIAGNOSIS — L905 Scar conditions and fibrosis of skin: Secondary | ICD-10-CM | POA: Diagnosis not present

## 2023-11-07 DIAGNOSIS — C44619 Basal cell carcinoma of skin of left upper limb, including shoulder: Secondary | ICD-10-CM | POA: Diagnosis not present

## 2023-11-09 ENCOUNTER — Other Ambulatory Visit: Payer: Self-pay | Admitting: Cardiovascular Disease

## 2023-11-11 ENCOUNTER — Inpatient Hospital Stay: Attending: Internal Medicine

## 2023-11-11 ENCOUNTER — Inpatient Hospital Stay

## 2023-11-11 ENCOUNTER — Encounter: Payer: Self-pay | Admitting: Internal Medicine

## 2023-11-11 ENCOUNTER — Inpatient Hospital Stay (HOSPITAL_BASED_OUTPATIENT_CLINIC_OR_DEPARTMENT_OTHER): Admitting: Internal Medicine

## 2023-11-11 VITALS — BP 160/78 | HR 67 | Temp 96.4°F | Resp 18 | Ht 62.5 in | Wt 136.8 lb

## 2023-11-11 VITALS — BP 164/83 | HR 47 | Resp 18

## 2023-11-11 DIAGNOSIS — D473 Essential (hemorrhagic) thrombocythemia: Secondary | ICD-10-CM | POA: Diagnosis present

## 2023-11-11 DIAGNOSIS — D649 Anemia, unspecified: Secondary | ICD-10-CM | POA: Insufficient documentation

## 2023-11-11 DIAGNOSIS — Z87891 Personal history of nicotine dependence: Secondary | ICD-10-CM | POA: Diagnosis not present

## 2023-11-11 DIAGNOSIS — D75839 Thrombocytosis, unspecified: Secondary | ICD-10-CM | POA: Insufficient documentation

## 2023-11-11 LAB — CBC WITH DIFFERENTIAL (CANCER CENTER ONLY)
Abs Immature Granulocytes: 0.1 10*3/uL — ABNORMAL HIGH (ref 0.00–0.07)
Basophils Absolute: 0.1 10*3/uL (ref 0.0–0.1)
Basophils Relative: 1 %
Eosinophils Absolute: 0.2 10*3/uL (ref 0.0–0.5)
Eosinophils Relative: 2 %
HCT: 32.7 % — ABNORMAL LOW (ref 36.0–46.0)
Hemoglobin: 10.8 g/dL — ABNORMAL LOW (ref 12.0–15.0)
Immature Granulocytes: 1 %
Lymphocytes Relative: 16 %
Lymphs Abs: 1.4 10*3/uL (ref 0.7–4.0)
MCH: 35.4 pg — ABNORMAL HIGH (ref 26.0–34.0)
MCHC: 33 g/dL (ref 30.0–36.0)
MCV: 107.2 fL — ABNORMAL HIGH (ref 80.0–100.0)
Monocytes Absolute: 1.3 10*3/uL — ABNORMAL HIGH (ref 0.1–1.0)
Monocytes Relative: 15 %
Neutro Abs: 5.7 10*3/uL (ref 1.7–7.7)
Neutrophils Relative %: 65 %
Platelet Count: 633 10*3/uL — ABNORMAL HIGH (ref 150–400)
RBC: 3.05 MIL/uL — ABNORMAL LOW (ref 3.87–5.11)
RDW: 15.6 % — ABNORMAL HIGH (ref 11.5–15.5)
WBC Count: 8.7 10*3/uL (ref 4.0–10.5)
nRBC: 0 % (ref 0.0–0.2)

## 2023-11-11 LAB — CMP (CANCER CENTER ONLY)
ALT: 14 U/L (ref 0–44)
AST: 20 U/L (ref 15–41)
Albumin: 3.8 g/dL (ref 3.5–5.0)
Alkaline Phosphatase: 74 U/L (ref 38–126)
Anion gap: 8 (ref 5–15)
BUN: 25 mg/dL — ABNORMAL HIGH (ref 8–23)
CO2: 24 mmol/L (ref 22–32)
Calcium: 9.2 mg/dL (ref 8.9–10.3)
Chloride: 105 mmol/L (ref 98–111)
Creatinine: 1.3 mg/dL — ABNORMAL HIGH (ref 0.44–1.00)
GFR, Estimated: 40 mL/min — ABNORMAL LOW (ref >60)
Glucose, Bld: 136 mg/dL — ABNORMAL HIGH (ref 70–99)
Potassium: 4.5 mmol/L (ref 3.5–5.1)
Sodium: 137 mmol/L (ref 135–145)
Total Bilirubin: 0.6 mg/dL (ref 0.0–1.2)
Total Protein: 7.1 g/dL (ref 6.5–8.1)

## 2023-11-11 LAB — IRON AND TIBC
Iron: 89 ug/dL (ref 28–170)
Saturation Ratios: 25 % (ref 10.4–31.8)
TIBC: 351 ug/dL (ref 250–450)
UIBC: 262 ug/dL

## 2023-11-11 LAB — LACTATE DEHYDROGENASE: LDH: 322 U/L — ABNORMAL HIGH (ref 98–192)

## 2023-11-11 LAB — FERRITIN: Ferritin: 155 ng/mL (ref 11–307)

## 2023-11-11 MED ORDER — IRON SUCROSE 20 MG/ML IV SOLN
200.0000 mg | Freq: Once | INTRAVENOUS | Status: AC
  Administered 2023-11-11: 200 mg via INTRAVENOUS

## 2023-11-11 MED ORDER — SODIUM CHLORIDE 0.9% FLUSH
10.0000 mL | Freq: Once | INTRAVENOUS | Status: AC | PRN
Start: 2023-11-11 — End: 2023-11-11
  Administered 2023-11-11: 10 mL
  Filled 2023-11-11: qty 10

## 2023-11-11 NOTE — Progress Notes (Signed)
 Fatigue NO Headaches: NO Joint pain: NO Bleeding from gums/nose: NO  She has seen dermatology 4/28 for skin cancer, lt hand and rt arm. She sees Oceanographer. Dr. Tresa Frohlich.  Pt states she goes from having constipation to diarrhea with the chemo and iron . Sometimes has to use imodium.   She has had gout since last visit.

## 2023-11-11 NOTE — Progress Notes (Signed)
 [facility] CONSULT NOTE  Patient Care Team: Nestor Banter, MD as PCP - General (Family Medicine) Jerelene Monday Deadra Everts, MD as PCP - Cardiology (Cardiology) Devorah Fonder, MD as Consulting Physician (Cardiology) Gwyn Leos, MD as Consulting Physician (Internal Medicine)  CHIEF COMPLAINTS/PURPOSE OF CONSULTATION: ET  Oncology History Overview Note  # ESSENTIAL THROMBOCYTOSIS Paula Clark 2019- 684; July 2020- platelets-992 ; Hb-12 white count- 9];INTERMEDIATE RISK;July 2020-; CALR MUTATION POSITIVE; July 2020- Hydrea  500 mg/day.   # A.fib on xarelto  [A.fib]  # Chronic Kidney disease-III   Essential thrombocytosis (HCC)  01/30/2019 Initial Diagnosis   Essential thrombocytosis (HCC)    HISTORY OF PRESENTING ILLNESS: Alone. Ambulating independently.  Paula Clark 83 y.o.  female pleasant patient with  myeloproliferative neoplasm, with essential thrombocythemia and prefibrotic primary myelofibrosis most recently on Hydrea ; CKD stage III A-fib on Eliquis, CHF is here for follow-up.  She has seen dermatology 4/28 for skin cancer, lt hand and rt arm. She sees Oceanographer. Dr. Tresa Frohlich.   Pt states she goes from having constipation to diarrhea with the chemo and iron . Sometimes has to use imodium.    She has had gout since last visit. Denies any rectal bleeding at this time. No swelling in legs.  She is current on Lasix  as per cardiology.  Review of Systems  Constitutional:  Positive for malaise/fatigue. Negative for chills, diaphoresis, fever and weight loss.  HENT:  Negative for nosebleeds and sore throat.   Eyes:  Negative for double vision.  Respiratory:  Positive for cough and shortness of breath. Negative for hemoptysis, sputum production and wheezing.   Cardiovascular:  Negative for chest pain, palpitations, orthopnea and leg swelling.  Gastrointestinal:  Positive for diarrhea. Negative for abdominal pain, blood in stool, constipation, heartburn, melena,  nausea and vomiting.  Genitourinary:  Negative for dysuria, frequency and urgency.  Musculoskeletal:  Positive for joint pain. Negative for back pain.  Skin: Negative.  Negative for itching and rash.  Neurological:  Negative for dizziness, tingling, focal weakness, weakness and headaches.  Endo/Heme/Allergies:  Does not bruise/bleed easily.  Psychiatric/Behavioral:  Negative for depression. The patient is not nervous/anxious and does not have insomnia.      MEDICAL HISTORY:  Past Medical History:  Diagnosis Date   Chronic atrial fibrillation (HCC)    a. Unsuccessful DCCV 05/2016; b. CHADS2VASc => 4 (HTN, age x 2, female)--> Xarelto .   CKD (chronic kidney disease), stage III (HCC)    Epistaxis    Gout    History of stress test    a. 04/2016 MV: EF 80%, no ischemia.   HLD (hyperlipidemia)    Hypertension    Mitral regurgitation    a. Echo 04/2016: EF 60-65%, no RWMA, mild AI, mild MR, mild biatrial enlargement, PASP 32 mmHg, trivial pericardial effusion noted posterior to the heart   Syncope     SURGICAL HISTORY: Past Surgical History:  Procedure Laterality Date   ABDOMINAL HYSTERECTOMY     APPENDECTOMY     ELECTROPHYSIOLOGIC STUDY N/A 05/18/2016   Procedure: CARDIOVERSION;  Surgeon: Devorah Fonder, MD;  Location: [facility] ORS;  Service: Cardiovascular;  Laterality: N/A;   IR BONE MARROW BIOPSY & ASPIRATION  05/18/2023   OOPHORECTOMY     TONSILLECTOMY     TOTAL VAGINAL HYSTERECTOMY      SOCIAL HISTORY: Social History   Socioeconomic History   Marital status: Widowed    Spouse name: Not on file   Number of children: Not on file   Years  of education: Not on file   Highest education level: Not on file  Occupational History   Not on file  Tobacco Use   Smoking status: Former   Smokeless tobacco: Never  Vaping Use   Vaping status: Never Used  Substance and Sexual Activity   Alcohol use: No   Drug use: No   Sexual activity: Not on file  Other Topics Concern   Not on  file  Social History Narrative   Paula Clark; self; remote smoking; No alcohol; 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    Social Drivers of Corporate investment banker Strain: Low Risk  (01/27/2023)   Received from [facility]   Overall Financial Resource Strain (CARDIA)    Difficulty of Paying Living Expenses: Not hard at all  Food Insecurity: No Food Insecurity (01/27/2023)   Received from [facility]   Hunger Vital Sign    Worried About Running Out of Food in the Last Year: Never true    Ran Out of Food in the Last Year: Never true  Transportation Needs: No Transportation Needs (01/27/2023)   Received from [facility] - Transportation    In the past 12 months, has lack of transportation kept you from medical appointments or from getting medications?: No    Lack of Transportation (Non-Medical): No  Physical Activity: Not on file  Stress: Not on file  Social Connections: Not on file  Intimate Partner Violence: Not on file    FAMILY HISTORY: Family History  Problem Relation Age of Onset   Heart failure Mother    Atrial fibrillation Brother     ALLERGIES:  is allergic to codeine.  MEDICATIONS:  Current Outpatient Medications  Medication Sig Dispense Refill   acetaminophen  (TYLENOL ) 500 MG tablet Take 500-1,000 mg by mouth every 6 (six) hours as needed for moderate pain or headache.     cetirizine (ZYRTEC) 10 MG tablet Take 10 mg by mouth daily.     Cholecalciferol (VITAMIN D3) 1000 units CAPS Take 1,000 Units by mouth daily.     diltiazem  (CARDIZEM  CD) 240 MG 24 hr capsule TAKE 1 CAPSULE BY MOUTH EVERY DAY 90 capsule 3   doxazosin  (CARDURA ) 8 MG tablet Take 1/2 tab (4 mg) tablet in the AM and 1/2 tab (4 mg) in the evening 90 tablet 3   furosemide  (LASIX ) 20 MG tablet TAKE 1 TABLET BY MOUTH MON,WED,FRI, AND SAT 60 tablet 1   hydroxyurea  (HYDREA ) 500 MG capsule TAKE 1 CAPSULE BY MOUTH TWICE A DAY 180 capsule 1    loperamide (IMODIUM) 2 MG capsule Take 2 mg by mouth as needed for diarrhea or loose stools.     losartan (COZAAR) 25 MG tablet Take 25 mg by mouth daily.     ondansetron  (ZOFRAN ) 4 MG tablet Take 1 tablet (4 mg total) by mouth every 8 (eight) hours as needed for nausea or vomiting. 20 tablet 0   potassium chloride  (KLOR-CON ) 10 MEQ tablet TAKE 1 TABLET BY MOUTH MON,WED,FRI, AND SAT WITH FUROSEMIDE  48 tablet 3   XARELTO  15 MG TABS tablet TAKE 1 TABLET (15 MG TOTAL) BY MOUTH DAILY WITH SUPPER 90 tablet 1   colchicine 0.6 MG tablet  (Patient not taking: Reported on 11/11/2023)     No current facility-administered medications for this visit.     PHYSICAL EXAMINATION:   Vitals:   11/11/23 1437 11/11/23 1500  BP: (!) 162/61 (!) 160/78  Pulse: 67   Resp: 18  Temp: (!) 96.4 F (35.8 C)   SpO2: 96%       Filed Weights   11/11/23 1437  Weight: 136 lb 12.8 oz (62.1 kg)       Positive for bilateral crackles at the bases. Physical Exam HENT:     Head: Normocephalic and atraumatic.     Mouth/Throat:     Pharynx: No oropharyngeal exudate.  Eyes:     Pupils: Pupils are equal, round, and reactive to light.  Cardiovascular:     Rate and Rhythm: Normal rate and regular rhythm.     Heart sounds: Murmur heard.  Pulmonary:     Effort: Pulmonary effort is normal. No respiratory distress.     Breath sounds: Normal breath sounds. No wheezing.  Abdominal:     General: Bowel sounds are normal. There is no distension.     Palpations: Abdomen is soft. There is no mass.     Tenderness: There is no abdominal tenderness. There is no guarding or rebound.  Musculoskeletal:        General: No tenderness. Normal range of motion.     Cervical back: Normal range of motion and neck supple.  Skin:    General: Skin is warm.  Neurological:     Mental Status: She is alert and oriented to person, place, and time.  Psychiatric:        Mood and Affect: Affect normal.    LABORATORY DATA:  I have  reviewed the data as listed Lab Results  Component Value Date   WBC 8.7 11/11/2023   HGB 10.8 (L) 11/11/2023   HCT 32.7 (L) 11/11/2023   MCV 107.2 (H) 11/11/2023   PLT 633 (H) 11/11/2023   Recent Labs    08/12/23 1359 09/12/23 1403 11/11/23 1439  NA 137 138 137  K 4.1 3.9 4.5  CL 104 106 105  CO2 25 26 24   GLUCOSE 110* 138* 136*  BUN 25* 25* 25*  CREATININE 1.33* 1.42* 1.30*  CALCIUM  9.4 9.3 9.2  GFRNONAA 39* 36* 40*  PROT 7.1 6.9 7.1  ALBUMIN 3.8 3.8 3.8  AST 18 19 20   ALT 14 15 14   ALKPHOS 63 62 74  BILITOT 0.8 0.8 0.6     No results found.  ASSESSMENT & PLAN:   Essential thrombocytosis (HCC) # 2019- Essential thrombocytosis-CALR positive; Intermediate risk given her age- on Hydrea  however, given worsening anemia- NOV 2024- BONE MARROW BIOPSY:  the findings are consistent with a myeloproliferative neoplasm, with essential thrombocythemia and prefibrotic primary myelofibrosis being the main diagnostic considerations.  The peripheral smear shows red cell agglutination and causes of red cell agglutination may be considered if  clinically indicated- however, Haptoglobin- NOV 2024- 81/WNL. S abdomen OCt 2024-no hepatosplenomegaly; no ascites.    #  For now continue hydrea  500 mg one day. S/p Venofer -  Platlets -633- - HOLD off increasing hydrea . Proceed with venofer .  # Anemia/ ? Intermittent rectal bleeding-once a month [>> colonoscopy- Dr.Andy Lamb]; Discussed re: repeating colonoscopy- JAn 2025- 17%; ferritin-41.   bone marrow- scant iron - continue  gentle iron1 pill a day.  Consider erythropoietin agent along with iron  infusions. Proceed with IV venofer  today.  Consider retacrit at next visit. Stable- proceed withe venofer -  # CKD stage III-IV[GFR-]- on diuretic- slowly worse at GFR  34- defer to PCP/card [Dr.Gollan]; JAN 2024- US  kidneys - negative for obstruction. No nephrology evaluation. Stable.  # A.fib- on xarelto  15 mg/day [CKD; Dr.Gollan]; no concerns blood loss.  - Stable  Might  need to change hydrea  script  # DISPOSITION:  # venofer  today; HOLD retacrit today # follow up in 3  month-MD-labs; cbc/cmp/LDH; iron  studies; ferritin- possible venofer  or retacrit-- -Dr.B        Gwyn Leos, MD 11/11/2023 3:23 PM

## 2023-11-11 NOTE — Assessment & Plan Note (Addendum)
#   2019- Essential thrombocytosis-CALR positive; Intermediate risk given her age- on Hydrea  however, given worsening anemia- NOV 2024- BONE MARROW BIOPSY:  the findings are consistent with a myeloproliferative neoplasm, with essential thrombocythemia and prefibrotic primary myelofibrosis being the main diagnostic considerations.  The peripheral smear shows red cell agglutination and causes of red cell agglutination may be considered if  clinically indicated- however, Haptoglobin- NOV 2024- 81/WNL. S abdomen OCt 2024-no hepatosplenomegaly; no ascites.    #  For now continue hydrea  500 mg one day. S/p Venofer -  Platlets -633- - HOLD off increasing hydrea . Proceed with venofer .  # Anemia/ ? Intermittent rectal bleeding-once a month [>> colonoscopy- Dr.Andy Lamb]; Discussed re: repeating colonoscopy- JAn 2025- 17%; ferritin-41.   bone marrow- scant iron - continue  gentle iron1 pill a day.  Consider erythropoietin agent along with iron  infusions. Proceed with IV venofer  today.  Consider retacrit at next visit. Stable- proceed withe venofer -  # CKD stage III-IV[GFR-]- on diuretic- slowly worse at GFR  34- defer to PCP/card [Dr.Gollan]; JAN 2024- US  kidneys - negative for obstruction. No nephrology evaluation. Stable.  # A.fib- on xarelto  15 mg/day [CKD; Dr.Gollan]; no concerns blood loss. - Stable  Might need to change hydrea  script  # DISPOSITION:  # venofer  today; HOLD retacrit today # follow up in 3  month-MD-labs; cbc/cmp/LDH; iron  studies; ferritin- possible venofer  or retacrit-- -Dr.B

## 2023-11-13 ENCOUNTER — Other Ambulatory Visit: Payer: Self-pay | Admitting: Internal Medicine

## 2023-11-13 LAB — HAPTOGLOBIN: Haptoglobin: 76 mg/dL (ref 41–333)

## 2023-11-14 ENCOUNTER — Encounter: Payer: Self-pay | Admitting: Internal Medicine

## 2023-11-21 DIAGNOSIS — D0462 Carcinoma in situ of skin of left upper limb, including shoulder: Secondary | ICD-10-CM | POA: Diagnosis not present

## 2024-01-19 ENCOUNTER — Other Ambulatory Visit: Payer: Self-pay | Admitting: Cardiovascular Disease

## 2024-01-19 NOTE — Telephone Encounter (Signed)
Please contact pt for future appointment. Pt overdue for follow up. Pt needing refills.

## 2024-01-19 NOTE — Telephone Encounter (Signed)
Pt scheduled on 7/14

## 2024-01-19 NOTE — Telephone Encounter (Signed)
 Left voice mail

## 2024-01-23 ENCOUNTER — Encounter: Payer: Self-pay | Admitting: Cardiology

## 2024-01-23 ENCOUNTER — Ambulatory Visit: Attending: Cardiology | Admitting: Cardiology

## 2024-01-23 VITALS — BP 158/78 | HR 54 | Ht 62.0 in | Wt 138.2 lb

## 2024-01-23 DIAGNOSIS — D473 Essential (hemorrhagic) thrombocythemia: Secondary | ICD-10-CM | POA: Diagnosis not present

## 2024-01-23 DIAGNOSIS — I5032 Chronic diastolic (congestive) heart failure: Secondary | ICD-10-CM

## 2024-01-23 DIAGNOSIS — N183 Chronic kidney disease, stage 3 unspecified: Secondary | ICD-10-CM | POA: Diagnosis not present

## 2024-01-23 DIAGNOSIS — I10 Essential (primary) hypertension: Secondary | ICD-10-CM | POA: Diagnosis not present

## 2024-01-23 DIAGNOSIS — I4819 Other persistent atrial fibrillation: Secondary | ICD-10-CM

## 2024-01-23 NOTE — Patient Instructions (Signed)
 Medication Instructions:  Your physician recommends that you continue on your current medications as directed. Please refer to the Current Medication list given to you today.   *If you need a refill on your cardiac medications before your next appointment, please call your pharmacy*  Lab Work: No labs ordered today  If you have labs (blood work) drawn today and your tests are completely normal, you will receive your results only by: MyChart Message (if you have MyChart) OR A paper copy in the mail If you have any lab test that is abnormal or we need to change your treatment, we will call you to review the results.  Testing/Procedures: No test ordered today   Follow-Up: At [facility], you and your health needs are our priority.  As part of our continuing mission to provide you with exceptional heart care, our providers are all part of one team.  This team includes your primary Cardiologist (physician) and Advanced Practice Providers or APPs (Physician Assistants and Nurse Practitioners) who all work together to provide you with the care you need, when you need it.  Your next appointment:   12 month(s)  Provider:   Timothy Gollan, MD or Tylene Lunch, NP    We recommend signing up for the patient portal called MyChart.  Sign up information is provided on this After Visit Summary.  MyChart is used to connect with patients for Virtual Visits (Telemedicine).  Patients are able to view lab/test results, encounter notes, upcoming appointments, etc.  Non-urgent messages can be sent to your provider as well.   To learn more about what you can do with MyChart, go to [email or web address].

## 2024-01-23 NOTE — Progress Notes (Signed)
 Cardiology Office Note   Date:  01/23/2024  ID:  Paula Clark, DOB 18-Jan-1941, MRN [id number] PCP: Paula Lame, MD  [facility] Providers Cardiologist:  Paula Lunger, MD     History of Present Illness Paula Clark is a 83 y.o. female with a past medical history of permanent atrial fibrillation, hypertension, CKD stage III, hyperlipidemia, thrombocytosis, gout, history of recurrent epistaxis, anemia, history of syncope, who is here today for follow-up.   She was admitted to [facility] on early October 2017 and did not have any previously known cardiac history at that time.  She been told she had an irregular heartbeat dating back to her 16s.  No prior EKGs on file for review at that time.  She initially presented to the [facility] emergency department 9/27 with epistaxis requiring nasal packing.  She then experienced repeat epistaxis x 4 on 10/1 leading to repeat packing and discharge from the ED.  Unfortunately while walking to her car from the ED she had had another nosebleed.  ENT repacked her nose and recommended hospital admission.  Blood pressure during each emergency department visit was noted to be elevated ranging 170-190 systolic.  She was noted to be in atrial fibrillation on telemetry.  EKG showed A-fib at 80 bpm.  TSH was normal.  Magnesium  1.5, potassium 3.4, hemoglobin 11.9, troponin peaked at 0.03.  Echocardiogram revealed an LVEF of 60-65%, no RWMA, mild AI/MR, mildly dilated left atrium, PASP 32 mmHg.  She was rate controlled with diltiazem  and Toprol -XL initially metoprolol  was held secondary to bradycardia.  Unfortunately she was not started on anticoagulation given ongoing nosebleeds at that time.  There was consideration of possible Watchman device in the future if she can tolerate periprocedural anticoagulation with aspirin /Plavix postprocedure.  She was discharged 10/5.  She returned to [facility] 10/6 with a syncopal episode/weakness.  Reportedly orthostatics were negative.   Troponin peaked at 0.03.  Hemoglobin stable on admission.  EKG showed rate controlled atrial fibrillation.  It was felt her syncope was related to deconditioning.  Her Cardizem  was increased prior to discharge secondary to increased rates with ambulation.  In hospital follow-up on 10/11 she was doing well.  She did note some positional dizziness.  She was evaluated by ENT and was cleared for full dose anticoagulation from their standpoint.  She was started on Xarelto  15 mg daily given her creatinine clearance less than 50.  She underwent planned outpatient nuclear stress testing 10/23 that was normal.  At that time she reported no further epistaxis and was tolerating anticoagulation without issues.  After being on uninterrupted anticoagulation she was scheduled for cardioversion procedure.  She underwent direct-current cardioversion on 05/18/2016.  She was shocked 3 times and did not convert to normal sinus rhythm she remained in atrial fibrillation.  Patient was given several treatment options and there were and opted for review for the Watchman procedure versus amiodarone load and repeat cardioversion.  She was evaluated by Dr. Wonda 05/28/2016 for watchman evaluation.  Procedural risk for the watchman implant were reviewed with the patient.  Given the patient's poor candidacy for long-term oral anticoagulation and ability to tolerate short-term anticoagulation.  She was recommended for Watchman left atrial appendage closure system.  TEE would need to be scheduled to review the LAA anatomy.  If she was deemed a candidate after TEE results it would be scheduled.  After discussing her case with ENT there were concern that patient unable to tolerate aspirin  and Plavix.  Therefore TEE was never  performed and she was not worked up for further evaluation for [employer].   She was last seen in clinic 01/10/2023 by Dr. Gollan.  At that time she continued to complain of weakness, fatigue, tiredness, shortness of breath  with exertion.  She reported episodes of low energy which may require resting.  Continued to have peripheral edema that was better on furosemide  every other day.  Slight bump in creatinine was noted.  Prior echocardiogram in 2023 revealed an LVEF of 60 to 65%, moderately dilated left atrium, and ascending aorta at 4 cm.  She was continued on rivaroxaban  15 mg daily.  Her Cardura  was decreased to 4 mg twice daily given episodes of weakness and malaise unable to exclude hypotension.  Hemoglobin was stable at 10.  There were no further testing that was ordered.  She returns to clinic today stating for the cardiac perspective she has been doing fairly well.  She denies any chest pain or shortness of breath.  She continues to have fatigue that she states is unchanged.  She continues to have iron  infusion followed by hematology.  She states she has not missed any of her rivaroxaban  and has not had recurrent issues with bleeding.  She has had occasional issues with bleeding of hemorrhoids and was provided cream by her primary care provider.  Hemoglobin has remained stable.  She denies any hospitalizations or visits to the emergency department.  ROS: 10 point review of system has been reviewed and considered negative with exception was been listed in the HPI  Studies Reviewed EKG Interpretation Date/Time:  Monday January 23 2024 15:50:37 EDT Ventricular Rate:  62 PR Interval:    QRS Duration:  88 QT Interval:  408 QTC Calculation: 414 R Axis:   40  Text Interpretation: Atrial fibrillation When compared with ECG of 10-Jan-2023 16:36, No significant change was found Confirmed by Gerard Frederick ([id number]) on 01/23/2024 3:54:40 PM    2D echo 11/17/2021 1. Left ventricular ejection fraction, by estimation, is 60 to 65%. The  left ventricle has normal function. The left ventricle has no regional  wall motion abnormalities. There is mild left ventricular hypertrophy.  Left ventricular diastolic parameters  are  indeterminate.   2. Right ventricular systolic function is normal. The right ventricular  size is normal. There is mildly elevated pulmonary artery systolic  pressure. The estimated right ventricular systolic pressure is 43.2 mmHg.   3. Left atrial size was moderately dilated.   4. The mitral valve is normal in structure. Mild mitral valve  regurgitation. No evidence of mitral stenosis.   5. The aortic valve is tricuspid. Aortic valve regurgitation is not  visualized. Aortic valve sclerosis is present, with no evidence of aortic  valve stenosis.   6. There is mild dilatation of the ascending aorta, measuring 40 mm.   7. The inferior vena cava is normal in size with greater than 50%  respiratory variability, suggesting right atrial pressure of 3 mmHg.   Lexiscan  MPI 04/30/2016 Pharmacological myocardial perfusion imaging study with no significant ischemia Normal wall motion, EF estimated at 80% No EKG changes concerning for ischemia at peak stress or in recovery. Rhythm is atrial fibrillation Low risk scan  2D echo 04/13/2016 Study Conclusions  - Left ventricle: The cavity size was normal. Wall thickness was    increased in a pattern of mild LVH. Systolic function was normal.    The estimated ejection fraction was in the range of 60% to 65%.    Wall motion was normal;  there were no regional wall motion    abnormalities.  - Aortic valve: There was mild regurgitation.  - Mitral valve: There was mild regurgitation.  - Left atrium: The atrium was mildly dilated.  - Right atrium: The atrium was mildly dilated.  - Pulmonary arteries: PA peak pressure: 32 mm Hg (S).  - Pericardium, extracardiac: A trivial pericardial effusion was    identified posterior to the heart.    Risk Assessment/Calculations  CHA2DS2-VASc Score = 5   This indicates a 7.2% annual risk of stroke. The patient's score is based upon: CHF History: 1 HTN History: 1 Diabetes History: 0 Stroke History: 0 Vascular  Disease History: 0 Age Score: 2 Gender Score: 1    HYPERTENSION CONTROL Vitals:   01/23/24 1543 01/23/24 1600  BP: (!) 170/78 (!) 158/78    The patient's blood pressure is elevated above target today.  In order to address the patient's elevated BP: Blood pressure will be monitored at home to determine if medication changes need to be made. (not all medications have been taking as of yet today)          Physical Exam VS:  BP (!) 158/78 (BP Location: Left Arm, Patient Position: Sitting, Cuff Size: Normal)   Pulse (!) 54   Ht 5' 2 (1.575 m)   Wt 138 lb 3.2 oz (62.7 kg)   SpO2 98%   BMI 25.28 kg/m        Wt Readings from Last 3 Encounters:  01/23/24 138 lb 3.2 oz (62.7 kg)  11/11/23 136 lb 12.8 oz (62.1 kg)  09/12/23 136 lb (61.7 kg)    GEN: Well nourished, well developed in no acute distress NECK: No JVD; No carotid bruits CARDIAC: IR IR,  I/VI systolic murmur RUSB, without rubs or gallops RESPIRATORY:  Clear to auscultation without rales, wheezing or rhonchi  ABDOMEN: Soft, non-tender, non-distended EXTREMITIES:  No edema; No deformity   ASSESSMENT AND PLAN Permanent atrial fibrillation with A-fib noted on EKG today with a rate of 62 with no acute changes.  She is continued on Cardizem  240 mg daily, and rivaroxaban  15 mg daily for CHA2DS2-VASc score of at least 5 for stroke prophylaxis.  She occasionally has issues with bleeding hemorrhoid blood in her last hemoglobin of 10.8 which is remained stable.  Chronic HFpEF with an LVEF of 60 to 65%, no RWMA, mild LVH, and mild mitral regurgitation.  She denies any shortness of breath or peripheral edema today.  She appears to be euvolemic on exam.  NYHA class I symptoms.  She is continued on furosemide  20 mg Monday Wednesday Friday and Saturday.  Primary hypertension with a blood pressure today 170/78 and repeat 158/78.  She is continued on Cardizem  2040 mg daily, Cardura  4 mg in the a.m. 4 mg in the evening, furosemide  20 mg  Monday Wednesday Friday and Saturday, and losartan 25 mg daily.  She has been encouraged to continue to monitor her blood pressure 1 to 2 hours postmedication administration as well.  Thrombocytosis continues to be followed by hematology.  She continues to receive iron  injection was and has follow-up with blood work.  Ongoing management per hematology.  CKD stage III was last seen creatinine 1.30.  She has continued on furosemide  4 days a week and low-dose of losartan for renal protection and blood pressure control.  Creatinine has remained stable.  Ongoing management per PCP.       Dispo: Patient to return to clinic to see MD/APP in 11-12 months  or sooner if needed for further evaluation  Signed, Liv Rallis, NP

## 2024-01-26 ENCOUNTER — Telehealth: Payer: Self-pay | Admitting: Cardiovascular Disease

## 2024-01-26 ENCOUNTER — Other Ambulatory Visit: Payer: Self-pay | Admitting: Cardiovascular Disease

## 2024-01-26 DIAGNOSIS — I4819 Other persistent atrial fibrillation: Secondary | ICD-10-CM

## 2024-01-26 MED ORDER — RIVAROXABAN 15 MG PO TABS: 15.0000 mg | ORAL_TABLET | Freq: Every day | ORAL | 3 refills | Status: AC

## 2024-01-26 NOTE — Telephone Encounter (Signed)
 Prescription refill request for Xarelto  received.  Indication: Afib  Last office visit: 01/23/24 ([place])  Weight: 62.7kg Age: 83 Scr: 1.30 (11/11/23)  CrCl: 31.46ml/min  Appropriate dose Refill sent.

## 2024-01-26 NOTE — Telephone Encounter (Signed)
 Pt c/o medication issue:  1. Name of Medication:   XARELTO  15 MG TABS tablet   2. How are you currently taking this medication (dosage and times per day)?   As prescribed  3. Are you having a reaction (difficulty breathing--STAT)?   4. What is your medication issue?   Patient stated her pharmacy told her her insurance [employer] will no longer cover this medication and she wants to get alternate medication.

## 2024-01-26 NOTE — Telephone Encounter (Signed)
 Returned pt's call and spoke with patient.  She states that [facility] told her they could not fill her Xarelto  prescription because [employer] Medicare doesn't cover medication anymore.  Pt would like an alternative to Xarelto  if one is available that insurance will cover.  I told patient that I would investigate the reason insurance is denying since the current prescription was written in February 2025 and she has not had any difficulties receiving her meds until now.  Pt states that she is currently out of Xarelto .    RX PRIOR AUTH TEAM: I called [facility] [name] and pharmacy rep states that [facility] is requesting a pre-authorization prior to filling the prescription.

## 2024-01-27 ENCOUNTER — Telehealth: Payer: Self-pay

## 2024-01-27 ENCOUNTER — Other Ambulatory Visit (HOSPITAL_COMMUNITY): Payer: Self-pay

## 2024-01-27 ENCOUNTER — Encounter: Payer: Self-pay | Admitting: Internal Medicine

## 2024-01-27 NOTE — Telephone Encounter (Signed)
 After receiving message from Pharmacy that they reached out to [facility] after [facility] stated that medication refill was declined and needed a prior authorization.  It was determined that the medication was refilled on 01/26/2024 by [facility].  I called the patient to verify if [facility] had reached out to her and she stated that after our conversation on the afternoon of 7/17, that she received a call from [facility] stating that her Xaralto prescription was ready for pick up.  Pt states that she is picking up the medication today and that if she has any additional trouble getting her medications she will give our office a call.  Pt very appreciative of helping her resolve being unable to get her prescription

## 2024-01-27 NOTE — Telephone Encounter (Signed)
 Pharmacy Patient Advocate Encounter   Received notification from Physician's Office that prior authorization for XARELTO  is required/requested.   Insurance verification completed.   The patient is insured through [employer] .   Per test claim: Refill too soon. PA is not needed at this time. Medication was filled 01/26/24. Next eligible fill date is 04/03/24.

## 2024-02-06 ENCOUNTER — Inpatient Hospital Stay: Attending: Internal Medicine

## 2024-02-06 DIAGNOSIS — N184 Chronic kidney disease, stage 4 (severe): Secondary | ICD-10-CM | POA: Insufficient documentation

## 2024-02-06 DIAGNOSIS — D649 Anemia, unspecified: Secondary | ICD-10-CM | POA: Insufficient documentation

## 2024-02-06 DIAGNOSIS — D473 Essential (hemorrhagic) thrombocythemia: Secondary | ICD-10-CM | POA: Diagnosis not present

## 2024-02-06 DIAGNOSIS — D75839 Thrombocytosis, unspecified: Secondary | ICD-10-CM | POA: Insufficient documentation

## 2024-02-06 LAB — CMP (CANCER CENTER ONLY)
ALT: 19 U/L (ref 0–44)
AST: 23 U/L (ref 15–41)
Albumin: 4 g/dL (ref 3.5–5.0)
Alkaline Phosphatase: 69 U/L (ref 38–126)
Anion gap: 8 (ref 5–15)
BUN: 23 mg/dL (ref 8–23)
CO2: 24 mmol/L (ref 22–32)
Calcium: 9.2 mg/dL (ref 8.9–10.3)
Chloride: 104 mmol/L (ref 98–111)
Creatinine: 1.41 mg/dL — ABNORMAL HIGH (ref 0.44–1.00)
GFR, Estimated: 37 mL/min — ABNORMAL LOW (ref >60)
Glucose, Bld: 110 mg/dL — ABNORMAL HIGH (ref 70–99)
Potassium: 3.7 mmol/L (ref 3.5–5.1)
Sodium: 136 mmol/L (ref 135–145)
Total Bilirubin: 0.8 mg/dL (ref 0.0–1.2)
Total Protein: 6.9 g/dL (ref 6.5–8.1)

## 2024-02-06 LAB — CBC WITH DIFFERENTIAL (CANCER CENTER ONLY)
Abs Immature Granulocytes: 0.1 K/uL — ABNORMAL HIGH (ref 0.00–0.07)
Basophils Absolute: 0.1 K/uL (ref 0.0–0.1)
Basophils Relative: 1 %
Eosinophils Absolute: 0.1 K/uL (ref 0.0–0.5)
Eosinophils Relative: 2 %
HCT: 32.5 % — ABNORMAL LOW (ref 36.0–46.0)
Hemoglobin: 10.8 g/dL — ABNORMAL LOW (ref 12.0–15.0)
Immature Granulocytes: 1 %
Lymphocytes Relative: 15 %
Lymphs Abs: 1.4 K/uL (ref 0.7–4.0)
MCH: 35.6 pg — ABNORMAL HIGH (ref 26.0–34.0)
MCHC: 33.2 g/dL (ref 30.0–36.0)
MCV: 107.3 fL — ABNORMAL HIGH (ref 80.0–100.0)
Monocytes Absolute: 1.6 K/uL — ABNORMAL HIGH (ref 0.1–1.0)
Monocytes Relative: 18 %
Neutro Abs: 5.9 K/uL (ref 1.7–7.7)
Neutrophils Relative %: 63 %
Platelet Count: 594 K/uL — ABNORMAL HIGH (ref 150–400)
RBC: 3.03 MIL/uL — ABNORMAL LOW (ref 3.87–5.11)
RDW: 15.2 % (ref 11.5–15.5)
WBC Count: 9.2 K/uL (ref 4.0–10.5)
nRBC: 0 % (ref 0.0–0.2)

## 2024-02-06 LAB — IRON AND TIBC
Iron: 110 ug/dL (ref 28–170)
Saturation Ratios: 32 % — ABNORMAL HIGH (ref 10.4–31.8)
TIBC: 342 ug/dL (ref 250–450)
UIBC: 232 ug/dL

## 2024-02-06 LAB — LACTATE DEHYDROGENASE: LDH: 350 U/L — ABNORMAL HIGH (ref 98–192)

## 2024-02-06 LAB — FERRITIN: Ferritin: 157 ng/mL (ref 11–307)

## 2024-02-10 ENCOUNTER — Inpatient Hospital Stay

## 2024-02-10 ENCOUNTER — Encounter: Payer: Self-pay | Admitting: Internal Medicine

## 2024-02-10 ENCOUNTER — Other Ambulatory Visit

## 2024-02-10 ENCOUNTER — Inpatient Hospital Stay: Attending: Internal Medicine | Admitting: Internal Medicine

## 2024-02-10 VITALS — BP 124/44 | HR 60 | Temp 96.0°F | Resp 18 | Ht 62.0 in | Wt 137.2 lb

## 2024-02-10 DIAGNOSIS — N184 Chronic kidney disease, stage 4 (severe): Secondary | ICD-10-CM | POA: Diagnosis not present

## 2024-02-10 DIAGNOSIS — Z7901 Long term (current) use of anticoagulants: Secondary | ICD-10-CM | POA: Insufficient documentation

## 2024-02-10 DIAGNOSIS — D649 Anemia, unspecified: Secondary | ICD-10-CM | POA: Insufficient documentation

## 2024-02-10 DIAGNOSIS — K625 Hemorrhage of anus and rectum: Secondary | ICD-10-CM | POA: Diagnosis not present

## 2024-02-10 DIAGNOSIS — I4891 Unspecified atrial fibrillation: Secondary | ICD-10-CM | POA: Diagnosis not present

## 2024-02-10 DIAGNOSIS — D473 Essential (hemorrhagic) thrombocythemia: Secondary | ICD-10-CM | POA: Diagnosis not present

## 2024-02-10 DIAGNOSIS — D75839 Thrombocytosis, unspecified: Secondary | ICD-10-CM | POA: Insufficient documentation

## 2024-02-10 DIAGNOSIS — Z87891 Personal history of nicotine dependence: Secondary | ICD-10-CM | POA: Insufficient documentation

## 2024-02-10 NOTE — Assessment & Plan Note (Signed)
#   2019- Essential thrombocytosis-CALR positive; Intermediate risk given her age- on Hydrea  however, given worsening anemia- NOV 2024- BONE MARROW BIOPSY:  the findings are consistent with a myeloproliferative neoplasm, with essential thrombocythemia and prefibrotic primary myelofibrosis being the main diagnostic considerations.  The peripheral smear shows red cell agglutination and causes of red cell agglutination may be considered if  clinically indicated- however, Haptoglobin- NOV 2024- 81/WNL. S abdomen OCt 2024-no hepatosplenomegaly; no ascites.    #  For now continue hydrea  500 mg one day. S/p Venofer -  Platlets -590- - Continue hydrea .   # Anemia/ ? Intermittent rectal bleeding-once a month [>> colonoscopy- Dr.Andy Lamb]; Discussed re: repeating colonoscopy- JAN 2025- 17%; ferritin-41.   bone marrow- scant iron - continue  gentle iron1 pill a day- sec to GI distress recommend three times a week.  Consider erythropoietin agent along with iron  infusions if needed .   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 No nephrology evaluation. Stable.  # A.fib- on xarelto  15 mg/day [CKD; Dr.Gollan]; no concerns blood loss. - Stable  Might need to change hydrea  script  # DISPOSITION:  # HOLD venofer  today; HOLD retacrit today # follow up in 3  month-MD-labs; cbc/cmp/LDH; iron  studies; ferritin- possible venofer  or retacrit-- -Dr.B

## 2024-02-10 NOTE — Progress Notes (Signed)
 Patient has no new or acute concerns at this time.

## 2024-02-10 NOTE — Progress Notes (Signed)
 [facility] CONSULT NOTE  Patient Care Team: Diedra Lame, MD as PCP - General (Family Medicine) Perla Evalene PARAS, MD as PCP - Cardiology (Cardiology) Perla Evalene PARAS, MD as Consulting Physician (Cardiology) Rennie Cindy SAUNDERS, MD as Consulting Physician (Oncology)  CHIEF COMPLAINTS/PURPOSE OF CONSULTATION: ET  Oncology History Overview Note  # ESSENTIAL THROMBOCYTOSIS [name] 2019- 684; July 2020- platelets-992 ; Hb-12 white count- 9];INTERMEDIATE RISK;July 2020-; CALR MUTATION POSITIVE; July 2020- Hydrea  500 mg/day.   # A.fib on xarelto  [A.fib]  # Chronic Kidney disease-III   Essential thrombocytosis (HCC)  01/30/2019 Initial Diagnosis   Essential thrombocytosis (HCC)    HISTORY OF PRESENTING ILLNESS: Alone. Ambulating independently.  Paula Clark 83 y.o.  female pleasant patient with  myeloproliferative neoplasm, with essential thrombocythemia and prefibrotic primary myelofibrosis most recently on Hydrea ; CKD stage III A-fib on Eliquis, CHF is here for follow-up.   Pt states she goes from having constipation to diarrhea with iron .  Patient has been taking about 3 times a week.     Denies any rectal bleeding at this time. No swelling in legs.  She is current on Lasix  as per cardiology.  Review of Systems  Constitutional:  Positive for malaise/fatigue. Negative for chills, diaphoresis, fever and weight loss.  HENT:  Negative for nosebleeds and sore throat.   Eyes:  Negative for double vision.  Respiratory:  Positive for cough and shortness of breath. Negative for hemoptysis, sputum production and wheezing.   Cardiovascular:  Negative for chest pain, palpitations, orthopnea and leg swelling.  Gastrointestinal:  Positive for diarrhea. Negative for abdominal pain, blood in stool, constipation, heartburn, melena, nausea and vomiting.  Genitourinary:  Negative for dysuria, frequency and urgency.  Musculoskeletal:  Positive for joint pain. Negative for back  pain.  Skin: Negative.  Negative for itching and rash.  Neurological:  Negative for dizziness, tingling, focal weakness, weakness and headaches.  Endo/Heme/Allergies:  Does not bruise/bleed easily.  Psychiatric/Behavioral:  Negative for depression. The patient is not nervous/anxious and does not have insomnia.      MEDICAL HISTORY:  Past Medical History:  Diagnosis Date   Chronic atrial fibrillation (HCC)    a. Unsuccessful DCCV 05/2016; b. CHADS2VASc => 4 (HTN, age x 2, female)--> Xarelto .   CKD (chronic kidney disease), stage III (HCC)    Epistaxis    Gout    History of stress test    a. 04/2016 MV: EF 80%, no ischemia.   HLD (hyperlipidemia)    Hypertension    Mitral regurgitation    a. Echo 04/2016: EF 60-65%, no RWMA, mild AI, mild MR, mild biatrial enlargement, PASP 32 mmHg, trivial pericardial effusion noted posterior to the heart   Syncope     SURGICAL HISTORY: Past Surgical History:  Procedure Laterality Date   ABDOMINAL HYSTERECTOMY     APPENDECTOMY     ELECTROPHYSIOLOGIC STUDY N/A 05/18/2016   Procedure: CARDIOVERSION;  Surgeon: Evalene PARAS Perla, MD;  Location: [facility] ORS;  Service: Cardiovascular;  Laterality: N/A;   IR BONE MARROW BIOPSY & ASPIRATION  05/18/2023   OOPHORECTOMY     TONSILLECTOMY     TOTAL VAGINAL HYSTERECTOMY      SOCIAL HISTORY: Social History   Socioeconomic History   Marital status: Widowed    Spouse name: Not on file   Number of children: Not on file   Years of education: Not on file   Highest education level: Not on file  Occupational History   Not on file  Tobacco Use  Smoking status: Former   Smokeless tobacco: Never  Vaping Use   Vaping status: Never Used  Substance and Sexual Activity   Alcohol use: No   Drug use: No   Sexual activity: Not on file  Other Topics Concern   Not on file  Social History Narrative   [name]; self; remote smoking; No alcohol; 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    Social Drivers of Manufacturing engineer Strain: Low Risk  (01/27/2023)   Received from [facility]   Overall Financial Resource Strain (CARDIA)    Difficulty of Paying Living Expenses: Not hard at all  Food Insecurity: No Food Insecurity (01/27/2023)   Received from [facility]   Hunger Vital Sign    Within the past 12 months, you worried that your food would run out before you got the money to buy more.: Never true    Within the past 12 months, the food you bought just didn't last and you didn't have money to get more.: Never true  Transportation Needs: No Transportation Needs (01/27/2023)   Received from [facility] - Transportation    In the past 12 months, has lack of transportation kept you from medical appointments or from getting medications?: No    Lack of Transportation (Non-Medical): No  Physical Activity: Not on file  Stress: Not on file  Social Connections: Not on file  Intimate Partner Violence: Not on file    FAMILY HISTORY: Family History  Problem Relation Age of Onset   Heart failure Mother    Atrial fibrillation Brother     ALLERGIES:  is allergic to codeine.  MEDICATIONS:  Current Outpatient Medications  Medication Sig Dispense Refill   acetaminophen  (TYLENOL ) 500 MG tablet Take 500-1,000 mg by mouth every 6 (six) hours as needed for moderate pain or headache.     cetirizine (ZYRTEC) 10 MG tablet Take 10 mg by mouth daily.     Cholecalciferol (VITAMIN D3) 1000 units CAPS Take 1,000 Units by mouth daily.     colchicine 0.6 MG tablet  (Patient not taking: Reported on 02/10/2024)     diltiazem  (CARDIZEM  CD) 240 MG 24 hr capsule TAKE 1 CAPSULE BY MOUTH EVERY DAY 90 capsule 3   doxazosin  (CARDURA ) 8 MG tablet TAKE 1/2 TAB (4 MG) TABLET IN THE AM AND 1/2 TAB (4 MG) IN THE EVENING 90 tablet 0   furosemide  (LASIX ) 20 MG tablet TAKE 1 TABLET BY MOUTH MON,WED,FRI, AND SAT 60 tablet 1   hydroxyurea  (HYDREA ) 500 MG capsule TAKE 1  CAPSULE BY MOUTH TWICE A DAY 180 capsule 1   loperamide (IMODIUM) 2 MG capsule Take 2 mg by mouth as needed for diarrhea or loose stools.     losartan (COZAAR) 25 MG tablet Take 25 mg by mouth daily.     ondansetron  (ZOFRAN ) 4 MG tablet Take 1 tablet (4 mg total) by mouth every 8 (eight) hours as needed for nausea or vomiting. 20 tablet 0   potassium chloride  (KLOR-CON ) 10 MEQ tablet TAKE 1 TABLET BY MOUTH MON,WED,FRI, AND SAT WITH FUROSEMIDE  48 tablet 3   Rivaroxaban  (XARELTO ) 15 MG TABS tablet Take 1 tablet (15 mg total) by mouth daily with supper. 90 tablet 3   No current facility-administered medications for this visit.     PHYSICAL EXAMINATION:   Vitals:   02/10/24 1517  BP: (!) 124/44  Pulse: 60  Resp: 18  Temp: (!) 96 F (35.6 C)  SpO2: 97%      Filed Weights   02/10/24 1517  Weight: 137 lb 3.2 oz (62.2 kg)       Positive for bilateral crackles at the bases. Physical Exam HENT:     Head: Normocephalic and atraumatic.     Mouth/Throat:     Pharynx: No oropharyngeal exudate.  Eyes:     Pupils: Pupils are equal, round, and reactive to light.  Cardiovascular:     Rate and Rhythm: Normal rate and regular rhythm.     Heart sounds: Murmur heard.  Pulmonary:     Effort: Pulmonary effort is normal. No respiratory distress.     Breath sounds: Normal breath sounds. No wheezing.  Abdominal:     General: Bowel sounds are normal. There is no distension.     Palpations: Abdomen is soft. There is no mass.     Tenderness: There is no abdominal tenderness. There is no guarding or rebound.  Musculoskeletal:        General: No tenderness. Normal range of motion.     Cervical back: Normal range of motion and neck supple.  Skin:    General: Skin is warm.  Neurological:     Mental Status: She is alert and oriented to person, place, and time.  Psychiatric:        Mood and Affect: Affect normal.    LABORATORY DATA:  I have reviewed the data as listed Lab Results   Component Value Date   WBC 9.2 02/06/2024   HGB 10.8 (L) 02/06/2024   HCT 32.5 (L) 02/06/2024   MCV 107.3 (H) 02/06/2024   PLT 594 (H) 02/06/2024   Recent Labs    09/12/23 1403 11/11/23 1439 02/06/24 1422  NA 138 137 136  K 3.9 4.5 3.7  CL 106 105 104  CO2 26 24 24   GLUCOSE 138* 136* 110*  BUN 25* 25* 23  CREATININE 1.42* 1.30* 1.41*  CALCIUM  9.3 9.2 9.2  GFRNONAA 36* 40* 37*  PROT 6.9 7.1 6.9  ALBUMIN 3.8 3.8 4.0  AST 19 20 23   ALT 15 14 19   ALKPHOS 62 74 69  BILITOT 0.8 0.6 0.8     No results found.  ASSESSMENT & PLAN:   Essential thrombocytosis (HCC) # 2019- Essential thrombocytosis-CALR positive; Intermediate risk given her age- on Hydrea  however, given worsening anemia- NOV 2024- BONE MARROW BIOPSY:  the findings are consistent with a myeloproliferative neoplasm, with essential thrombocythemia and prefibrotic primary myelofibrosis being the main diagnostic considerations.  The peripheral smear shows red cell agglutination and causes of red cell agglutination may be considered if  clinically indicated- however, Haptoglobin- NOV 2024- 81/WNL. S abdomen OCt 2024-no hepatosplenomegaly; no ascites.    #  For now continue hydrea  500 mg one day. S/p Venofer -  Platlets -590- - Continue hydrea .   # Anemia/ ? Intermittent rectal bleeding-once a month [>> colonoscopy- Dr.Andy Lamb]; Discussed re: repeating colonoscopy- JAN 2025- 17%; ferritin-41.   bone marrow- scant iron - continue  gentle iron1 pill a day- sec to GI distress recommend three times a week.  Consider erythropoietin agent along with iron  infusions if needed .   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 No nephrology evaluation. Stable.  # A.fib- on xarelto  15 mg/day [CKD; Dr.Gollan]; no concerns blood loss. - Stable  Might need to change hydrea  script  # DISPOSITION:   #  HOLD venofer  today; HOLD retacrit today # follow up in 3  month-MD-labs; cbc/cmp/LDH; iron  studies; ferritin- possible venofer  or retacrit-- -Dr.B         Cindy JONELLE Joe, MD 02/10/2024 3:34 PM

## 2024-02-13 ENCOUNTER — Other Ambulatory Visit

## 2024-02-23 DIAGNOSIS — Z79899 Other long term (current) drug therapy: Secondary | ICD-10-CM | POA: Diagnosis not present

## 2024-02-23 DIAGNOSIS — N1832 Chronic kidney disease, stage 3b: Secondary | ICD-10-CM | POA: Diagnosis not present

## 2024-02-23 DIAGNOSIS — I10 Essential (primary) hypertension: Secondary | ICD-10-CM | POA: Diagnosis not present

## 2024-03-01 DIAGNOSIS — Z0000 Encounter for general adult medical examination without abnormal findings: Secondary | ICD-10-CM | POA: Diagnosis not present

## 2024-03-01 DIAGNOSIS — Z1331 Encounter for screening for depression: Secondary | ICD-10-CM | POA: Diagnosis not present

## 2024-05-11 ENCOUNTER — Inpatient Hospital Stay: Attending: Internal Medicine

## 2024-05-11 DIAGNOSIS — D649 Anemia, unspecified: Secondary | ICD-10-CM | POA: Insufficient documentation

## 2024-05-11 DIAGNOSIS — D473 Essential (hemorrhagic) thrombocythemia: Secondary | ICD-10-CM | POA: Diagnosis not present

## 2024-05-11 LAB — CMP (CANCER CENTER ONLY)
ALT: 13 U/L (ref 0–44)
AST: 19 U/L (ref 15–41)
Albumin: 4 g/dL (ref 3.5–5.0)
Alkaline Phosphatase: 64 U/L (ref 38–126)
Anion gap: 10 (ref 5–15)
BUN: 25 mg/dL — ABNORMAL HIGH (ref 8–23)
CO2: 23 mmol/L (ref 22–32)
Calcium: 9.2 mg/dL (ref 8.9–10.3)
Chloride: 105 mmol/L (ref 98–111)
Creatinine: 1.68 mg/dL — ABNORMAL HIGH (ref 0.44–1.00)
GFR, Estimated: 30 mL/min — ABNORMAL LOW
Glucose, Bld: 100 mg/dL — ABNORMAL HIGH (ref 70–99)
Potassium: 4.3 mmol/L (ref 3.5–5.1)
Sodium: 138 mmol/L (ref 135–145)
Total Bilirubin: 0.8 mg/dL (ref 0.0–1.2)
Total Protein: 7.1 g/dL (ref 6.5–8.1)

## 2024-05-11 LAB — CBC WITH DIFFERENTIAL (CANCER CENTER ONLY)
Abs Immature Granulocytes: 0.09 K/uL — ABNORMAL HIGH (ref 0.00–0.07)
Basophils Absolute: 0.1 K/uL (ref 0.0–0.1)
Basophils Relative: 1 %
Eosinophils Absolute: 0.2 K/uL (ref 0.0–0.5)
Eosinophils Relative: 2 %
HCT: 32.5 % — ABNORMAL LOW (ref 36.0–46.0)
Hemoglobin: 10.6 g/dL — ABNORMAL LOW (ref 12.0–15.0)
Immature Granulocytes: 1 %
Lymphocytes Relative: 18 %
Lymphs Abs: 1.5 K/uL (ref 0.7–4.0)
MCH: 35.3 pg — ABNORMAL HIGH (ref 26.0–34.0)
MCHC: 32.6 g/dL (ref 30.0–36.0)
MCV: 108.3 fL — ABNORMAL HIGH (ref 80.0–100.0)
Monocytes Absolute: 1.6 K/uL — ABNORMAL HIGH (ref 0.1–1.0)
Monocytes Relative: 18 %
Neutro Abs: 5.2 K/uL (ref 1.7–7.7)
Neutrophils Relative %: 60 %
Platelet Count: 580 K/uL — ABNORMAL HIGH (ref 150–400)
RBC: 3 MIL/uL — ABNORMAL LOW (ref 3.87–5.11)
RDW: 15.3 % (ref 11.5–15.5)
WBC Count: 8.5 K/uL (ref 4.0–10.5)
nRBC: 0 % (ref 0.0–0.2)

## 2024-05-11 LAB — IRON AND TIBC
Iron: 84 ug/dL (ref 28–170)
Saturation Ratios: 25 % (ref 10.4–31.8)
TIBC: 336 ug/dL (ref 250–450)
UIBC: 252 ug/dL

## 2024-05-11 LAB — FERRITIN: Ferritin: 176 ng/mL (ref 11–307)

## 2024-05-11 LAB — LACTATE DEHYDROGENASE: LDH: 330 U/L — ABNORMAL HIGH (ref 98–192)

## 2024-05-16 ENCOUNTER — Inpatient Hospital Stay: Attending: Internal Medicine | Admitting: Nurse Practitioner

## 2024-05-16 ENCOUNTER — Encounter: Payer: Self-pay | Admitting: Nurse Practitioner

## 2024-05-16 ENCOUNTER — Inpatient Hospital Stay

## 2024-05-16 VITALS — BP 175/66 | HR 70 | Temp 97.9°F | Resp 16 | Wt 139.0 lb

## 2024-05-16 DIAGNOSIS — Z7901 Long term (current) use of anticoagulants: Secondary | ICD-10-CM | POA: Insufficient documentation

## 2024-05-16 DIAGNOSIS — D473 Essential (hemorrhagic) thrombocythemia: Secondary | ICD-10-CM | POA: Diagnosis not present

## 2024-05-16 DIAGNOSIS — D509 Iron deficiency anemia, unspecified: Secondary | ICD-10-CM

## 2024-05-16 DIAGNOSIS — K529 Noninfective gastroenteritis and colitis, unspecified: Secondary | ICD-10-CM | POA: Diagnosis not present

## 2024-05-16 DIAGNOSIS — K649 Unspecified hemorrhoids: Secondary | ICD-10-CM | POA: Insufficient documentation

## 2024-05-16 DIAGNOSIS — D751 Secondary polycythemia: Secondary | ICD-10-CM

## 2024-05-16 DIAGNOSIS — I4891 Unspecified atrial fibrillation: Secondary | ICD-10-CM | POA: Insufficient documentation

## 2024-05-16 DIAGNOSIS — N184 Chronic kidney disease, stage 4 (severe): Secondary | ICD-10-CM | POA: Insufficient documentation

## 2024-05-16 DIAGNOSIS — I10 Essential (primary) hypertension: Secondary | ICD-10-CM | POA: Diagnosis not present

## 2024-05-16 DIAGNOSIS — D75839 Thrombocytosis, unspecified: Secondary | ICD-10-CM | POA: Insufficient documentation

## 2024-05-16 DIAGNOSIS — D649 Anemia, unspecified: Secondary | ICD-10-CM | POA: Insufficient documentation

## 2024-05-16 NOTE — Progress Notes (Signed)
 Patient here for venofer  infusion. With NP, bp 175/66. BP was rechecked when she came to the infusion room and bp was 170/;65. NP notified pts bp remains elevated. Per NP, check bp again. Second recheck was 175/74. NP notified and ordered to hold venofer  today and reschedule appt. Pt educated to monitor bp at home and keep a log. Pt stated she already had a bp machine at home. Pt also educated if it remains elevated at home to notify her PCP. Pt also educated to seek emergency care if she became symptomatic of high bp including but not limited to blurry vision, headache, or chest pain. Pt verified understanding and was stable at discharge. Pt was taken to scheduling to reschedule this appointment.

## 2024-05-16 NOTE — Progress Notes (Signed)
 [facility] CONSULT NOTE  Patient Care Team: Diedra Lame, MD as PCP - General (Family Medicine) Perla Evalene PARAS, MD as PCP - Cardiology (Cardiology) Perla Evalene PARAS, MD as Consulting Physician (Cardiology) Rennie Cindy SAUNDERS, MD as Consulting Physician (Oncology)  CHIEF COMPLAINTS/PURPOSE OF CONSULTATION: ET  Oncology History Overview Note  # ESSENTIAL THROMBOCYTOSIS [name] 2019- 684; July 2020- platelets-992 ; Hb-12 white count- 9];INTERMEDIATE RISK;July 2020-; CALR MUTATION POSITIVE; July 2020- Hydrea  500 mg/day.   # A.fib on xarelto  [A.fib]  # Chronic Kidney disease-III   Essential thrombocytosis (HCC)  01/30/2019 Initial Diagnosis   Essential thrombocytosis (HCC)    HISTORY OF PRESENTING ILLNESS: Alone. Ambulating independently.  Paula Clark 83 y.o.  female pleasant patient with  myeloproliferative neoplasm, with essential thrombocythemia and prefibrotic primary myelofibrosis most recently on Hydrea ; CKD stage III A-fib on Eliquis, CHF is here for follow-up.  Patient here today overall reports doing well.  She does report ongoing intermittent diarrhea.  She also reports intermittent painful hemorrhoids that bleed about once to twice a week.  She states that she cannot take oral iron  it causes diarrhea and she has not been taking it at all at home.  She does endorse some fatigue but denies any dizziness or shortness of breath.  During today's visit we discussed possibly seeing someone in GI for for her chronic diarrhea and hemorrhoids.  She was not interested in a referral today however states that she will think about it and possibly be interested next visit.  We will revisit this.   No swelling in legs.  She is current on Lasix  as per cardiology.  Review of Systems  Constitutional:  Positive for malaise/fatigue. Negative for chills, diaphoresis, fever and weight loss.  HENT:  Negative for nosebleeds and sore throat.   Eyes:  Negative for double vision.   Respiratory:  Negative for cough, hemoptysis, sputum production, shortness of breath and wheezing.   Cardiovascular:  Negative for chest pain, palpitations, orthopnea and leg swelling.  Gastrointestinal:  Positive for diarrhea. Negative for abdominal pain, blood in stool, constipation, heartburn, melena, nausea and vomiting.  Genitourinary:  Negative for dysuria, frequency and urgency.  Musculoskeletal:  Positive for joint pain. Negative for back pain.  Skin: Negative.  Negative for itching and rash.  Neurological:  Negative for dizziness, tingling, focal weakness, weakness and headaches.  Endo/Heme/Allergies:  Does not bruise/bleed easily.  Psychiatric/Behavioral:  Negative for depression. The patient is not nervous/anxious and does not have insomnia.      MEDICAL HISTORY:  Past Medical History:  Diagnosis Date   Chronic atrial fibrillation (HCC)    a. Unsuccessful DCCV 05/2016; b. CHADS2VASc => 4 (HTN, age x 2, female)--> Xarelto .   CKD (chronic kidney disease), stage III (HCC)    Epistaxis    Gout    History of stress test    a. 04/2016 MV: EF 80%, no ischemia.   HLD (hyperlipidemia)    Hypertension    Mitral regurgitation    a. Echo 04/2016: EF 60-65%, no RWMA, mild AI, mild MR, mild biatrial enlargement, PASP 32 mmHg, trivial pericardial effusion noted posterior to the heart   Syncope     SURGICAL HISTORY: Past Surgical History:  Procedure Laterality Date   ABDOMINAL HYSTERECTOMY     APPENDECTOMY     ELECTROPHYSIOLOGIC STUDY N/A 05/18/2016   Procedure: CARDIOVERSION;  Surgeon: Evalene PARAS Perla, MD;  Location: [facility] ORS;  Service: Cardiovascular;  Laterality: N/A;   IR BONE MARROW BIOPSY & ASPIRATION  05/18/2023   OOPHORECTOMY     TONSILLECTOMY     TOTAL VAGINAL HYSTERECTOMY      SOCIAL HISTORY: Social History   Socioeconomic History   Marital status: Widowed    Spouse name: Not on file   Number of children: Not on file   Years of education: Not on file   Highest  education level: Not on file  Occupational History   Not on file  Tobacco Use   Smoking status: Former   Smokeless tobacco: Never  Vaping Use   Vaping status: Never Used  Substance and Sexual Activity   Alcohol use: No   Drug use: No   Sexual activity: Not on file  Other Topics Concern   Not on file  Social History Narrative   [name]; self; remote smoking; No alcohol; 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    Social Drivers of Corporate Investment Banker Strain: Low Risk  (03/01/2024)   Received from [facility]   Overall Financial Resource Strain (CARDIA)    Difficulty of Paying Living Expenses: Not very hard  Food Insecurity: No Food Insecurity (03/01/2024)   Received from [facility]   Hunger Vital Sign    Within the past 12 months, you worried that your food would run out before you got the money to buy more.: Never true    Within the past 12 months, the food you bought just didn't last and you didn't have money to get more.: Never true  Transportation Needs: No Transportation Needs (03/01/2024)   Received from [facility] - Transportation    In the past 12 months, has lack of transportation kept you from medical appointments or from getting medications?: No    Lack of Transportation (Non-Medical): No  Physical Activity: Not on file  Stress: Not on file  Social Connections: Not on file  Intimate Partner Violence: Not on file    FAMILY HISTORY: Family History  Problem Relation Age of Onset   Heart failure Mother    Atrial fibrillation Brother     ALLERGIES:  is allergic to codeine.  MEDICATIONS:  Current Outpatient Medications  Medication Sig Dispense Refill   acetaminophen  (TYLENOL ) 500 MG tablet Take 500-1,000 mg by mouth every 6 (six) hours as needed for moderate pain or headache.     cetirizine (ZYRTEC) 10 MG tablet Take 10 mg by mouth daily.     Cholecalciferol (VITAMIN D3) 1000 units CAPS Take  1,000 Units by mouth daily.     diltiazem  (CARDIZEM  CD) 240 MG 24 hr capsule TAKE 1 CAPSULE BY MOUTH EVERY DAY 90 capsule 3   doxazosin  (CARDURA ) 8 MG tablet TAKE 1/2 TAB (4 MG) TABLET IN THE AM AND 1/2 TAB (4 MG) IN THE EVENING 90 tablet 0   furosemide  (LASIX ) 20 MG tablet TAKE 1 TABLET BY MOUTH MON,WED,FRI, AND SAT 60 tablet 1   hydroxyurea  (HYDREA ) 500 MG capsule TAKE 1 CAPSULE BY MOUTH TWICE A DAY 180 capsule 1   loperamide (IMODIUM) 2 MG capsule Take 2 mg by mouth as needed for diarrhea or loose stools.     losartan (COZAAR) 25 MG tablet Take 25 mg by mouth daily.     ondansetron  (ZOFRAN ) 4 MG tablet Take 1 tablet (4 mg total) by mouth every 8 (eight) hours as needed for nausea or vomiting. 20 tablet 0   potassium chloride  (KLOR-CON ) 10 MEQ tablet TAKE 1 TABLET BY MOUTH MON,WED,FRI, AND SAT WITH FUROSEMIDE  48  tablet 3   Rivaroxaban  (XARELTO ) 15 MG TABS tablet Take 1 tablet (15 mg total) by mouth daily with supper. 90 tablet 3   colchicine 0.6 MG tablet  (Patient not taking: Reported on 02/10/2024)     No current facility-administered medications for this visit.     PHYSICAL EXAMINATION:   Vitals:   05/16/24 1355 05/16/24 1358  BP: (!) 175/66 (!) 175/66  Pulse: 70   Resp: 16   Temp: 97.9 F (36.6 C)   SpO2: 94%       Filed Weights   05/16/24 1355  Weight: 139 lb (63 kg)       Positive for bilateral crackles at the bases. Physical Exam HENT:     Head: Normocephalic and atraumatic.     Mouth/Throat:     Pharynx: No oropharyngeal exudate.  Eyes:     Pupils: Pupils are equal, round, and reactive to light.  Cardiovascular:     Rate and Rhythm: Normal rate and regular rhythm.     Heart sounds: Murmur heard.  Pulmonary:     Effort: Pulmonary effort is normal. No respiratory distress.     Breath sounds: Normal breath sounds. No wheezing.  Abdominal:     General: Bowel sounds are normal. There is no distension.     Palpations: Abdomen is soft. There is no mass.      Tenderness: There is no abdominal tenderness. There is no guarding or rebound.  Musculoskeletal:        General: No tenderness. Normal range of motion.     Cervical back: Normal range of motion and neck supple.  Skin:    General: Skin is warm.  Neurological:     Mental Status: She is alert and oriented to person, place, and time.  Psychiatric:        Mood and Affect: Affect normal.    LABORATORY DATA:  I have reviewed the data as listed Lab Results  Component Value Date   WBC 8.5 05/11/2024   HGB 10.6 (L) 05/11/2024   HCT 32.5 (L) 05/11/2024   MCV 108.3 (H) 05/11/2024   PLT 580 (H) 05/11/2024   Recent Labs    11/11/23 1439 02/06/24 1422 05/11/24 1401  NA 137 136 138  K 4.5 3.7 4.3  CL 105 104 105  CO2 24 24 23   GLUCOSE 136* 110* 100*  BUN 25* 23 25*  CREATININE 1.30* 1.41* 1.68*  CALCIUM  9.2 9.2 9.2  GFRNONAA 40* 37* 30*  PROT 7.1 6.9 7.1  ALBUMIN 3.8 4.0 4.0  AST 20 23 19   ALT 14 19 13   ALKPHOS 74 69 64  BILITOT 0.6 0.8 0.8     No results found.  ASSESSMENT & PLAN:    Essential thrombocytosis (HCC) # 2019- Essential thrombocytosis-CALR positive; Intermediate risk given her age- on Hydrea  however, given worsening anemia- NOV 2024- BONE MARROW BIOPSY:  the findings are consistent with a myeloproliferative neoplasm, with essential thrombocythemia and prefibrotic primary myelofibrosis being the main diagnostic considerations.  The peripheral smear shows red cell agglutination and causes of red cell agglutination may be considered if  clinically indicated- however, Haptoglobin- NOV 2024- 81/WNL. S abdomen OCt 2024-no hepatosplenomegaly; no ascites.     #  For now continue hydrea  500 mg one day. Platlets -580- - Continue hydrea .    # Anemia/ ? Intermittent rectal bleeding-once a month [>> colonoscopy- Dr.Andy Lamb]; Discussed re: repeating colonoscopy- JAN 2025- 17%; ferritin-41.   bone marrow- scant iron - unable to tolerate PO iron  causes  worsening diarrhea.   Consider erythropoietin agent along with iron  infusions if needed .    Today- hb 10.6- proceed venofer  if blood pressure improves, if not reschedule for 1 dose of venofer  HOLD retacrit   # CKD stage III-IV[GFR-]- on diuretic- slowly worse at GFR  30- defer to PCP/card [Dr.Gollan]; JAN 2024- US  kidneys - negative for obstruction. No nephrology evaluation.  Encouraged patient to increase hydration   # A.fib- on xarelto  15 mg/day [CKD; Dr.Gollan]; no concerns blood loss. - Stable  # Chronic diarrhea/hemorrhoids-  Patient reports a long history of intermittent diarrhea with intermittent bleeding hemorrhoids.  Offered GI referral.  She reports that she wants to think about it at this time.  #Hypertension-patient was hypertensive in clinic today with a blood pressure of 176/66.  She reports taking her blood pressure medication late today only about an hour before her appointment.  We will recheck her blood pressure if it does not improve we will not proceed with Venofer  today as this could make her blood pressure worse.  Advised her to follow-up soon with PCP and keep a record of her blood pressure to ensure adequate blood pressure control at home.  If blood pressure does not improve we will reschedule her Venofer  for another day.    Plan: Proceed with 200 mg venofer  today if blood pressure improves reschedule venofer  if needed Monitor blood pressure and record at home contact PCP with any ongoing HTN for better control F/U in 3 months see MP/NP with cbc/cmp/ferritin/Iron  and TIBC/LDH prior to visit lp     Morna Husband, NP 05/16/2024 3:02 PM

## 2024-05-17 ENCOUNTER — Other Ambulatory Visit: Payer: Self-pay | Admitting: *Deleted

## 2024-05-17 DIAGNOSIS — D509 Iron deficiency anemia, unspecified: Secondary | ICD-10-CM

## 2024-05-18 ENCOUNTER — Telehealth: Payer: Self-pay | Admitting: Internal Medicine

## 2024-05-18 NOTE — Telephone Encounter (Signed)
 Pt called to cancel her iron  appt on 11/10 and not r/s due to her blood pressure. Appt canceled and noted.   Routing to medical team in case more needs to be done due to blood pressure.

## 2024-05-21 ENCOUNTER — Inpatient Hospital Stay

## 2024-06-04 ENCOUNTER — Telehealth: Payer: Self-pay | Admitting: Cardiovascular Disease

## 2024-06-04 NOTE — Telephone Encounter (Signed)
*  STAT* If patient is at the pharmacy, call can be transferred to refill team.   1. Which medications need to be refilled? (please list name of each medication and dose if known) doxazosin  (CARDURA ) 8 MG tablet    2. Would you like to learn more about the convenience, safety, & potential cost savings by using the [facility]? No   3. Are you open to using the [facility] (Type [facility].) No   4. Which pharmacy/location (including street and city if local pharmacy) is medication to be sent to?  [id number] - [place], [place] - [address]   5. Do they need a 30 day or 90 day supply? 90 day  Pt was seen on 01/23/24 and is out of medication

## 2024-06-05 MED ORDER — DOXAZOSIN MESYLATE 8 MG PO TABS: ORAL_TABLET | ORAL | 3 refills | Status: AC

## 2024-06-05 NOTE — Telephone Encounter (Signed)
 Refill sent.

## 2024-08-09 ENCOUNTER — Other Ambulatory Visit: Payer: Self-pay | Admitting: Cardiovascular Disease

## 2024-08-15 ENCOUNTER — Telehealth: Payer: Self-pay | Admitting: Internal Medicine

## 2024-08-15 NOTE — Telephone Encounter (Signed)
 pt called to cancel appts for now, will call back when the weather is better (lab 2/5) (MD 2/10) appts. Canceled and noted

## 2024-08-16 ENCOUNTER — Inpatient Hospital Stay

## 2024-08-21 ENCOUNTER — Inpatient Hospital Stay: Admitting: Internal Medicine
